# Patient Record
Sex: Female | Born: 1940
Health system: Southern US, Community
[De-identification: ages and names within clinical notes are randomized; demographics above are authoritative.]

## PROBLEM LIST (undated history)

## (undated) DIAGNOSIS — I639 Cerebral infarction, unspecified: Secondary | ICD-10-CM

## (undated) DIAGNOSIS — I10 Essential (primary) hypertension: Secondary | ICD-10-CM

## (undated) DIAGNOSIS — H269 Unspecified cataract: Secondary | ICD-10-CM

## (undated) DIAGNOSIS — I671 Cerebral aneurysm, nonruptured: Secondary | ICD-10-CM

## (undated) HISTORY — DX: Unspecified cataract: H26.9

## (undated) HISTORY — PX: ABDOMINAL HERNIA REPAIR: SHX539

## (undated) HISTORY — PX: CHOLECYSTECTOMY: SHX55

## (undated) HISTORY — PX: TOTAL ABDOMINAL HYSTERECTOMY: SHX209

## (undated) HISTORY — PX: REPLACEMENT TOTAL KNEE: SUR1224

---

## 2011-09-30 DIAGNOSIS — K8020 Calculus of gallbladder without cholecystitis without obstruction: Secondary | ICD-10-CM | POA: Diagnosis not present

## 2011-09-30 DIAGNOSIS — R112 Nausea with vomiting, unspecified: Secondary | ICD-10-CM | POA: Diagnosis not present

## 2011-09-30 DIAGNOSIS — R109 Unspecified abdominal pain: Secondary | ICD-10-CM | POA: Diagnosis not present

## 2011-09-30 DIAGNOSIS — I10 Essential (primary) hypertension: Secondary | ICD-10-CM | POA: Diagnosis not present

## 2011-09-30 DIAGNOSIS — R1011 Right upper quadrant pain: Secondary | ICD-10-CM | POA: Diagnosis not present

## 2011-10-04 DIAGNOSIS — G4700 Insomnia, unspecified: Secondary | ICD-10-CM | POA: Diagnosis not present

## 2011-10-04 DIAGNOSIS — M549 Dorsalgia, unspecified: Secondary | ICD-10-CM | POA: Diagnosis not present

## 2011-10-04 DIAGNOSIS — R109 Unspecified abdominal pain: Secondary | ICD-10-CM | POA: Diagnosis not present

## 2011-10-04 DIAGNOSIS — F329 Major depressive disorder, single episode, unspecified: Secondary | ICD-10-CM | POA: Diagnosis not present

## 2011-10-04 DIAGNOSIS — F3289 Other specified depressive episodes: Secondary | ICD-10-CM | POA: Diagnosis not present

## 2011-10-10 DIAGNOSIS — K811 Chronic cholecystitis: Secondary | ICD-10-CM | POA: Diagnosis not present

## 2011-10-10 DIAGNOSIS — K8020 Calculus of gallbladder without cholecystitis without obstruction: Secondary | ICD-10-CM | POA: Diagnosis not present

## 2011-10-21 DIAGNOSIS — K824 Cholesterolosis of gallbladder: Secondary | ICD-10-CM | POA: Diagnosis not present

## 2011-10-21 DIAGNOSIS — Z7982 Long term (current) use of aspirin: Secondary | ICD-10-CM | POA: Diagnosis not present

## 2011-10-21 DIAGNOSIS — Z90710 Acquired absence of both cervix and uterus: Secondary | ICD-10-CM | POA: Diagnosis not present

## 2011-10-21 DIAGNOSIS — Z96659 Presence of unspecified artificial knee joint: Secondary | ICD-10-CM | POA: Diagnosis not present

## 2011-10-21 DIAGNOSIS — K8000 Calculus of gallbladder with acute cholecystitis without obstruction: Secondary | ICD-10-CM | POA: Diagnosis not present

## 2011-10-21 DIAGNOSIS — K8020 Calculus of gallbladder without cholecystitis without obstruction: Secondary | ICD-10-CM | POA: Diagnosis not present

## 2011-10-21 DIAGNOSIS — K811 Chronic cholecystitis: Secondary | ICD-10-CM | POA: Diagnosis not present

## 2011-10-21 DIAGNOSIS — K8010 Calculus of gallbladder with chronic cholecystitis without obstruction: Secondary | ICD-10-CM | POA: Diagnosis not present

## 2011-10-21 DIAGNOSIS — I10 Essential (primary) hypertension: Secondary | ICD-10-CM | POA: Diagnosis not present

## 2011-12-05 DIAGNOSIS — M129 Arthropathy, unspecified: Secondary | ICD-10-CM | POA: Diagnosis not present

## 2011-12-05 DIAGNOSIS — M2550 Pain in unspecified joint: Secondary | ICD-10-CM | POA: Diagnosis not present

## 2011-12-05 DIAGNOSIS — N390 Urinary tract infection, site not specified: Secondary | ICD-10-CM | POA: Diagnosis not present

## 2011-12-05 DIAGNOSIS — I10 Essential (primary) hypertension: Secondary | ICD-10-CM | POA: Diagnosis not present

## 2011-12-05 DIAGNOSIS — E878 Other disorders of electrolyte and fluid balance, not elsewhere classified: Secondary | ICD-10-CM | POA: Diagnosis not present

## 2011-12-05 DIAGNOSIS — J069 Acute upper respiratory infection, unspecified: Secondary | ICD-10-CM | POA: Diagnosis not present

## 2011-12-05 DIAGNOSIS — E785 Hyperlipidemia, unspecified: Secondary | ICD-10-CM | POA: Diagnosis not present

## 2011-12-05 DIAGNOSIS — R5381 Other malaise: Secondary | ICD-10-CM | POA: Diagnosis not present

## 2011-12-05 DIAGNOSIS — R109 Unspecified abdominal pain: Secondary | ICD-10-CM | POA: Diagnosis not present

## 2011-12-09 DIAGNOSIS — R918 Other nonspecific abnormal finding of lung field: Secondary | ICD-10-CM | POA: Diagnosis not present

## 2011-12-09 DIAGNOSIS — Z09 Encounter for follow-up examination after completed treatment for conditions other than malignant neoplasm: Secondary | ICD-10-CM | POA: Diagnosis not present

## 2011-12-10 DIAGNOSIS — Z780 Asymptomatic menopausal state: Secondary | ICD-10-CM | POA: Diagnosis not present

## 2011-12-10 DIAGNOSIS — Z803 Family history of malignant neoplasm of breast: Secondary | ICD-10-CM | POA: Diagnosis not present

## 2011-12-10 DIAGNOSIS — Z920 Personal history of contraception: Secondary | ICD-10-CM | POA: Diagnosis not present

## 2011-12-10 DIAGNOSIS — Z1231 Encounter for screening mammogram for malignant neoplasm of breast: Secondary | ICD-10-CM | POA: Diagnosis not present

## 2011-12-12 DIAGNOSIS — L738 Other specified follicular disorders: Secondary | ICD-10-CM | POA: Diagnosis not present

## 2011-12-12 DIAGNOSIS — L219 Seborrheic dermatitis, unspecified: Secondary | ICD-10-CM | POA: Diagnosis not present

## 2011-12-26 DIAGNOSIS — L819 Disorder of pigmentation, unspecified: Secondary | ICD-10-CM | POA: Diagnosis not present

## 2011-12-26 DIAGNOSIS — L219 Seborrheic dermatitis, unspecified: Secondary | ICD-10-CM | POA: Diagnosis not present

## 2012-01-22 DIAGNOSIS — Z96659 Presence of unspecified artificial knee joint: Secondary | ICD-10-CM | POA: Diagnosis not present

## 2012-03-26 DIAGNOSIS — M129 Arthropathy, unspecified: Secondary | ICD-10-CM | POA: Diagnosis not present

## 2012-03-26 DIAGNOSIS — M2550 Pain in unspecified joint: Secondary | ICD-10-CM | POA: Diagnosis not present

## 2012-03-26 DIAGNOSIS — R5383 Other fatigue: Secondary | ICD-10-CM | POA: Diagnosis not present

## 2012-03-26 DIAGNOSIS — I10 Essential (primary) hypertension: Secondary | ICD-10-CM | POA: Diagnosis not present

## 2012-03-26 DIAGNOSIS — R5381 Other malaise: Secondary | ICD-10-CM | POA: Diagnosis not present

## 2012-03-26 DIAGNOSIS — R109 Unspecified abdominal pain: Secondary | ICD-10-CM | POA: Diagnosis not present

## 2012-03-26 DIAGNOSIS — E878 Other disorders of electrolyte and fluid balance, not elsewhere classified: Secondary | ICD-10-CM | POA: Diagnosis not present

## 2012-03-26 DIAGNOSIS — E785 Hyperlipidemia, unspecified: Secondary | ICD-10-CM | POA: Diagnosis not present

## 2012-03-26 DIAGNOSIS — N390 Urinary tract infection, site not specified: Secondary | ICD-10-CM | POA: Diagnosis not present

## 2012-03-28 DIAGNOSIS — I609 Nontraumatic subarachnoid hemorrhage, unspecified: Secondary | ICD-10-CM | POA: Diagnosis not present

## 2012-03-28 DIAGNOSIS — Z96659 Presence of unspecified artificial knee joint: Secondary | ICD-10-CM | POA: Diagnosis not present

## 2012-03-28 DIAGNOSIS — Z602 Problems related to living alone: Secondary | ICD-10-CM | POA: Diagnosis not present

## 2012-03-28 DIAGNOSIS — S1093XA Contusion of unspecified part of neck, initial encounter: Secondary | ICD-10-CM | POA: Diagnosis not present

## 2012-03-28 DIAGNOSIS — F05 Delirium due to known physiological condition: Secondary | ICD-10-CM | POA: Diagnosis not present

## 2012-03-28 DIAGNOSIS — Z8249 Family history of ischemic heart disease and other diseases of the circulatory system: Secondary | ICD-10-CM | POA: Diagnosis not present

## 2012-03-28 DIAGNOSIS — Z801 Family history of malignant neoplasm of trachea, bronchus and lung: Secondary | ICD-10-CM | POA: Diagnosis not present

## 2012-03-28 DIAGNOSIS — I679 Cerebrovascular disease, unspecified: Secondary | ICD-10-CM | POA: Diagnosis not present

## 2012-03-28 DIAGNOSIS — I471 Supraventricular tachycardia: Secondary | ICD-10-CM | POA: Diagnosis not present

## 2012-03-28 DIAGNOSIS — Z7982 Long term (current) use of aspirin: Secondary | ICD-10-CM | POA: Diagnosis not present

## 2012-03-28 DIAGNOSIS — S066X9A Traumatic subarachnoid hemorrhage with loss of consciousness of unspecified duration, initial encounter: Secondary | ICD-10-CM | POA: Diagnosis not present

## 2012-03-28 DIAGNOSIS — I6200 Nontraumatic subdural hemorrhage, unspecified: Secondary | ICD-10-CM | POA: Diagnosis not present

## 2012-03-28 DIAGNOSIS — Z806 Family history of leukemia: Secondary | ICD-10-CM | POA: Diagnosis not present

## 2012-03-28 DIAGNOSIS — I10 Essential (primary) hypertension: Secondary | ICD-10-CM | POA: Diagnosis not present

## 2012-03-28 DIAGNOSIS — S069XAA Unspecified intracranial injury with loss of consciousness status unknown, initial encounter: Secondary | ICD-10-CM | POA: Diagnosis not present

## 2012-03-28 DIAGNOSIS — S0003XA Contusion of scalp, initial encounter: Secondary | ICD-10-CM | POA: Diagnosis not present

## 2012-03-28 DIAGNOSIS — R930 Abnormal findings on diagnostic imaging of skull and head, not elsewhere classified: Secondary | ICD-10-CM | POA: Diagnosis not present

## 2012-03-28 DIAGNOSIS — Z8673 Personal history of transient ischemic attack (TIA), and cerebral infarction without residual deficits: Secondary | ICD-10-CM | POA: Diagnosis not present

## 2012-03-28 DIAGNOSIS — R55 Syncope and collapse: Secondary | ICD-10-CM | POA: Diagnosis not present

## 2012-03-28 DIAGNOSIS — I671 Cerebral aneurysm, nonruptured: Secondary | ICD-10-CM | POA: Diagnosis not present

## 2012-03-28 DIAGNOSIS — Z800 Family history of malignant neoplasm of digestive organs: Secondary | ICD-10-CM | POA: Diagnosis not present

## 2012-03-28 DIAGNOSIS — Z803 Family history of malignant neoplasm of breast: Secondary | ICD-10-CM | POA: Diagnosis not present

## 2012-03-28 DIAGNOSIS — E785 Hyperlipidemia, unspecified: Secondary | ICD-10-CM | POA: Diagnosis not present

## 2012-03-28 DIAGNOSIS — R4182 Altered mental status, unspecified: Secondary | ICD-10-CM | POA: Diagnosis not present

## 2012-03-28 DIAGNOSIS — Z79899 Other long term (current) drug therapy: Secondary | ICD-10-CM | POA: Diagnosis not present

## 2012-03-28 DIAGNOSIS — G44309 Post-traumatic headache, unspecified, not intractable: Secondary | ICD-10-CM | POA: Diagnosis not present

## 2012-03-28 DIAGNOSIS — W19XXXA Unspecified fall, initial encounter: Secondary | ICD-10-CM | POA: Diagnosis not present

## 2012-03-28 DIAGNOSIS — I629 Nontraumatic intracranial hemorrhage, unspecified: Secondary | ICD-10-CM | POA: Diagnosis not present

## 2012-03-28 DIAGNOSIS — M1990 Unspecified osteoarthritis, unspecified site: Secondary | ICD-10-CM | POA: Diagnosis not present

## 2012-03-28 DIAGNOSIS — S069X9A Unspecified intracranial injury with loss of consciousness of unspecified duration, initial encounter: Secondary | ICD-10-CM | POA: Diagnosis not present

## 2012-03-28 DIAGNOSIS — J9819 Other pulmonary collapse: Secondary | ICD-10-CM | POA: Diagnosis not present

## 2012-04-09 DIAGNOSIS — I609 Nontraumatic subarachnoid hemorrhage, unspecified: Secondary | ICD-10-CM | POA: Diagnosis not present

## 2012-04-09 DIAGNOSIS — R55 Syncope and collapse: Secondary | ICD-10-CM | POA: Diagnosis not present

## 2012-04-16 DIAGNOSIS — I671 Cerebral aneurysm, nonruptured: Secondary | ICD-10-CM | POA: Diagnosis not present

## 2012-04-16 DIAGNOSIS — Z8679 Personal history of other diseases of the circulatory system: Secondary | ICD-10-CM | POA: Diagnosis not present

## 2012-04-30 DIAGNOSIS — D649 Anemia, unspecified: Secondary | ICD-10-CM | POA: Diagnosis not present

## 2012-04-30 DIAGNOSIS — I10 Essential (primary) hypertension: Secondary | ICD-10-CM | POA: Diagnosis not present

## 2012-04-30 DIAGNOSIS — R109 Unspecified abdominal pain: Secondary | ICD-10-CM | POA: Diagnosis not present

## 2012-04-30 DIAGNOSIS — Z23 Encounter for immunization: Secondary | ICD-10-CM | POA: Diagnosis not present

## 2012-04-30 DIAGNOSIS — E785 Hyperlipidemia, unspecified: Secondary | ICD-10-CM | POA: Diagnosis not present

## 2012-05-05 DIAGNOSIS — Z87898 Personal history of other specified conditions: Secondary | ICD-10-CM | POA: Diagnosis not present

## 2012-05-13 DIAGNOSIS — H43819 Vitreous degeneration, unspecified eye: Secondary | ICD-10-CM | POA: Diagnosis not present

## 2012-05-13 DIAGNOSIS — H2510 Age-related nuclear cataract, unspecified eye: Secondary | ICD-10-CM | POA: Diagnosis not present

## 2012-05-13 DIAGNOSIS — H01029 Squamous blepharitis unspecified eye, unspecified eyelid: Secondary | ICD-10-CM | POA: Diagnosis not present

## 2012-06-05 DIAGNOSIS — Z87898 Personal history of other specified conditions: Secondary | ICD-10-CM | POA: Diagnosis not present

## 2012-07-05 DIAGNOSIS — Z87898 Personal history of other specified conditions: Secondary | ICD-10-CM | POA: Diagnosis not present

## 2012-08-05 DIAGNOSIS — Z87898 Personal history of other specified conditions: Secondary | ICD-10-CM | POA: Diagnosis not present

## 2012-09-05 DIAGNOSIS — Z87898 Personal history of other specified conditions: Secondary | ICD-10-CM | POA: Diagnosis not present

## 2012-10-03 DIAGNOSIS — Z87898 Personal history of other specified conditions: Secondary | ICD-10-CM | POA: Diagnosis not present

## 2012-10-27 DIAGNOSIS — I671 Cerebral aneurysm, nonruptured: Secondary | ICD-10-CM | POA: Diagnosis not present

## 2012-10-27 DIAGNOSIS — N289 Disorder of kidney and ureter, unspecified: Secondary | ICD-10-CM | POA: Diagnosis not present

## 2012-10-27 DIAGNOSIS — I6789 Other cerebrovascular disease: Secondary | ICD-10-CM | POA: Diagnosis not present

## 2012-10-30 DIAGNOSIS — R930 Abnormal findings on diagnostic imaging of skull and head, not elsewhere classified: Secondary | ICD-10-CM | POA: Diagnosis not present

## 2012-10-30 DIAGNOSIS — I671 Cerebral aneurysm, nonruptured: Secondary | ICD-10-CM | POA: Diagnosis not present

## 2012-11-03 DIAGNOSIS — Z87898 Personal history of other specified conditions: Secondary | ICD-10-CM | POA: Diagnosis not present

## 2012-12-03 DIAGNOSIS — Z87898 Personal history of other specified conditions: Secondary | ICD-10-CM | POA: Diagnosis not present

## 2013-01-03 DIAGNOSIS — Z87898 Personal history of other specified conditions: Secondary | ICD-10-CM | POA: Diagnosis not present

## 2013-01-05 DIAGNOSIS — Z780 Asymptomatic menopausal state: Secondary | ICD-10-CM | POA: Diagnosis not present

## 2013-01-05 DIAGNOSIS — Z920 Personal history of contraception: Secondary | ICD-10-CM | POA: Diagnosis not present

## 2013-01-05 DIAGNOSIS — Z803 Family history of malignant neoplasm of breast: Secondary | ICD-10-CM | POA: Diagnosis not present

## 2013-01-05 DIAGNOSIS — Z1231 Encounter for screening mammogram for malignant neoplasm of breast: Secondary | ICD-10-CM | POA: Diagnosis not present

## 2013-01-22 LAB — HM MAMMOGRAPHY: HM MAMMO: NORMAL

## 2013-02-02 DIAGNOSIS — Z87898 Personal history of other specified conditions: Secondary | ICD-10-CM | POA: Diagnosis not present

## 2013-02-24 DIAGNOSIS — L919 Hypertrophic disorder of the skin, unspecified: Secondary | ICD-10-CM | POA: Diagnosis not present

## 2013-02-24 DIAGNOSIS — E785 Hyperlipidemia, unspecified: Secondary | ICD-10-CM | POA: Diagnosis not present

## 2013-02-24 DIAGNOSIS — I10 Essential (primary) hypertension: Secondary | ICD-10-CM | POA: Diagnosis not present

## 2013-02-24 DIAGNOSIS — M2550 Pain in unspecified joint: Secondary | ICD-10-CM | POA: Diagnosis not present

## 2013-02-24 DIAGNOSIS — R109 Unspecified abdominal pain: Secondary | ICD-10-CM | POA: Diagnosis not present

## 2013-02-24 DIAGNOSIS — L909 Atrophic disorder of skin, unspecified: Secondary | ICD-10-CM | POA: Diagnosis not present

## 2013-02-25 DIAGNOSIS — I10 Essential (primary) hypertension: Secondary | ICD-10-CM | POA: Diagnosis not present

## 2013-02-25 DIAGNOSIS — R609 Edema, unspecified: Secondary | ICD-10-CM | POA: Diagnosis not present

## 2013-02-25 DIAGNOSIS — D649 Anemia, unspecified: Secondary | ICD-10-CM | POA: Diagnosis not present

## 2013-02-25 DIAGNOSIS — E785 Hyperlipidemia, unspecified: Secondary | ICD-10-CM | POA: Diagnosis not present

## 2013-02-25 DIAGNOSIS — M79609 Pain in unspecified limb: Secondary | ICD-10-CM | POA: Diagnosis not present

## 2013-02-25 DIAGNOSIS — R109 Unspecified abdominal pain: Secondary | ICD-10-CM | POA: Diagnosis not present

## 2013-02-25 DIAGNOSIS — N390 Urinary tract infection, site not specified: Secondary | ICD-10-CM | POA: Diagnosis not present

## 2013-02-25 DIAGNOSIS — R5381 Other malaise: Secondary | ICD-10-CM | POA: Diagnosis not present

## 2013-03-05 DIAGNOSIS — Z87898 Personal history of other specified conditions: Secondary | ICD-10-CM | POA: Diagnosis not present

## 2013-03-09 DIAGNOSIS — K439 Ventral hernia without obstruction or gangrene: Secondary | ICD-10-CM | POA: Diagnosis not present

## 2013-03-11 DIAGNOSIS — E119 Type 2 diabetes mellitus without complications: Secondary | ICD-10-CM | POA: Diagnosis not present

## 2013-03-17 DIAGNOSIS — Z87820 Personal history of traumatic brain injury: Secondary | ICD-10-CM | POA: Diagnosis not present

## 2013-03-17 DIAGNOSIS — Z9089 Acquired absence of other organs: Secondary | ICD-10-CM | POA: Diagnosis not present

## 2013-03-17 DIAGNOSIS — M159 Polyosteoarthritis, unspecified: Secondary | ICD-10-CM | POA: Diagnosis not present

## 2013-03-17 DIAGNOSIS — Z809 Family history of malignant neoplasm, unspecified: Secondary | ICD-10-CM | POA: Diagnosis not present

## 2013-03-17 DIAGNOSIS — E785 Hyperlipidemia, unspecified: Secondary | ICD-10-CM | POA: Diagnosis not present

## 2013-03-17 DIAGNOSIS — Z806 Family history of leukemia: Secondary | ICD-10-CM | POA: Diagnosis not present

## 2013-03-17 DIAGNOSIS — K432 Incisional hernia without obstruction or gangrene: Secondary | ICD-10-CM | POA: Diagnosis not present

## 2013-03-17 DIAGNOSIS — K439 Ventral hernia without obstruction or gangrene: Secondary | ICD-10-CM | POA: Diagnosis not present

## 2013-03-17 DIAGNOSIS — Z96659 Presence of unspecified artificial knee joint: Secondary | ICD-10-CM | POA: Diagnosis not present

## 2013-03-17 DIAGNOSIS — Z800 Family history of malignant neoplasm of digestive organs: Secondary | ICD-10-CM | POA: Diagnosis not present

## 2013-03-17 DIAGNOSIS — Z79899 Other long term (current) drug therapy: Secondary | ICD-10-CM | POA: Diagnosis not present

## 2013-03-17 DIAGNOSIS — E7800 Pure hypercholesterolemia, unspecified: Secondary | ICD-10-CM | POA: Diagnosis not present

## 2013-03-17 DIAGNOSIS — Z8249 Family history of ischemic heart disease and other diseases of the circulatory system: Secondary | ICD-10-CM | POA: Diagnosis not present

## 2013-03-17 DIAGNOSIS — G8918 Other acute postprocedural pain: Secondary | ICD-10-CM | POA: Diagnosis not present

## 2013-03-17 DIAGNOSIS — M1990 Unspecified osteoarthritis, unspecified site: Secondary | ICD-10-CM | POA: Diagnosis not present

## 2013-03-17 DIAGNOSIS — R109 Unspecified abdominal pain: Secondary | ICD-10-CM | POA: Diagnosis not present

## 2013-03-17 DIAGNOSIS — I10 Essential (primary) hypertension: Secondary | ICD-10-CM | POA: Diagnosis not present

## 2013-03-20 DIAGNOSIS — I10 Essential (primary) hypertension: Secondary | ICD-10-CM | POA: Diagnosis not present

## 2013-03-20 DIAGNOSIS — Z48815 Encounter for surgical aftercare following surgery on the digestive system: Secondary | ICD-10-CM | POA: Diagnosis not present

## 2013-03-22 DIAGNOSIS — Z48815 Encounter for surgical aftercare following surgery on the digestive system: Secondary | ICD-10-CM | POA: Diagnosis not present

## 2013-03-22 DIAGNOSIS — I10 Essential (primary) hypertension: Secondary | ICD-10-CM | POA: Diagnosis not present

## 2013-03-25 DIAGNOSIS — Z48815 Encounter for surgical aftercare following surgery on the digestive system: Secondary | ICD-10-CM | POA: Diagnosis not present

## 2013-03-25 DIAGNOSIS — I10 Essential (primary) hypertension: Secondary | ICD-10-CM | POA: Diagnosis not present

## 2013-03-31 DIAGNOSIS — Z48815 Encounter for surgical aftercare following surgery on the digestive system: Secondary | ICD-10-CM | POA: Diagnosis not present

## 2013-03-31 DIAGNOSIS — I10 Essential (primary) hypertension: Secondary | ICD-10-CM | POA: Diagnosis not present

## 2013-04-05 DIAGNOSIS — Z87898 Personal history of other specified conditions: Secondary | ICD-10-CM | POA: Diagnosis not present

## 2013-04-07 DIAGNOSIS — I10 Essential (primary) hypertension: Secondary | ICD-10-CM | POA: Diagnosis not present

## 2013-04-07 DIAGNOSIS — Z48815 Encounter for surgical aftercare following surgery on the digestive system: Secondary | ICD-10-CM | POA: Diagnosis not present

## 2013-04-15 DIAGNOSIS — I10 Essential (primary) hypertension: Secondary | ICD-10-CM | POA: Diagnosis not present

## 2013-04-15 DIAGNOSIS — Z48815 Encounter for surgical aftercare following surgery on the digestive system: Secondary | ICD-10-CM | POA: Diagnosis not present

## 2013-05-05 DIAGNOSIS — Z87898 Personal history of other specified conditions: Secondary | ICD-10-CM | POA: Diagnosis not present

## 2013-05-17 DIAGNOSIS — M2550 Pain in unspecified joint: Secondary | ICD-10-CM | POA: Diagnosis not present

## 2013-05-17 DIAGNOSIS — I10 Essential (primary) hypertension: Secondary | ICD-10-CM | POA: Diagnosis not present

## 2013-05-17 DIAGNOSIS — R42 Dizziness and giddiness: Secondary | ICD-10-CM | POA: Diagnosis not present

## 2013-05-17 DIAGNOSIS — Z23 Encounter for immunization: Secondary | ICD-10-CM | POA: Diagnosis not present

## 2013-05-17 DIAGNOSIS — R109 Unspecified abdominal pain: Secondary | ICD-10-CM | POA: Diagnosis not present

## 2013-05-19 DIAGNOSIS — Z96659 Presence of unspecified artificial knee joint: Secondary | ICD-10-CM | POA: Diagnosis not present

## 2013-05-19 DIAGNOSIS — Z471 Aftercare following joint replacement surgery: Secondary | ICD-10-CM | POA: Diagnosis not present

## 2013-06-05 DIAGNOSIS — Z87898 Personal history of other specified conditions: Secondary | ICD-10-CM | POA: Diagnosis not present

## 2013-07-05 DIAGNOSIS — Z87898 Personal history of other specified conditions: Secondary | ICD-10-CM | POA: Diagnosis not present

## 2013-08-05 DIAGNOSIS — Z87898 Personal history of other specified conditions: Secondary | ICD-10-CM | POA: Diagnosis not present

## 2013-09-05 DIAGNOSIS — Z87898 Personal history of other specified conditions: Secondary | ICD-10-CM | POA: Diagnosis not present

## 2013-10-03 DIAGNOSIS — Z87898 Personal history of other specified conditions: Secondary | ICD-10-CM | POA: Diagnosis not present

## 2013-11-03 DIAGNOSIS — Z87898 Personal history of other specified conditions: Secondary | ICD-10-CM | POA: Diagnosis not present

## 2013-11-08 DIAGNOSIS — F4322 Adjustment disorder with anxiety: Secondary | ICD-10-CM | POA: Diagnosis not present

## 2013-11-12 DIAGNOSIS — I671 Cerebral aneurysm, nonruptured: Secondary | ICD-10-CM | POA: Diagnosis not present

## 2013-11-12 DIAGNOSIS — I10 Essential (primary) hypertension: Secondary | ICD-10-CM | POA: Diagnosis not present

## 2013-12-03 DIAGNOSIS — Z87898 Personal history of other specified conditions: Secondary | ICD-10-CM | POA: Diagnosis not present

## 2014-01-03 DIAGNOSIS — Z87898 Personal history of other specified conditions: Secondary | ICD-10-CM | POA: Diagnosis not present

## 2014-01-12 DIAGNOSIS — G4700 Insomnia, unspecified: Secondary | ICD-10-CM | POA: Diagnosis not present

## 2014-01-12 DIAGNOSIS — R21 Rash and other nonspecific skin eruption: Secondary | ICD-10-CM | POA: Diagnosis not present

## 2014-01-12 DIAGNOSIS — I10 Essential (primary) hypertension: Secondary | ICD-10-CM | POA: Diagnosis not present

## 2014-03-30 DIAGNOSIS — I10 Essential (primary) hypertension: Secondary | ICD-10-CM | POA: Diagnosis not present

## 2014-03-30 DIAGNOSIS — R21 Rash and other nonspecific skin eruption: Secondary | ICD-10-CM | POA: Diagnosis not present

## 2014-03-30 DIAGNOSIS — H109 Unspecified conjunctivitis: Secondary | ICD-10-CM | POA: Diagnosis not present

## 2014-06-22 DIAGNOSIS — Z23 Encounter for immunization: Secondary | ICD-10-CM | POA: Diagnosis not present

## 2014-12-22 DIAGNOSIS — Z1211 Encounter for screening for malignant neoplasm of colon: Secondary | ICD-10-CM | POA: Diagnosis not present

## 2014-12-22 DIAGNOSIS — Z23 Encounter for immunization: Secondary | ICD-10-CM | POA: Diagnosis not present

## 2014-12-22 DIAGNOSIS — E785 Hyperlipidemia, unspecified: Secondary | ICD-10-CM | POA: Diagnosis not present

## 2014-12-22 DIAGNOSIS — I10 Essential (primary) hypertension: Secondary | ICD-10-CM | POA: Diagnosis not present

## 2014-12-22 DIAGNOSIS — R21 Rash and other nonspecific skin eruption: Secondary | ICD-10-CM | POA: Diagnosis not present

## 2014-12-22 DIAGNOSIS — Z0000 Encounter for general adult medical examination without abnormal findings: Secondary | ICD-10-CM | POA: Diagnosis not present

## 2014-12-22 DIAGNOSIS — F5101 Primary insomnia: Secondary | ICD-10-CM | POA: Diagnosis not present

## 2014-12-22 LAB — LIPID PANEL
Cholesterol: 241 mg/dL — AB (ref 0–200)
HDL: 67 mg/dL (ref 35–70)
LDL CALC: 156 mg/dL
Triglycerides: 88 mg/dL (ref 40–160)

## 2014-12-22 LAB — TSH: TSH: 1.48 u[IU]/mL (ref <=5.90)

## 2014-12-22 LAB — BASIC METABOLIC PANEL
BUN: 15 mg/dL (ref 4–21)
Creatinine: 0.6 mg/dL (ref <=1.1)

## 2015-01-04 ENCOUNTER — Other Ambulatory Visit: Payer: Self-pay | Admitting: Internal Medicine

## 2015-01-04 ENCOUNTER — Telehealth: Payer: Self-pay

## 2015-01-04 DIAGNOSIS — Z1211 Encounter for screening for malignant neoplasm of colon: Secondary | ICD-10-CM | POA: Insufficient documentation

## 2015-01-04 DIAGNOSIS — I671 Cerebral aneurysm, nonruptured: Secondary | ICD-10-CM | POA: Insufficient documentation

## 2015-01-04 DIAGNOSIS — E782 Mixed hyperlipidemia: Secondary | ICD-10-CM | POA: Insufficient documentation

## 2015-01-04 DIAGNOSIS — Z23 Encounter for immunization: Secondary | ICD-10-CM | POA: Insufficient documentation

## 2015-01-04 DIAGNOSIS — I10 Essential (primary) hypertension: Secondary | ICD-10-CM | POA: Insufficient documentation

## 2015-01-04 DIAGNOSIS — E785 Hyperlipidemia, unspecified: Secondary | ICD-10-CM

## 2015-01-04 DIAGNOSIS — Z1231 Encounter for screening mammogram for malignant neoplasm of breast: Secondary | ICD-10-CM

## 2015-01-04 DIAGNOSIS — F5101 Primary insomnia: Secondary | ICD-10-CM | POA: Insufficient documentation

## 2015-01-04 DIAGNOSIS — Z0000 Encounter for general adult medical examination without abnormal findings: Secondary | ICD-10-CM | POA: Insufficient documentation

## 2015-01-04 DIAGNOSIS — R21 Rash and other nonspecific skin eruption: Secondary | ICD-10-CM | POA: Insufficient documentation

## 2015-01-04 MED ORDER — ATORVASTATIN CALCIUM 10 MG PO TABS: 10.0000 mg | ORAL_TABLET | Freq: Every day | ORAL | Status: DC

## 2015-01-04 NOTE — Telephone Encounter (Signed)
Needs you to send Lipitor to pharmacy as you suggested. [facility] [place]

## 2015-01-11 ENCOUNTER — Ambulatory Visit
Admission: RE | Admit: 2015-01-11 | Discharge: 2015-01-11 | Disposition: A | Discharge status: Home / Self Care | Payer: Medicare Other | Source: Ambulatory Visit | Attending: Internal Medicine | Admitting: Internal Medicine

## 2015-01-11 DIAGNOSIS — Z1231 Encounter for screening mammogram for malignant neoplasm of breast: Secondary | ICD-10-CM | POA: Diagnosis not present

## 2015-03-31 DIAGNOSIS — H40003 Preglaucoma, unspecified, bilateral: Secondary | ICD-10-CM | POA: Diagnosis not present

## 2015-04-12 DIAGNOSIS — B880 Other acariasis: Secondary | ICD-10-CM | POA: Diagnosis not present

## 2015-04-12 DIAGNOSIS — L718 Other rosacea: Secondary | ICD-10-CM | POA: Diagnosis not present

## 2015-05-25 ENCOUNTER — Telehealth: Payer: Self-pay

## 2015-05-25 NOTE — Telephone Encounter (Signed)
done

## 2015-05-25 NOTE — Telephone Encounter (Deleted)
done

## 2015-06-06 ENCOUNTER — Encounter: Payer: Self-pay | Admitting: Internal Medicine

## 2015-06-06 ENCOUNTER — Ambulatory Visit (INDEPENDENT_AMBULATORY_CARE_PROVIDER_SITE_OTHER): Payer: Medicare Other | Admitting: Internal Medicine

## 2015-06-06 VITALS — BP 130/60 | HR 76 | Ht 63.0 in | Wt 184.0 lb

## 2015-06-06 DIAGNOSIS — Z23 Encounter for immunization: Secondary | ICD-10-CM | POA: Diagnosis not present

## 2015-06-06 DIAGNOSIS — I10 Essential (primary) hypertension: Secondary | ICD-10-CM

## 2015-06-06 DIAGNOSIS — R350 Frequency of micturition: Secondary | ICD-10-CM | POA: Diagnosis not present

## 2015-06-06 DIAGNOSIS — I671 Cerebral aneurysm, nonruptured: Secondary | ICD-10-CM

## 2015-06-06 NOTE — Progress Notes (Signed)
Date:  06/06/2015   Name:  Bonnie Jackson   DOB:  1941-07-07   MRN:  [id number]   Chief Complaint: No chief complaint on file.  Patient complains of urination at night. She wakes up multiple times and has to urinate. She claims that there is a large volume of urine at each visit to the restroom. During the day her urination is normal. She denies leakage, urge incontinence, dysuria or hematuria. She does not take any diuretics. She reduces her drinking after 6 PM. On further discussion she has poor sleep hygiene. She sleeps with the television on all night. She goes to bed with a book as well as her smart phone. When she wakes up in the middle of the night she goes to the bathroom regardless of the urge to urinate. She will also watch TV or look at her phone as well.   Review of Systems  Constitutional: Negative for fever, chills and fatigue.  Respiratory: Negative for shortness of breath.   Cardiovascular: Negative for chest pain and leg swelling.  Gastrointestinal: Negative for abdominal pain and diarrhea.  Genitourinary: Positive for frequency. Negative for dysuria, urgency, hematuria, flank pain, difficulty urinating and pelvic pain.  Musculoskeletal: Negative for arthralgias.  Neurological: Negative for dizziness, tremors, weakness, numbness and headaches.  Psychiatric/Behavioral: Positive for sleep disturbance. Negative for dysphoric mood.    Patient Active Problem List   Diagnosis Date Noted  . Aneurysm, cerebral, nonruptured 01/04/2015  . Essential (primary) hypertension 01/04/2015  . HLD (hyperlipidemia) 01/04/2015  . Routine general medical examination at a health care facility 01/04/2015  . Need for vaccination 01/04/2015  . Idiopathic insomnia 01/04/2015  . Cutaneous eruption 01/04/2015  . Special screening for malignant neoplasms, colon 01/04/2015    Prior to Admission medications   Medication Sig Start Date End Date Taking? Authorizing Provider  aspirin 81 MG tablet Take 1  tablet by mouth daily.   Yes Historical Provider, MD  lisinopril-hydrochlorothiazide (PRINZIDE,ZESTORETIC) 10-12.5 MG per tablet Take 1 tablet by mouth daily. 12/22/14  Yes Historical Provider, MD  metroNIDAZOLE (METROGEL) 1 % gel APPLY TO ALL AFFECTED AREAS QD 04/14/15  Yes Historical Provider, MD  traZODone (DESYREL) 50 MG tablet  06/04/15  Yes Historical Provider, MD  atorvastatin (LIPITOR) 10 MG tablet Take 1 tablet (10 mg total) by mouth at bedtime. Patient not taking: Reported on 06/06/2015 01/04/15   Glean Hess, MD    No Known Allergies  Past Surgical History  Procedure Laterality Date  . Abdominal hernia repair    . Total abdominal hysterectomy    . Cholecystectomy    . Replacement total knee Bilateral     Social History  Substance Use Topics  . Smoking status: Never Smoker   . Smokeless tobacco: None  . Alcohol Use: 0.0 oz/week    0 Standard drinks or equivalent per week     Comment: occasional     Medication list has been reviewed and updated.   Physical Exam  Constitutional: She is oriented to person, place, and time. She appears well-developed and well-nourished.  Cardiovascular: Normal rate, regular rhythm and normal heart sounds.   Pulmonary/Chest: Effort normal and breath sounds normal.  Abdominal: Soft. Bowel sounds are normal. There is no tenderness.  Musculoskeletal: She exhibits no edema.  Neurological: She is alert and oriented to person, place, and time.  Skin: Skin is warm and dry.  Psychiatric: She has a normal mood and affect. Her behavior is normal.  Nursing note and vitals reviewed.  BP 130/60 mmHg  Pulse 76  Ht 5\' 3"  (1.6 m)  Wt 184 lb (83.462 kg)  BMI 32.60 kg/m2  Assessment and Plan: 1. Urinary frequency Symptoms likely exacerbated by sleep habits Patient educated regarding good sleep hygiene measures  2. Essential (primary) hypertension Controlled on current regimen  3. Aneurysm, cerebral, nonruptured Asymptomatic - did not  recommend further imaging unless symptoms occur due to non-operable state  4. Flu vaccine need - Flu Vaccine QUAD 36+ mos PF IM (Fluarix & Fluzone Quad PF)   Halina Maidens, MD [facility]  06/06/2015

## 2015-11-16 ENCOUNTER — Inpatient Hospital Stay
Admit: 2015-11-16 | Discharge: 2015-11-16 | Disposition: A | Discharge status: Home / Self Care | Payer: Medicare Other | Attending: Internal Medicine | Admitting: Internal Medicine

## 2015-11-16 ENCOUNTER — Encounter: Payer: Self-pay | Admitting: Emergency Medicine

## 2015-11-16 ENCOUNTER — Emergency Department: Payer: Medicare Other

## 2015-11-16 ENCOUNTER — Inpatient Hospital Stay: Payer: Medicare Other

## 2015-11-16 ENCOUNTER — Inpatient Hospital Stay
Admission: EM | Admit: 2015-11-16 | Discharge: 2015-11-17 | DRG: 065 | Disposition: A | Discharge status: Home / Self Care | Payer: Medicare Other | Attending: Internal Medicine | Admitting: Internal Medicine

## 2015-11-16 DIAGNOSIS — E785 Hyperlipidemia, unspecified: Secondary | ICD-10-CM | POA: Diagnosis not present

## 2015-11-16 DIAGNOSIS — I638 Other cerebral infarction: Secondary | ICD-10-CM | POA: Diagnosis present

## 2015-11-16 DIAGNOSIS — I639 Cerebral infarction, unspecified: Secondary | ICD-10-CM | POA: Diagnosis not present

## 2015-11-16 DIAGNOSIS — I10 Essential (primary) hypertension: Secondary | ICD-10-CM | POA: Diagnosis not present

## 2015-11-16 DIAGNOSIS — Z7982 Long term (current) use of aspirin: Secondary | ICD-10-CM

## 2015-11-16 DIAGNOSIS — I671 Cerebral aneurysm, nonruptured: Secondary | ICD-10-CM | POA: Diagnosis present

## 2015-11-16 DIAGNOSIS — Z79899 Other long term (current) drug therapy: Secondary | ICD-10-CM | POA: Diagnosis not present

## 2015-11-16 DIAGNOSIS — G8194 Hemiplegia, unspecified affecting left nondominant side: Secondary | ICD-10-CM | POA: Diagnosis present

## 2015-11-16 DIAGNOSIS — M6281 Muscle weakness (generalized): Secondary | ICD-10-CM | POA: Diagnosis not present

## 2015-11-16 DIAGNOSIS — Z803 Family history of malignant neoplasm of breast: Secondary | ICD-10-CM | POA: Diagnosis not present

## 2015-11-16 DIAGNOSIS — I6381 Other cerebral infarction due to occlusion or stenosis of small artery: Secondary | ICD-10-CM | POA: Diagnosis present

## 2015-11-16 DIAGNOSIS — R29898 Other symptoms and signs involving the musculoskeletal system: Secondary | ICD-10-CM

## 2015-11-16 DIAGNOSIS — I6523 Occlusion and stenosis of bilateral carotid arteries: Secondary | ICD-10-CM | POA: Diagnosis not present

## 2015-11-16 DIAGNOSIS — Z96653 Presence of artificial knee joint, bilateral: Secondary | ICD-10-CM | POA: Diagnosis present

## 2015-11-16 DIAGNOSIS — I63432 Cerebral infarction due to embolism of left posterior cerebral artery: Secondary | ICD-10-CM | POA: Diagnosis not present

## 2015-11-16 DIAGNOSIS — R531 Weakness: Secondary | ICD-10-CM | POA: Diagnosis not present

## 2015-11-16 HISTORY — DX: Cerebral aneurysm, nonruptured: I67.1

## 2015-11-16 HISTORY — DX: Essential (primary) hypertension: I10

## 2015-11-16 LAB — DIFFERENTIAL
BASOS PCT: 1 %
Basophils Absolute: 0.1 10*3/uL (ref 0–0.1)
EOS ABS: 0.1 10*3/uL (ref 0–0.7)
Eosinophils Relative: 2 %
Lymphocytes Relative: 20 %
Lymphs Abs: 1.2 10*3/uL (ref 1.0–3.6)
MONO ABS: 0.7 10*3/uL (ref 0.2–0.9)
Monocytes Relative: 12 %
NEUTROS PCT: 65 %
Neutro Abs: 4.2 10*3/uL (ref 1.4–6.5)

## 2015-11-16 LAB — COMPREHENSIVE METABOLIC PANEL
ALT: 23 U/L (ref 14–54)
AST: 23 U/L (ref 15–41)
Albumin: 4.7 g/dL (ref 3.5–5.0)
Alkaline Phosphatase: 53 U/L (ref 38–126)
Anion gap: 7 (ref 5–15)
BUN: 19 mg/dL (ref 6–20)
CHLORIDE: 103 mmol/L (ref 101–111)
CO2: 24 mmol/L (ref 22–32)
CREATININE: 0.68 mg/dL (ref 0.44–1.00)
Calcium: 10.2 mg/dL (ref 8.9–10.3)
GFR calc non Af Amer: >60 mL/min (ref >60)
Glucose, Bld: 126 mg/dL — ABNORMAL HIGH (ref 65–99)
POTASSIUM: 3.7 mmol/L (ref 3.5–5.1)
Sodium: 134 mmol/L — ABNORMAL LOW (ref 135–145)
Total Bilirubin: 0.4 mg/dL (ref 0.3–1.2)
Total Protein: 7.6 g/dL (ref 6.5–8.1)

## 2015-11-16 LAB — CBC
HEMATOCRIT: 43.5 % (ref 35.0–47.0)
HEMOGLOBIN: 14.9 g/dL (ref 12.0–16.0)
MCH: 32.1 pg (ref 26.0–34.0)
MCHC: 34.4 g/dL (ref 32.0–36.0)
MCV: 93.3 fL (ref 80.0–100.0)
Platelets: 258 10*3/uL (ref 150–440)
RBC: 4.66 MIL/uL (ref 3.80–5.20)
RDW: 13.7 % (ref 11.5–14.5)
WBC: 6.3 10*3/uL (ref 3.6–11.0)

## 2015-11-16 LAB — ECHOCARDIOGRAM COMPLETE
Height: 63 in
WEIGHTICAEL: 2934.4 [oz_av]

## 2015-11-16 LAB — APTT: aPTT: 28 seconds (ref 24–36)

## 2015-11-16 LAB — PROTIME-INR
INR: 0.95
PROTHROMBIN TIME: 12.9 s (ref 11.4–15.0)

## 2015-11-16 LAB — GLUCOSE, CAPILLARY: Glucose-Capillary: 123 mg/dL — ABNORMAL HIGH (ref 65–99)

## 2015-11-16 MED ORDER — ENOXAPARIN SODIUM 40 MG/0.4ML ~~LOC~~ SOLN
40.0000 mg | SUBCUTANEOUS | Status: DC
  Administered 2015-11-16 – 2015-11-17 (×2): 40 mg via SUBCUTANEOUS
  Filled 2015-11-16 (×2): qty 0.4

## 2015-11-16 MED ORDER — ASPIRIN 300 MG RE SUPP: 300.0000 mg | Freq: Every day | RECTAL | Status: DC

## 2015-11-16 MED ORDER — ASPIRIN 81 MG PO CHEW: 324.0000 mg | CHEWABLE_TABLET | Freq: Once | ORAL | Status: DC

## 2015-11-16 MED ORDER — TRAZODONE HCL 50 MG PO TABS
50.0000 mg | ORAL_TABLET | Freq: Every day | ORAL | Status: DC
  Administered 2015-11-16: 50 mg via ORAL
  Filled 2015-11-16: qty 1

## 2015-11-16 MED ORDER — ASPIRIN 325 MG PO TABS
325.0000 mg | ORAL_TABLET | Freq: Every day | ORAL | Status: DC
Start: 2015-11-16 — End: 2015-11-17
  Administered 2015-11-17: 11:00:00 325 mg via ORAL
  Filled 2015-11-16 (×2): qty 1

## 2015-11-16 MED ORDER — ATORVASTATIN CALCIUM 20 MG PO TABS
40.0000 mg | ORAL_TABLET | Freq: Every day | ORAL | Status: DC
  Administered 2015-11-16 – 2015-11-17 (×2): 40 mg via ORAL
  Filled 2015-11-16 (×2): qty 2

## 2015-11-16 MED ORDER — LOSARTAN POTASSIUM 50 MG PO TABS: 50.0000 mg | ORAL_TABLET | Freq: Once | ORAL | Status: DC

## 2015-11-16 MED ORDER — STROKE: EARLY STAGES OF RECOVERY BOOK
Freq: Once | Status: AC
  Administered 2015-11-16: 12:00:00

## 2015-11-16 MED ORDER — LISINOPRIL 10 MG PO TABS
10.0000 mg | ORAL_TABLET | Freq: Every day | ORAL | Status: DC
  Administered 2015-11-16 – 2015-11-17 (×2): 10 mg via ORAL
  Filled 2015-11-16 (×2): qty 1

## 2015-11-16 NOTE — ED Provider Notes (Signed)
[facility] Emergency Department Provider Note  ____________________________________________  Time seen: Approximately 9:07 AM  I have reviewed the triage vital signs and the nursing notes.   HISTORY  Chief Complaint Code Stroke    HPI Bonnie Jackson is a 75 y.o. female presents for evaluation of suddenly noticing her left leg felt weak. She reports she went to bed last night and felt fine. She got up and use the bathroom at 4 AM, and the last time she knows that she felt normal was at 4 AM. This was clarified, and confirmed.  The patient then woke up at about 6:30 and since then has been having trouble unable to use the left leg well, having difficulty walking. Denies any trouble with her hands face or speech, however reports feeling numbness in the left leg and that the left leg feels heavy" doesn't want to work". She called EMS after a couple hours because her symptoms were not improving.  She denies a personal history of stroke.  She denies taking any blood thinners, aside from daily aspirin.  No fevers chills or headache. No chest pain or trouble breathing.  Past Medical History  Diagnosis Date  . Hypertension   . Brain aneurysm     Patient Active Problem List   Diagnosis Date Noted  . Aneurysm, cerebral, nonruptured 01/04/2015  . Essential (primary) hypertension 01/04/2015  . HLD (hyperlipidemia) 01/04/2015  . Routine general medical examination at a health care facility 01/04/2015  . Need for vaccination 01/04/2015  . Idiopathic insomnia 01/04/2015  . Cutaneous eruption 01/04/2015  . Special screening for malignant neoplasms, colon 01/04/2015    Past Surgical History  Procedure Laterality Date  . Abdominal hernia repair    . Total abdominal hysterectomy    . Cholecystectomy    . Replacement total knee Bilateral     Current Outpatient Rx  Name  Route  Sig  Dispense  Refill  . aspirin 81 MG tablet   Oral   Take 1 tablet by mouth daily.         Marland Kitchen atorvastatin (LIPITOR) 40 MG tablet   Oral   Take 40 mg by mouth at bedtime. For heart and cholesterol.         . gabapentin (NEURONTIN) 300 MG capsule   Oral   Take 300 mg by mouth 2 (two) times daily.         . metoprolol (LOPRESSOR) 50 MG tablet   Oral   Take 50 mg by mouth 2 (two) times daily.         Marland Kitchen omeprazole (PRILOSEC) 20 MG capsule   Oral   Take 20 mg by mouth daily.         Marland Kitchen lisinopril-hydrochlorothiazide (PRINZIDE,ZESTORETIC) 10-12.5 MG per tablet   Oral   Take 1 tablet by mouth daily.         . metroNIDAZOLE (METROGEL) 1 % gel      APPLY TO ALL AFFECTED AREAS QD      3   . traZODone (DESYREL) 50 MG tablet                 Allergies Review of patient's allergies indicates no known allergies.  Family History  Problem Relation Age of Onset  . Cancer Daughter   . Breast cancer Daughter 15  . Breast cancer Mother 5    Social History Social History  Substance Use Topics  . Smoking status: Never Smoker   . Smokeless tobacco: None  . Alcohol  Use: 0.0 oz/week    0 Standard drinks or equivalent per week     Comment: occasional    Review of Systems Constitutional: No fever/chills Eyes: No visual changes. ENT: No sore throat. Cardiovascular: Denies chest pain. Respiratory: Denies shortness of breath. Gastrointestinal: No abdominal pain.  No nausea, no vomiting.  No diarrhea.  No constipation. Genitourinary: Negative for dysuria. Musculoskeletal: Negative for back pain. Skin: Negative for rash. Neurological: Negative for headaches or Neck pain.  10-point ROS otherwise negative.  ____________________________________________   PHYSICAL EXAM:  VITAL SIGNS: ED Triage Vitals  Enc Vitals Group     BP 11/16/15 0857 167/126 mmHg     Pulse Rate 11/16/15 0857 72     Resp 11/16/15 0857 18     Temp 11/16/15 0857 98.4 F (36.9 C)     Temp Source 11/16/15 0857 Oral     SpO2 11/16/15 0857 97 %     Weight 11/16/15 0857 183 lb  6.4 oz (83.19 kg)     Height 11/16/15 0857 5\' 3"  (1.6 m)     Head Cir --      Peak Flow --      Pain Score 11/16/15 0858 0     Pain Loc --      Pain Edu? --      Excl. in [place]? --    Constitutional: Alert and oriented. Well appearing and in no acute distress. Eyes: Conjunctivae are normal. PERRL. EOMI. Head: Atraumatic. Nose: No congestion/rhinnorhea. Mouth/Throat: Mucous membranes are moist.  Oropharynx non-erythematous. Neck: No stridor.   Cardiovascular: Normal rate, regular rhythm. Grossly normal heart sounds.  Good peripheral circulation. Respiratory: Normal respiratory effort.  No retractions. Lungs CTAB. Gastrointestinal: Soft and nontender. No distention.  Musculoskeletal: No lower extremity tenderness nor edema.  No joint effusions. Normal dorsalis pedis posterior tibial pulses bilateral. Neurologic:  Normal speech and language.   NIH score equals 2, performed by me at bedside. The patient has no pronator drift. The patient has normal cranial nerve exam. Extraocular movements are normal. Visual fields are normal. Patient has 5 out of 5 strength in all extremities except approximately 4 out of 5 in the left lower leg. There is no numbness or gross, acute sensory abnormality in the extremities bilaterally except for moderate loss of sensation across the left lower leg. No speech disturbance. No dysarthria. No aphasia. No ataxia. Normal finger nose finger bilat. Patient speaking in full and clear sentences.   Skin:  Skin is warm, dry and intact. No rash noted. Psychiatric: Mood and affect are normal. Speech and behavior are normal.  ____________________________________________   LABS (all labs ordered are listed, but only abnormal results are displayed)  Labs Reviewed  GLUCOSE, CAPILLARY - Abnormal; Notable for the following:    Glucose-Capillary 123 (*)    All other components within normal limits  CBC  DIFFERENTIAL  PROTIME-INR  APTT  COMPREHENSIVE METABOLIC  PANEL  CBG MONITORING, ED   ____________________________________________  EKG  Reviewed and interpreted by me at 9 AM Normal sinus rhythm Heart rate 75 QTC 430 PR 170 , No evidence of acute ischemic abnormality Reviewed and interpreted as normal sinus rhythm, no ischemic change ____________________________________________  RADIOLOGY    CT Head Wo Contrast (Final result) Result time: 11/16/15 09:17:37   Final result by Rad Results In Interface (11/16/15 09:17:37)   Narrative:   CLINICAL DATA: Left-sided weakness.  EXAM: CT HEAD WITHOUT CONTRAST  TECHNIQUE: Contiguous axial images were obtained from the base of the skull through  the vertex without intravenous contrast.  COMPARISON: None.  FINDINGS: Bony calvarium appears intact. Minimal diffuse cortical atrophy is noted. Mild chronic ischemic white matter disease is noted. No mass effect or midline shift is noted. Ventricular size is within normal limits. There is no evidence of mass lesion, hemorrhage or acute infarction.  IMPRESSION: Minimal diffuse cortical atrophy. Mild chronic ischemic white matter disease. No acute intracranial abnormality seen. These results were called by telephone at the time of interpretation on 11/16/2015 at 9:16 am to Dr. Delman Kitten , who verbally acknowledged these results.   Electronically Signed By: Marijo Conception, M.D. On: 11/16/2015 09:17    ____________________________________________   PROCEDURES  Procedure(s) performed: None  Critical Care performed: Yes, see critical care note(s)  CRITICAL CARE Performed by: Delman Kitten   Total critical care time: 40 minutes  Critical care time was exclusive of separately billable procedures and treating other patients.  Critical care was necessary to treat or prevent imminent or life-threatening deterioration.  Critical care was time spent personally by me on the following activities: development of treatment plan  with patient and/or surrogate as well as nursing, discussions with consultants, evaluation of patient's response to treatment, examination of patient, obtaining history from patient or surrogate, ordering and performing treatments and interventions, ordering and review of laboratory studies, ordering and review of radiographic studies, pulse oximetry and re-evaluation of patient's condition.  Patient presents with acute neurologic deficit involving the left lower extremity. Time of onset is not entirely clear but last seen normal is clear at 4 AM. She presents today greater than 4-1/2 hours after symptom onset, and in discussion with Dr. Irish Elders of neurology he advises that the patient's minor symptoms, history of previous cerebral aneurysm, and presentation today she is not a TPA candidate. I also placed neurology specialist on-call consult for acute stroke symptoms started within 8 hours. ____________________________________________   INITIAL IMPRESSION / ASSESSMENT AND PLAN / ED COURSE  Pertinent labs & imaging results that were available during my care of the patient were reviewed by me and considered in my medical decision making (see chart for details).  Breasts with focal deficit in the left lower extremity. She does have a history of cerebral aneurysm as well, however CT does not reveal any bleed, and she is not having a headache serially making ruptured aneurysm extremely unlikely. She was seen by neurology is outside the TPA window at presentation, the recommended admission for further workup and stroke evaluation. No evidence of acute cardiopulmonary abnormality likely on history or exam.  ----------------------------------------- 9:46 AM on 11/16/2015 -----------------------------------------  Patient's left leg weakness seems to be slowly improving, now exhibiting some better movement. Discussed with the patient and will admit her for further evaluation. Admitted to hospitalist  service, discussed with Dr. Posey Pronto.  Continuing to monitor patient's blood pressure which is hypertensive, bordering around [id number] systolic. ____________________________________________   FINAL CLINICAL IMPRESSION(S) / ED DIAGNOSES  Final diagnoses:  Left leg weakness  Acute ischemic stroke (HCC)      Delman Kitten, MD 11/16/15 [phone]

## 2015-11-16 NOTE — ED Notes (Signed)
Pt to ed with c/o left sided weakness, last known well at 4 am.  Pt alert and oriented on arrival.  Pt with continued weakness in left lower leg.  Pt right arm with equal grips to right.  Pt brought to er by ems from home.  md at bedside.

## 2015-11-16 NOTE — ED Notes (Signed)
Soc being done at this time.

## 2015-11-16 NOTE — Plan of Care (Addendum)
Problem: Physical Regulation: Goal: Ability to maintain clinical measurements within normal limits will improve Outcome: Progressing Pt admitted today from the ED. NIH 1. Weakness in LLE. No neuro changes since admission. Denies pain.

## 2015-11-16 NOTE — Progress Notes (Signed)
*  PRELIMINARY RESULTS* Echocardiogram 2D Echocardiogram has been performed.  Bonnie Jackson 11/16/2015, 2:38 PM

## 2015-11-16 NOTE — ED Notes (Signed)
Pt assisted to bedside commode. Pt tolerated well.

## 2015-11-16 NOTE — H&P (Signed)
[facility] [place] NAME: Bonnie Jackson    MR#:  [id number]  DATE OF BIRTH:  08-02-1941  DATE OF ADMISSION:  11/16/2015  PRIMARY CARE PHYSICIAN: Halina Maidens, MD   REQUESTING/REFERRING PHYSICIAN:   CHIEF COMPLAINT:   Chief Complaint  Patient presents with  . Code Stroke    HISTORY OF PRESENT ILLNESS: Bonnie Jackson  is a 75 y.o. female with a known history of hypertension, brain aneurysm who got up to use the bathroom this morning at 4 AM and was fine. When she woke up at 6:30 she was having weakness and difficulty walking on the left leg. She also had difficulty with holding things in both of her hands. The patient continues to have weakness in the left leg. The ER physician did contact telemetry and neurology. Regarding TPA. She was not felt to be a TPA candidate based on the last known time of being well which was 4 AM. She denies any swallowing difficulty.  PAST MEDICAL HISTORY:   Past Medical History  Diagnosis Date  . Hypertension   . Brain aneurysm     PAST SURGICAL HISTORY: Past Surgical History  Procedure Laterality Date  . Abdominal hernia repair    . Total abdominal hysterectomy    . Cholecystectomy    . Replacement total knee Bilateral     SOCIAL HISTORY:  Social History  Substance Use Topics  . Smoking status: Never Smoker   . Smokeless tobacco: Not on file  . Alcohol Use: No     Comment: occasional    FAMILY HISTORY:  Family History  Problem Relation Age of Onset  . Cancer Daughter   . Breast cancer Daughter 2  . Breast cancer Mother 62    DRUG ALLERGIES: No Known Allergies  REVIEW OF SYSTEMS:   CONSTITUTIONAL: No fever, fatigue or weakness.  EYES: No blurred or double vision.  EARS, NOSE, AND THROAT: No tinnitus or ear pain.  RESPIRATORY: No cough, shortness of breath, wheezing or hemoptysis.  CARDIOVASCULAR: No chest pain, orthopnea, edema.  GASTROINTESTINAL: No nausea, vomiting, diarrhea or  abdominal pain.  GENITOURINARY: No dysuria, hematuria.  ENDOCRINE: No polyuria, nocturia,  HEMATOLOGY: No anemia, easy bruising or bleeding SKIN: No rash or lesion. MUSCULOSKELETAL: No joint pain or arthritis.   NEUROLOGIC: Left leg weakness  PSYCHIATRY: No anxiety or depression.   MEDICATIONS AT HOME:  Prior to Admission medications   Medication Sig Start Date End Date Taking? Authorizing Provider  aspirin 81 MG tablet Take 1 tablet by mouth daily.   Yes Historical Provider, MD  atorvastatin (LIPITOR) 40 MG tablet Take 40 mg by mouth at bedtime. For heart and cholesterol.   Yes Historical Provider, MD  gabapentin (NEURONTIN) 300 MG capsule Take 300 mg by mouth 2 (two) times daily.   Yes Historical Provider, MD  metoprolol (LOPRESSOR) 50 MG tablet Take 50 mg by mouth 2 (two) times daily.   Yes Historical Provider, MD  omeprazole (PRILOSEC) 20 MG capsule Take 20 mg by mouth daily.   Yes Historical Provider, MD  lisinopril-hydrochlorothiazide (PRINZIDE,ZESTORETIC) 10-12.5 MG per tablet Take 1 tablet by mouth daily. 12/22/14   Historical Provider, MD  metroNIDAZOLE (METROGEL) 1 % gel APPLY TO ALL AFFECTED AREAS QD 04/14/15   Historical Provider, MD  traZODone (DESYREL) 50 MG tablet  06/04/15   Historical Provider, MD      PHYSICAL EXAMINATION:   VITAL SIGNS: Blood pressure 189/82, pulse 77, temperature 97.5 F (36.4 C), temperature  source Oral, resp. rate 26, height 5\' 3"  (1.6 m), weight 83.19 kg (183 lb 6.4 oz), SpO2 96 %.  GENERAL:  75 y.o.-year-old patient lying in the bed with no acute distress.  EYES: Pupils equal, round, reactive to light and accommodation. No scleral icterus. Extraocular muscles intact.  HEENT: Head atraumatic, normocephalic. Oropharynx and nasopharynx clear.  NECK:  Supple, no jugular venous distention. No thyroid enlargement, no tenderness.  LUNGS: Normal breath sounds bilaterally, no wheezing, rales,rhonchi or crepitation. No use of accessory muscles of  respiration.  CARDIOVASCULAR: S1, S2 normal. No murmurs, rubs, or gallops.  ABDOMEN: Soft, nontender, nondistended. Bowel sounds present. No organomegaly or mass.  EXTREMITIES: No pedal edema, cyanosis, or clubbing.  NEUROLOGIC: Cranial nerves II through XII are intact.  Left leg weak 4 out of 5 strength, reflexes 2+ Babinski's downgoing  PSYCHIATRIC: The patient is alert and oriented x 3.  SKIN: No obvious rash, lesion, or ulcer.   LABORATORY PANEL:   CBC  Recent Labs Lab 11/16/15 0908  WBC 6.3  HGB 14.9  HCT 43.5  PLT 258  MCV 93.3  MCH 32.1  MCHC 34.4  RDW 13.7  LYMPHSABS 1.2  MONOABS 0.7  EOSABS 0.1  BASOSABS 0.1   ------------------------------------------------------------------------------------------------------------------  Chemistries   Recent Labs Lab 11/16/15 0908  NA 134*  K 3.7  CL 103  CO2 24  GLUCOSE 126*  BUN 19  CREATININE 0.68  CALCIUM 10.2  AST 23  ALT 23  ALKPHOS 53  BILITOT 0.4   ------------------------------------------------------------------------------------------------------------------ estimated creatinine clearance is 63 mL/min (by C-G formula based on Cr of 0.68). ------------------------------------------------------------------------------------------------------------------ No results for input(s): TSH, T4TOTAL, T3FREE, THYROIDAB in the last 72 hours.  Invalid input(s): FREET3   Coagulation profile  Recent Labs Lab 11/16/15 0908  INR 0.95   ------------------------------------------------------------------------------------------------------------------- No results for input(s): DDIMER in the last 72 hours. -------------------------------------------------------------------------------------------------------------------  Cardiac Enzymes No results for input(s): CKMB, TROPONINI, MYOGLOBIN in the last 168 hours.  Invalid input(s):  CK ------------------------------------------------------------------------------------------------------------------ Invalid input(s): POCBNP  ---------------------------------------------------------------------------------------------------------------  Urinalysis No results found for: COLORURINE, APPEARANCEUR, LABSPEC, PHURINE, GLUCOSEU, HGBUR, BILIRUBINUR, KETONESUR, PROTEINUR, UROBILINOGEN, NITRITE, LEUKOCYTESUR   RADIOLOGY: Ct Head Wo Contrast  11/16/2015  CLINICAL DATA:  Left-sided weakness. EXAM: CT HEAD WITHOUT CONTRAST TECHNIQUE: Contiguous axial images were obtained from the base of the skull through the vertex without intravenous contrast. COMPARISON:  None. FINDINGS: Bony calvarium appears intact. Minimal diffuse cortical atrophy is noted. Mild chronic ischemic white matter disease is noted. No mass effect or midline shift is noted. Ventricular size is within normal limits. There is no evidence of mass lesion, hemorrhage or acute infarction. IMPRESSION: Minimal diffuse cortical atrophy. Mild chronic ischemic white matter disease. No acute intracranial abnormality seen. These results were called by telephone at the time of interpretation on 11/16/2015 at 9:16 am to Dr. Delman Kitten , who verbally acknowledged these results. Electronically Signed   By: Marijo Conception, M.D.   On: 11/16/2015 09:17    EKG: Orders placed or performed during the hospital encounter of 11/16/15  . EKG 12-Lead  . EKG 12-Lead  . ED EKG  . ED EKG    IMPRESSION AND PLAN: Patient is a 75 year old white female presents with left-sided weakness  1. Acute CVA: I will continue full dose aspirin for time being. Will obtain MRI of the brain MRA of the brain. We'll obtain a carotid Doppler as well as echocardiogram of the heart. Her swallowing screening was done in the emergency room and she has passed  that we'll advance her diet I will ask neurology to evaluate the patient I will start her on some  cholesterol-lowering medication check a fasting lipid panel M Obtain physical therapy evaluation  2. Hypertension allow permissive hypertension I will only continue her lisinopril  3. Miscellaneous we'll do Lovenox for DVT prophylaxis     All the records are reviewed and case discussed with ED provider. Management plans discussed with the patient, family and they are in agreement.  CODE STATUS:    Code Status Orders        Start     Ordered   11/16/15 1001  Full code   Continuous     11/16/15 1003    Code Status History    Date Active Date Inactive Code Status Order ID Comments User Context   This patient has a current code status but no historical code status.       TOTAL TIME TAKING CARE OF THIS PATIENT:55 minutes.    Dustin Flock M.D on 11/16/2015 at 10:12 AM  Between 7am to 6pm - Pager - [phone]  After 6pm go to [email or web address] - password [email or web address] [facility]  Office  [phone]  CC: Primary care physician; Halina Maidens, MD

## 2015-11-16 NOTE — Progress Notes (Signed)
Speech Therapy Note: received order, reviewed chart notes. Met w/ pt after consulting NSG. Pt was verbally conversive w/ no apparent language deficits noted; speech intelligible. Pt was able to use her cell phone and talk w/ Dtr appropriately. Pt also ate a salad while SLP was in the room w/ no overt swallowing deficits noted. Pt stated she felt she was at her baseline w/ regard to speech and swallowing. ST will sign off at this time; NSG to reconsult if any change in status while admitted; pt agreed.

## 2015-11-16 NOTE — ED Notes (Signed)
Dr. patel at bedside

## 2015-11-17 DIAGNOSIS — I63432 Cerebral infarction due to embolism of left posterior cerebral artery: Secondary | ICD-10-CM

## 2015-11-17 LAB — LIPID PANEL
CHOLESTEROL: 190 mg/dL (ref 0–200)
HDL: 53 mg/dL (ref >40)
LDL Cholesterol: 120 mg/dL — ABNORMAL HIGH (ref 0–99)
Total CHOL/HDL Ratio: 3.6 RATIO
Triglycerides: 84 mg/dL (ref <150)
VLDL: 17 mg/dL (ref 0–40)

## 2015-11-17 LAB — HEMOGLOBIN A1C: HEMOGLOBIN A1C: 6.1 % — AB (ref 4.0–6.0)

## 2015-11-17 MED ORDER — ATORVASTATIN CALCIUM 40 MG PO TABS: 20.0000 mg | ORAL_TABLET | Freq: Every day | ORAL | Status: DC

## 2015-11-17 MED ORDER — ASPIRIN 325 MG PO TABS: 325.0000 mg | ORAL_TABLET | Freq: Every day | ORAL | Status: DC

## 2015-11-17 NOTE — Progress Notes (Signed)
Pt being discharged today, discharge instructions given to pt, she verified understanding. Pt belongings returned, prescriptions given to pt. IV X2 removed. She will be rolled out in wheelchair by staff.

## 2015-11-17 NOTE — Evaluation (Signed)
Physical Therapy Evaluation Patient Details Name: Bonnie Jackson MRN: [id number] DOB: 03/17/1941 Today's Date: 11/17/2015   History of Present Illness  Bonnie Jackson is a 75yo white female who awoke to find LLE deficits and difficulty walking. She came to [facility] and imaging revealed a R thalamic infarct. Pt admitted for CVA. PMH: HTN, bilat TKA (~5ya).   Clinical Impression  Pt performing all functional mobility and gait at baseline level of function, reporting that she is using more caution than typical, but only in anticipation of acute deficits: acute deficits are limited to paresthesia of the L foot which has improved since yesterday. Pt reports majority of gait deficits are chronic and related to B TKA, as well as some chronic insidious gait instability. Recommended the patient proceed with SPC on uneven surfaces (grass, gravel, sand) as needed, but otherwise, safe to return to home once medically stable. No PT services recommended at this time. No DME needs.   Follow Up Recommendations No PT follow up    Equipment Recommendations  None recommended by PT    Recommendations for Other Services       Precautions / Restrictions Precautions Precautions: Fall      Mobility  Bed Mobility Overal bed mobility: Independent                Transfers Overall transfer level: Independent                  Ambulation/Gait Ambulation/Gait assistance: Modified independent (Device/Increase time) Ambulation Distance (Feet): 400 Feet Assistive device: None       General Gait Details: abducted LLE, decreased knee flexion during swing phase. No signfiicant gait deficits that are noted to be acute.   Stairs            Wheelchair Mobility    Modified Rankin (Stroke Patients Only)       Balance                                 Standardized Balance Assessment Standardized Balance Assessment : Berg Balance Test           Pertinent Vitals/Pain Pain Assessment:  No/denies pain    Home Living Family/patient expects to be discharged to:: Private residence Living Arrangements: Alone Available Help at Discharge: Family;Friend(s);Available PRN/intermittently Type of Home: House Home Access: Level entry       Home Equipment: None      Prior Function Level of Independence: Independent               Hand Dominance        Extremity/Trunk Assessment   Upper Extremity Assessment: Overall WFL for tasks assessed           Lower Extremity Assessment: Overall WFL for tasks assessed         Communication   Communication: No difficulties  Cognition                            General Comments      Exercises        Assessment/Plan    PT Assessment Patent does not need any further PT services  PT Diagnosis Abnormality of gait   PT Problem List    PT Treatment Interventions     PT Goals (Current goals can be found in the Care Plan section) Acute Rehab PT Goals PT Goal Formulation: All assessment and education  complete, DC therapy    Frequency     Barriers to discharge        Co-evaluation               End of Session Equipment Utilized During Treatment: Gait belt Activity Tolerance: Patient tolerated treatment well;No increased pain Patient left: in bed;Other (comment);with family/visitor present (c physician in room. ) Nurse Communication: Mobility status;Other (comment)         [id number] PT Time Calculation (min) (ACUTE ONLY): 30 min   Charges:   PT Evaluation $PT Eval Low Complexity: 1 Procedure PT Treatments $Therapeutic Activity: 8-22 mins   PT G Codes:        11:01 AM, 12/11/15 Etta Grandchild, PT, DPT PRN Physical Therapist - [place] License # [id number] [phone] (mobile)

## 2015-11-17 NOTE — Evaluation (Signed)
Occupational Therapy Evaluation Patient Details Name: Chanea Vanderzanden MRN: [id number] DOB: [birth year] Today's Date: 11/17/2015    History of Present Illness Grasiela Moceri is a 75yo white female who awoke to find LLE deficits and difficulty walking. She came to [facility] and imaging revealed a R thalamic infarct. Pt admitted for CVA. PMH: HTN, bilat TKA (~5ya).    Clinical Impression   Pt. Is a 75 y.o. Female who was admitted with a right thalamic CVA. Pt. Is back to her baseline with left UE functioning for use during ADL and self-care tasks. No further OT skilled sevices are indicated at this time at this level of care.     Follow Up Recommendations  No OT follow up    Equipment Recommendations       Recommendations for Other Services       Precautions / Restrictions Precautions Precautions: Fall      Mobility Bed Mobility Overal bed mobility: Independent                Transfers Overall transfer level: Modified independent Equipment used: Rolling walker (2 wheeled)                  Balance Overall balance assessment: Modified Independent   Sitting balance-Leahy Scale: Good       Standing balance-Leahy Scale: Good                   Standardized Balance Assessment Standardized Balance Assessment : Berg Balance Test          ADL Overall ADL's : Needs assistance/impaired Eating/Feeding: Independent   Grooming: Independent               Lower Body Dressing: Independent                 General ADL Comments: Pt. reports being back to baseline with basic self-care tasks.     Vision     Perception     Praxis      Pertinent Vitals/Pain Pain Assessment: No/denies pain     Hand Dominance Right   Extremity/Trunk Assessment Upper Extremity Assessment Upper Extremity Assessment: Overall WFL for tasks assessed (Intact light touch sensation, proprioception, coordination,  and hand function skills.)   Lower Extremity Assessment Lower  Extremity Assessment: Overall WFL for tasks assessed       Communication Communication Communication: No difficulties   Cognition Arousal/Alertness: Awake/alert   Overall Cognitive Status: Within Functional Limits for tasks assessed                     General Comments       Exercises       Shoulder Instructions      Home Living Family/patient expects to be discharged to:: Private residence Living Arrangements: Alone Available Help at Discharge: Family;Friend(s) Type of Home: House Home Access: Level entry           Bathroom Shower/Tub: Tub/shower unit;Curtain   Bathroom Toilet: Standard Bathroom Accessibility: Yes   Home Equipment: None   Additional Comments: Independent with IADLs, driving, and walking the dogs.      Prior Functioning/Environment Level of Independence: Independent             OT Diagnosis:     OT Problem List:     OT Treatment/Interventions:      OT Goals(Current goals can be found in the care plan section) Acute Rehab OT Goals Patient Stated Goal: To return home OT Goal Formulation: With patient  OT Frequency:     Barriers to D/C:            Co-evaluation              End of Session Equipment Utilized During Treatment: Gait belt;Rolling walker  Activity Tolerance: Patient tolerated treatment well Patient left: in bed;with call bell/phone within reach;with bed alarm set   Time: 1130-1155 OT Time Calculation (min): 25 min Charges:  OT General Charges $OT Visit: 1 Procedure OT Evaluation $OT Eval Moderate Complexity: 1 Procedure G-Codes:    Harrel Carina, MS, OTR/L Harrel Carina 11/17/2015, 12:25 PM

## 2015-11-17 NOTE — Consult Note (Signed)
CC: L sided weakness   HPI: Bonnie Jackson is an 75 y.o. female  with a known history of hypertension, brain aneurysm who got up to use the bathroom this morning at 4 AM and was fine. When she woke up at 6:30 she was having weakness and difficulty walking on the left leg. Pt was not taking her ASA. Found to have R thalamic stroke. Strength significantly improved overnight.    Past Medical History  Diagnosis Date  . Hypertension   . Brain aneurysm     Past Surgical History  Procedure Laterality Date  . Abdominal hernia repair    . Total abdominal hysterectomy    . Cholecystectomy    . Replacement total knee Bilateral     Family History  Problem Relation Age of Onset  . Cancer Daughter   . Breast cancer Daughter 29  . Breast cancer Mother 32    Social History:  reports that she has never smoked. She does not have any smokeless tobacco history on file. She reports that she does not drink alcohol or use illicit drugs.  No Known Allergies  Medications: I have reviewed the patient's current medications.  ROS: History obtained from the patient  General ROS: negative for - chills, fatigue, fever, night sweats, weight gain or weight loss Psychological ROS: negative for - behavioral disorder, hallucinations, memory difficulties, mood swings or suicidal ideation Ophthalmic ROS: negative for - blurry vision, double vision, eye pain or loss of vision ENT ROS: negative for - epistaxis, nasal discharge, oral lesions, sore throat, tinnitus or vertigo Allergy and Immunology ROS: negative for - hives or itchy/watery eyes Hematological and Lymphatic ROS: negative for - bleeding problems, bruising or swollen lymph nodes Endocrine ROS: negative for - galactorrhea, hair pattern changes, polydipsia/polyuria or temperature intolerance Respiratory ROS: negative for - cough, hemoptysis, shortness of breath or wheezing Cardiovascular ROS: negative for - chest pain, dyspnea on exertion, edema or  irregular heartbeat Gastrointestinal ROS: negative for - abdominal pain, diarrhea, hematemesis, nausea/vomiting or stool incontinence Genito-Urinary ROS: negative for - dysuria, hematuria, incontinence or urinary frequency/urgency Musculoskeletal ROS: negative for - joint swelling or muscular weakness Neurological ROS: as noted in HPI Dermatological ROS: negative for rash and skin lesion changes  Physical Examination: Blood pressure 120/66, pulse 70, temperature 98.3 F (36.8 C), temperature source Oral, resp. rate 16, height 5\' 3"  (1.6 m), weight 183 lb 6.4 oz (83.19 kg), SpO2 95 %.   Neurological Examination Mental Status: Alert, oriented, thought content appropriate.  Speech fluent without evidence of aphasia.  Able to follow 3 step commands without difficulty. Cranial Nerves: II: Discs flat bilaterally; Visual fields grossly normal, pupils equal, round, reactive to light and accommodation III,IV, VI: ptosis not present, extra-ocular motions intact bilaterally V,VII: smile symmetric, facial light touch sensation normal bilaterally VIII: hearing normal bilaterally IX,X: gag reflex present XI: bilateral shoulder shrug XII: midline tongue extension Motor: Right : Upper extremity   5/5    Left:     Upper extremity   5/5  Lower extremity   5/5     Lower extremity   4/5 Tone and bulk:normal tone throughout; no atrophy noted Sensory: Pinprick and light touch intact throughout, bilaterally Deep Tendon Reflexes: 2+ and symmetric throughout Plantars: Right: downgoing   Left: downgoing Cerebellar:no ataxia/dysmetria  Gait: not tested.       Laboratory Studies:   Basic Metabolic Panel:  Recent Labs Lab 11/16/15 0908  NA 134*  K 3.7  CL 103  CO2 24  GLUCOSE 126*  BUN 19  CREATININE 0.68  CALCIUM 10.2    Liver Function Tests:  Recent Labs Lab 11/16/15 0908  AST 23  ALT 23  ALKPHOS 53  BILITOT 0.4  PROT 7.6  ALBUMIN 4.7   No results for input(s): LIPASE, AMYLASE  in the last 168 hours. No results for input(s): AMMONIA in the last 168 hours.  CBC:  Recent Labs Lab 11/16/15 0908  WBC 6.3  NEUTROABS 4.2  HGB 14.9  HCT 43.5  MCV 93.3  PLT 258    Cardiac Enzymes: No results for input(s): CKTOTAL, CKMB, CKMBINDEX, TROPONINI in the last 168 hours.  BNP: Invalid input(s): POCBNP  CBG:  Recent Labs Lab 11/16/15 0925  GLUCAP 123*    Microbiology: No results found for this or any previous visit.  Coagulation Studies:  Recent Labs  11/16/15 0908  LABPROT 12.9  INR 0.95    Urinalysis: No results for input(s): COLORURINE, LABSPEC, PHURINE, GLUCOSEU, HGBUR, BILIRUBINUR, KETONESUR, PROTEINUR, UROBILINOGEN, NITRITE, LEUKOCYTESUR in the last 168 hours.  Invalid input(s): APPERANCEUR  Lipid Panel:     Component Value Date/Time   CHOL 190 11/17/2015 0409   TRIG 84 11/17/2015 0409   HDL 53 11/17/2015 0409   CHOLHDL 3.6 11/17/2015 0409   VLDL 17 11/17/2015 0409   LDLCALC 120* 11/17/2015 0409    HgbA1C: No results found for: HGBA1C  Urine Drug Screen:  No results found for: LABOPIA, COCAINSCRNUR, LABBENZ, AMPHETMU, THCU, LABBARB  Alcohol Level: No results for input(s): ETH in the last 168 hours.  Other results: EKG: normal EKG, normal sinus rhythm, unchanged from previous tracings.  Imaging: Ct Head Wo Contrast  11/16/2015  CLINICAL DATA:  Left-sided weakness. EXAM: CT HEAD WITHOUT CONTRAST TECHNIQUE: Contiguous axial images were obtained from the base of the skull through the vertex without intravenous contrast. COMPARISON:  None. FINDINGS: Bony calvarium appears intact. Minimal diffuse cortical atrophy is noted. Mild chronic ischemic white matter disease is noted. No mass effect or midline shift is noted. Ventricular size is within normal limits. There is no evidence of mass lesion, hemorrhage or acute infarction. IMPRESSION: Minimal diffuse cortical atrophy. Mild chronic ischemic white matter disease. No acute intracranial  abnormality seen. These results were called by telephone at the time of interpretation on 11/16/2015 at 9:16 am to Dr. Delman Kitten , who verbally acknowledged these results. Electronically Signed   By: Marijo Conception, M.D.   On: 11/16/2015 09:17   Mr Brain Wo Contrast  11/16/2015  CLINICAL DATA:  75 year old female who awoke at 0630 hours with left lower extremity weakness and bilateral upper extremity weakness. Hypertension. Initial encounter. EXAM: MRI HEAD WITHOUT CONTRAST MRA HEAD WITHOUT CONTRAST TECHNIQUE: Multiplanar, multiecho pulse sequences of the brain and surrounding structures were obtained without intravenous contrast. Angiographic images of the head were obtained using MRA technique without contrast. COMPARISON:  Head CT without contrast 0907 hours today. FINDINGS: MRI HEAD FINDINGS Major intracranial vascular flow voids are preserved. 18 mm oval focus of restricted diffusion along the superior aspect of the right thalamus. Minimal T2 and FLAIR hyperintensity. No associated hemorrhage or mass effect. No contralateral or posterior fossa restricted diffusion. Patchy and confluent bilateral cerebral white matter T2 and FLAIR hyperintensity, moderate for age. No chronic cerebral blood products or cortical encephalomalacia identified. Negative deep gray matter nuclei aside from the acute findings. Brainstem and cerebellum also appear within normal limits for age. No midline shift, mass effect, evidence of mass lesion, ventriculomegaly, extra-axial collection or acute intracranial hemorrhage.  Cervicomedullary junction and pituitary are within normal limits. Visible internal auditory structures appear normal. Negative for age cervical spine. Mastoids are clear. Mild paranasal sinus mucosal thickening. Negative orbit and scalp soft tissues. MRA HEAD FINDINGS Antegrade flow in the posterior circulation with codominant distal vertebral arteries. Normal left PICA origin. Both AICA origins are patent, the right  appears dominant. No basilar stenosis. Normal left SCA and PCA origins. Fetal type right PCA origin. 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 Diminutive or absent left posterior communicating artery. The right PCA P1 and P2 segments appear normal. Bilateral PCA branches are within normal limits. Antegrade flow in both ICA siphons. No siphon stenosis. Normal ophthalmic and right posterior communicating artery origins. Patent carotid termini. Mildly dominant left A1 segment. Anterior communicating artery within normal limits. Small median artery of the corpus callosum suspected. Motion artifact degrades detail of the bilateral ACA branches otherwise. MCA M1 segments and bifurcations are within normal limits. Motion artifact degrades bilateral MCA branch detail otherwise. There is a small infundibulum of the left MCA M1 segment at the anterior temporal artery origin incidentally noted (series 18, image 11). IMPRESSION: 1. Acute lacunar infarct in the right thalamus with no associated hemorrhage or mass effect. 2. No emergent large vessel occlusion or definite associated MRA finding (See # 3). 3. 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 4. Moderate nonspecific cerebral white matter T2 and FLAIR hyperintensity, favor chronic small vessel disease in this setting. 5. Anterior circulation ACA and MCA branch detail on the MRA is degraded by motion. Electronically Signed   By: Genevie Ann M.D.   On: 11/16/2015 14:34   US Carotid Bilateral  11/16/2015  CLINICAL DATA:  CVA. EXAM: BILATERAL CAROTID DUPLEX ULTRASOUND TECHNIQUE: Pearline Cables scale imaging, color Doppler and duplex ultrasound were performed of bilateral carotid and vertebral arteries in the neck. COMPARISON:  None. FINDINGS: Criteria: Quantification of carotid stenosis is based on velocity parameters that  correlate the residual internal carotid diameter with NASCET-based stenosis levels, using the diameter of the distal internal carotid lumen as the denominator for stenosis measurement. Exam limited by motion. The following velocity measurements were obtained: RIGHT ICA:  136/39 cm/sec CCA:  99991111 cm/sec SYSTOLIC ICA/CCA RATIO:  0.5 DIASTOLIC ICA/CCA RATIO:  1.4 ECA:  122 cm/sec LEFT ICA:  97/22 cm/sec CCA:  0000000 cm/sec SYSTOLIC ICA/CCA RATIO:  1.3 DIASTOLIC ICA/CCA RATIO:  1.8 ECA:  77 cm/sec RIGHT CAROTID ARTERY: Moderate calcified plaque right carotid bifurcation and proximal ICA. No flow limiting stenosis. RIGHT VERTEBRAL ARTERY:  Patent antegrade flow. LEFT CAROTID ARTERY: Moderate calcified plaque left carotid bifurcation and proximal ICA. No flow limiting stenosis. LEFT VERTEBRAL ARTERY:  Patent with antegrade flow. IMPRESSION: 1. Moderate calcified plaque both carotid bifurcations and proximal ICAs. No flow limiting stenosis. Degree of stenosis less than 50% bilaterally. 2. Vertebral arteries are patent antegrade flow. Electronically Signed   By: Marcello Moores  Register   On: 11/16/2015 15:35   Mr Jodene Nam Head/brain Wo Cm  11/16/2015  CLINICAL DATA:  75 year old female who awoke at 0630 hours with left lower extremity weakness and bilateral upper extremity weakness. Hypertension. Initial encounter. EXAM: MRI HEAD WITHOUT CONTRAST MRA HEAD WITHOUT CONTRAST TECHNIQUE: Multiplanar, multiecho pulse sequences of the brain and surrounding structures were obtained without intravenous contrast. Angiographic images of the head were obtained using MRA technique  without contrast. COMPARISON:  Head CT without contrast 0907 hours today. FINDINGS: MRI HEAD FINDINGS Major intracranial vascular flow voids are preserved. 18 mm oval focus of restricted diffusion along the superior aspect of the right thalamus. Minimal T2 and FLAIR hyperintensity. No associated hemorrhage or mass effect. No contralateral or posterior fossa restricted  diffusion. Patchy and confluent bilateral cerebral white matter T2 and FLAIR hyperintensity, moderate for age. No chronic cerebral blood products or cortical encephalomalacia identified. Negative deep gray matter nuclei aside from the acute findings. Brainstem and cerebellum also appear within normal limits for age. No midline shift, mass effect, evidence of mass lesion, ventriculomegaly, extra-axial collection or acute intracranial hemorrhage. Cervicomedullary junction and pituitary are within normal limits. Visible internal auditory structures appear normal. Negative for age cervical spine. Mastoids are clear. Mild paranasal sinus mucosal thickening. Negative orbit and scalp soft tissues. MRA HEAD FINDINGS Antegrade flow in the posterior circulation with codominant distal vertebral arteries. Normal left PICA origin. Both AICA origins are patent, the right appears dominant. No basilar stenosis. Normal left SCA and PCA origins. Fetal type right PCA origin. 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 Diminutive or absent left posterior communicating artery. The right PCA P1 and P2 segments appear normal. Bilateral PCA branches are within normal limits. Antegrade flow in both ICA siphons. No siphon stenosis. Normal ophthalmic and right posterior communicating artery origins. Patent carotid termini. Mildly dominant left A1 segment. Anterior communicating artery within normal limits. Small median artery of the corpus callosum suspected. Motion artifact degrades detail of the bilateral ACA branches otherwise. MCA M1 segments and bifurcations are within normal limits. Motion artifact degrades bilateral MCA branch detail otherwise. There is a small infundibulum of the left MCA M1 segment at the anterior temporal artery origin incidentally noted (series 18, image 11). IMPRESSION: 1. Acute lacunar infarct in the right thalamus with no  associated hemorrhage or mass effect. 2. No emergent large vessel occlusion or definite associated MRA finding (See # 3). 3. 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 4. Moderate nonspecific cerebral white matter T2 and FLAIR hyperintensity, favor chronic small vessel disease in this setting. 5. Anterior circulation ACA and MCA branch detail on the MRA is degraded by motion. Electronically Signed   By: Genevie Ann M.D.   On: 11/16/2015 14:34     Assessment/Plan: 75 y.o. female  with a known history of hypertension, brain aneurysm who got up to use the bathroom this morning at 4 AM and was fine. When she woke up at 6:30 she was having weakness and difficulty walking on the left leg. Pt was not taking her ASA. Found to have R thalamic stroke. Strength significantly improved overnight.    LLE is 4/5 today.   Pt/ot Was not taking her ASA at home.  ASA and statin daily Pending echo. Small basilar tip aneurism.  Followed up with imaging such as CTA/MRA in 6 months to 1 yr and follow up with interventional neurology/radiology likely at [facility].  Leotis Pain  11/17/2015, 9:44 AM

## 2015-11-17 NOTE — Progress Notes (Signed)
Pt was able to independently get out of bed and make a  few steps to the bedside cammode twice during shift. Pt walked with a walker to the bathroom with minimal assistance once during shift. Pt still report numbness in left leg. Will continue to monitor.

## 2015-11-17 NOTE — Discharge Instructions (Signed)

## 2015-11-17 NOTE — Discharge Summary (Signed)
Bonnie Jackson, 75 y.o., DOB 1940-11-26, MRN [id number]. Admission date: 11/16/2015 Discharge Date 11/17/2015 Primary MD Halina Maidens, MD Admitting Physician Dustin Flock, MD  Admission Diagnosis  CVA (cerebral infarction) [I63.9] Left leg weakness [R29.898] Acute ischemic stroke [facility]) [I63.9]  Discharge Diagnosis   Active Problems:   CVA (cerebral infarction)  hyperlipidemia Hypertension 4 mm basilar tip aneurysm       Hospital Course Bonnie Jackson is a 75 y.o. female with a known history of hypertension, brain aneurysm who got up to use the bathroom this morning at 4 AM and was fine. When she woke up at 6:30 she was having weakness and difficulty walking on the left leg. She was seen in the ED and due to symptoms improving in the time frame out of TPA she was not given TPA. She was admitted to the hospital underwent MRI of the brain which showed acute lacunar infarct in the right thalamus. 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 She was seen by neurology who recommended aspirin therapy and antiplatelet therapy and blood pressure control. Patient was seen by physical therapy and she had no further symptoms so no physical therapy as needed. At this time she is doing well and is stable for discharge. In terms of her aneurysm neurology recommends follow-up with the MRA of the brain this needs to be arranged for her primary care provider [place] to be referred to neurosurgeon to follow this. Continues to be followed every 6 months to yearly.            Consults  neurology  Significant Tests:  See full reports for all details    Ct Head Wo Contrast  11/16/2015  CLINICAL DATA:  Left-sided weakness. EXAM: CT HEAD WITHOUT CONTRAST TECHNIQUE: Contiguous axial images were obtained from the base of the skull through the vertex without intravenous contrast. COMPARISON:  None. FINDINGS: Bony calvarium appears intact. Minimal diffuse cortical atrophy is  noted. Mild chronic ischemic white matter disease is noted. No mass effect or midline shift is noted. Ventricular size is within normal limits. There is no evidence of mass lesion, hemorrhage or acute infarction. IMPRESSION: Minimal diffuse cortical atrophy. Mild chronic ischemic white matter disease. No acute intracranial abnormality seen. These results were called by telephone at the time of interpretation on 11/16/2015 at 9:16 am to Dr. Delman Kitten , who verbally acknowledged these results. Electronically Signed   By: Marijo Conception, M.D.   On: 11/16/2015 09:17   Mr Brain Wo Contrast  11/16/2015  CLINICAL DATA:  75 year old female who awoke at 0630 hours with left lower extremity weakness and bilateral upper extremity weakness. Hypertension. Initial encounter. EXAM: MRI HEAD WITHOUT CONTRAST MRA HEAD WITHOUT CONTRAST TECHNIQUE: Multiplanar, multiecho pulse sequences of the brain and surrounding structures were obtained without intravenous contrast. Angiographic images of the head were obtained using MRA technique without contrast. COMPARISON:  Head CT without contrast 0907 hours today. FINDINGS: MRI HEAD FINDINGS Major intracranial vascular flow voids are preserved. 18 mm oval focus of restricted diffusion along the superior aspect of the right thalamus. Minimal T2 and FLAIR hyperintensity. No associated hemorrhage or mass effect. No contralateral or posterior fossa restricted diffusion. Patchy and confluent bilateral cerebral white matter T2 and FLAIR hyperintensity, moderate for age. No chronic cerebral blood products or cortical encephalomalacia identified. Negative deep gray matter nuclei aside from the acute findings. Brainstem and cerebellum also appear within normal limits for age. No midline shift, mass  effect, evidence of mass lesion, ventriculomegaly, extra-axial collection or acute intracranial hemorrhage. Cervicomedullary junction and pituitary are within normal limits. Visible internal auditory  structures appear normal. Negative for age cervical spine. Mastoids are clear. Mild paranasal sinus mucosal thickening. Negative orbit and scalp soft tissues. MRA HEAD FINDINGS Antegrade flow in the posterior circulation with codominant distal vertebral arteries. Normal left PICA origin. Both AICA origins are patent, the right appears dominant. No basilar stenosis. Normal left SCA and PCA origins. Fetal type right PCA origin. 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 Diminutive or absent left posterior communicating artery. The right PCA P1 and P2 segments appear normal. Bilateral PCA branches are within normal limits. Antegrade flow in both ICA siphons. No siphon stenosis. Normal ophthalmic and right posterior communicating artery origins. Patent carotid termini. Mildly dominant left A1 segment. Anterior communicating artery within normal limits. Small median artery of the corpus callosum suspected. Motion artifact degrades detail of the bilateral ACA branches otherwise. MCA M1 segments and bifurcations are within normal limits. Motion artifact degrades bilateral MCA branch detail otherwise. There is a small infundibulum of the left MCA M1 segment at the anterior temporal artery origin incidentally noted (series 18, image 11). IMPRESSION: 1. Acute lacunar infarct in the right thalamus with no associated hemorrhage or mass effect. 2. No emergent large vessel occlusion or definite associated MRA finding (See # 3). 3. 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 4. Moderate nonspecific cerebral white matter T2 and FLAIR hyperintensity, favor chronic small vessel disease in this setting. 5. Anterior circulation ACA and MCA branch detail on the MRA is degraded by motion. Electronically Signed   By: Genevie Ann M.D.   On: 11/16/2015 14:34   US Carotid  Bilateral  11/16/2015  CLINICAL DATA:  CVA. EXAM: BILATERAL CAROTID DUPLEX ULTRASOUND TECHNIQUE: Pearline Cables scale imaging, color Doppler and duplex ultrasound were performed of bilateral carotid and vertebral arteries in the neck. COMPARISON:  None. FINDINGS: Criteria: Quantification of carotid stenosis is based on velocity parameters that correlate the residual internal carotid diameter with NASCET-based stenosis levels, using the diameter of the distal internal carotid lumen as the denominator for stenosis measurement. Exam limited by motion. The following velocity measurements were obtained: RIGHT ICA:  136/39 cm/sec CCA:  99991111 cm/sec SYSTOLIC ICA/CCA RATIO:  0.5 DIASTOLIC ICA/CCA RATIO:  1.4 ECA:  122 cm/sec LEFT ICA:  97/22 cm/sec CCA:  0000000 cm/sec SYSTOLIC ICA/CCA RATIO:  1.3 DIASTOLIC ICA/CCA RATIO:  1.8 ECA:  77 cm/sec RIGHT CAROTID ARTERY: Moderate calcified plaque right carotid bifurcation and proximal ICA. No flow limiting stenosis. RIGHT VERTEBRAL ARTERY:  Patent antegrade flow. LEFT CAROTID ARTERY: Moderate calcified plaque left carotid bifurcation and proximal ICA. No flow limiting stenosis. LEFT VERTEBRAL ARTERY:  Patent with antegrade flow. IMPRESSION: 1. Moderate calcified plaque both carotid bifurcations and proximal ICAs. No flow limiting stenosis. Degree of stenosis less than 50% bilaterally. 2. Vertebral arteries are patent antegrade flow. Electronically Signed   By: Marcello Moores  Register   On: 11/16/2015 15:35   Mr Jodene Nam Head/brain Wo Cm  11/16/2015  CLINICAL DATA:  75 year old female who awoke at 0630 hours with left lower extremity weakness and bilateral upper extremity weakness. Hypertension. Initial encounter. EXAM: MRI HEAD WITHOUT CONTRAST MRA HEAD WITHOUT CONTRAST TECHNIQUE: Multiplanar, multiecho pulse sequences of the brain and surrounding structures were obtained without  intravenous contrast. Angiographic images of the head were obtained using MRA technique without contrast. COMPARISON:   Head CT without contrast 0907 hours today. FINDINGS: MRI HEAD FINDINGS Major intracranial vascular flow voids are preserved. 18 mm oval focus of restricted diffusion along the superior aspect of the right thalamus. Minimal T2 and FLAIR hyperintensity. No associated hemorrhage or mass effect. No contralateral or posterior fossa restricted diffusion. Patchy and confluent bilateral cerebral white matter T2 and FLAIR hyperintensity, moderate for age. No chronic cerebral blood products or cortical encephalomalacia identified. Negative deep gray matter nuclei aside from the acute findings. Brainstem and cerebellum also appear within normal limits for age. No midline shift, mass effect, evidence of mass lesion, ventriculomegaly, extra-axial collection or acute intracranial hemorrhage. Cervicomedullary junction and pituitary are within normal limits. Visible internal auditory structures appear normal. Negative for age cervical spine. Mastoids are clear. Mild paranasal sinus mucosal thickening. Negative orbit and scalp soft tissues. MRA HEAD FINDINGS Antegrade flow in the posterior circulation with codominant distal vertebral arteries. Normal left PICA origin. Both AICA origins are patent, the right appears dominant. No basilar stenosis. Normal left SCA and PCA origins. Fetal type right PCA origin. 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 Diminutive or absent left posterior communicating artery. The right PCA P1 and P2 segments appear normal. Bilateral PCA branches are within normal limits. Antegrade flow in both ICA siphons. No siphon stenosis. Normal ophthalmic and right posterior communicating artery origins. Patent carotid termini. Mildly dominant left A1 segment. Anterior communicating artery within normal limits. Small median artery of the corpus callosum suspected. Motion artifact degrades detail of the bilateral ACA branches  otherwise. MCA M1 segments and bifurcations are within normal limits. Motion artifact degrades bilateral MCA branch detail otherwise. There is a small infundibulum of the left MCA M1 segment at the anterior temporal artery origin incidentally noted (series 18, image 11). IMPRESSION: 1. Acute lacunar infarct in the right thalamus with no associated hemorrhage or mass effect. 2. No emergent large vessel occlusion or definite associated MRA finding (See # 3). 3. 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 4. Moderate nonspecific cerebral white matter T2 and FLAIR hyperintensity, favor chronic small vessel disease in this setting. 5. Anterior circulation ACA and MCA branch detail on the MRA is degraded by motion. Electronically Signed   By: Genevie Ann M.D.   On: 11/16/2015 14:34       Today   Subjective:   Bonnie Jackson patient doing well no complatins  Objective:   Blood pressure 160/74, pulse 100, temperature 98.3 F (36.8 C), temperature source Oral, resp. rate 16, height 5\' 3"  (1.6 m), weight 83.19 kg (183 lb 6.4 oz), SpO2 95 %.  .  Intake/Output Summary (Last 24 hours) at 11/17/15 1210 Last data filed at 11/17/15 1128  Gross per 24 hour  Intake    720 ml  Output    550 ml  Net    170 ml    Exam VITAL SIGNS: Blood pressure 160/74, pulse 100, temperature 98.3 F (36.8 C), temperature source Oral, resp. rate 16, height 5\' 3"  (1.6 m), weight 83.19 kg (183 lb 6.4 oz), SpO2 95 %.  GENERAL:  75 y.o.-year-old patient lying in the bed with no acute distress.  EYES: Pupils equal, round, reactive to light and accommodation. No scleral icterus. Extraocular muscles intact.  HEENT: Head atraumatic, normocephalic. Oropharynx and nasopharynx clear.  NECK:  Supple, no jugular venous distention. No thyroid enlargement, no tenderness.  LUNGS: Normal breath sounds bilaterally, no wheezing, rales,rhonchi or crepitation. No use of accessory  muscles of respiration.  CARDIOVASCULAR: S1, S2 normal. No murmurs, rubs, or gallops.  ABDOMEN: Soft, nontender, nondistended. Bowel sounds present. No organomegaly or mass.  EXTREMITIES: No pedal edema, cyanosis, or clubbing.  NEUROLOGIC: Cranial nerves II through XII are intact. Muscle strength 5/5 in all extremities. Sensation intact. Gait not checked.  PSYCHIATRIC: The patient is alert and oriented x 3.  SKIN: No obvious rash, lesion, or ulcer.   Data Review     CBC w Diff: Lab Results  Component Value Date   WBC 6.3 11/16/2015   HGB 14.9 11/16/2015   HCT 43.5 11/16/2015   PLT 258 11/16/2015   LYMPHOPCT 20 11/16/2015   MONOPCT 12 11/16/2015   EOSPCT 2 11/16/2015   BASOPCT 1 11/16/2015   CMP: Lab Results  Component Value Date   NA 134* 11/16/2015   K 3.7 11/16/2015   CL 103 11/16/2015   CO2 24 11/16/2015   BUN 19 11/16/2015   BUN 15 12/22/2014   CREATININE 0.68 11/16/2015   CREATININE 0.6 12/22/2014   PROT 7.6 11/16/2015   ALBUMIN 4.7 11/16/2015   BILITOT 0.4 11/16/2015   ALKPHOS 53 11/16/2015   AST 23 11/16/2015   ALT 23 11/16/2015  .  Micro Results No results found for this or any previous visit (from the past 240 hour(s)).      Code Status Orders        Start     Ordered   11/16/15 1001  Full code   Continuous     11/16/15 1003    Code Status History    Date Active Date Inactive Code Status Order ID Comments User Context   This patient has a current code status but no historical code status.          Follow-up Information    Follow up with Halina Maidens, MD In 7 days.   Specialty:  Family Medicine   Contact information:   [address] [place] [address] [phone]       Discharge Medications     Medication List    TAKE these medications        aspirin 325 MG tablet  Take 1 tablet (325 mg total) by mouth daily.     atorvastatin 40 MG tablet  Commonly known as:  LIPITOR  Take 0.5 tablets (20 mg total) by  mouth daily at 6 PM.     lisinopril-hydrochlorothiazide 10-12.5 MG tablet  Commonly known as:  PRINZIDE,ZESTORETIC  Take 1 tablet by mouth daily.     traZODone 50 MG tablet  Commonly known as:  DESYREL  at bedtime.           Total Time in preparing paper work, data evaluation and todays exam - 35 minutes  Dustin Flock M.D on 11/17/2015 at 12:10 PM  [facility]   Office  [phone]

## 2015-11-24 ENCOUNTER — Encounter: Payer: Self-pay | Admitting: Internal Medicine

## 2015-11-24 ENCOUNTER — Ambulatory Visit (INDEPENDENT_AMBULATORY_CARE_PROVIDER_SITE_OTHER): Payer: Medicare Other | Admitting: Internal Medicine

## 2015-11-24 VITALS — BP 162/78 | HR 73 | Ht 63.0 in | Wt 179.4 lb

## 2015-11-24 DIAGNOSIS — I63432 Cerebral infarction due to embolism of left posterior cerebral artery: Secondary | ICD-10-CM | POA: Diagnosis not present

## 2015-11-24 DIAGNOSIS — I671 Cerebral aneurysm, nonruptured: Secondary | ICD-10-CM | POA: Diagnosis not present

## 2015-11-24 DIAGNOSIS — I10 Essential (primary) hypertension: Secondary | ICD-10-CM

## 2015-11-24 MED ORDER — LISINOPRIL-HYDROCHLOROTHIAZIDE 20-25 MG PO TABS: 1.0000 | ORAL_TABLET | Freq: Every day | ORAL | Status: DC

## 2015-11-24 NOTE — Patient Instructions (Signed)
DASH Eating Plan  DASH stands for "Dietary Approaches to Stop Hypertension." The DASH eating plan is a healthy eating plan that has been shown to reduce high blood pressure (hypertension). Additional health benefits may include reducing the risk of type 2 diabetes mellitus, heart disease, and stroke. The DASH eating plan may also help with weight loss.  WHAT DO I NEED TO KNOW ABOUT THE DASH EATING PLAN?  For the DASH eating plan, you will follow these general guidelines:  · Choose foods with a percent daily value for sodium of less than 5% (as listed on the food label).  · Use salt-free seasonings or herbs instead of table salt or sea salt.  · Check with your health care provider or pharmacist before using salt substitutes.  · Eat lower-sodium products, often labeled as "lower sodium" or "no salt added."  · Eat fresh foods.  · Eat more vegetables, fruits, and low-fat dairy products.  · Choose whole grains. Look for the word "whole" as the first word in the ingredient list.  · Choose fish and skinless chicken or turkey more often than red meat. Limit fish, poultry, and meat to 6 oz (170 g) each day.  · Limit sweets, desserts, sugars, and sugary drinks.  · Choose heart-healthy fats.  · Limit cheese to 1 oz (28 g) per day.  · Eat more home-cooked food and less restaurant, buffet, and fast food.  · Limit fried foods.  · Cook foods using methods other than frying.  · Limit canned vegetables. If you do use them, rinse them well to decrease the sodium.  · When eating at a restaurant, ask that your food be prepared with less salt, or no salt if possible.  WHAT FOODS CAN I EAT?  Seek help from a dietitian for individual calorie needs.  Grains  Whole grain or whole wheat bread. Brown rice. Whole grain or whole wheat pasta. Quinoa, bulgur, and whole grain cereals. Low-sodium cereals. Corn or whole wheat flour tortillas. Whole grain cornbread. Whole grain crackers. Low-sodium crackers.  Vegetables  Fresh or frozen vegetables  (raw, steamed, roasted, or grilled). Low-sodium or reduced-sodium tomato and vegetable juices. Low-sodium or reduced-sodium tomato sauce and paste. Low-sodium or reduced-sodium canned vegetables.   Fruits  All fresh, canned (in natural juice), or frozen fruits.  Meat and Other Protein Products  Ground beef (85% or leaner), grass-fed beef, or beef trimmed of fat. Skinless chicken or turkey. Ground chicken or turkey. Pork trimmed of fat. All fish and seafood. Eggs. Dried beans, peas, or lentils. Unsalted nuts and seeds. Unsalted canned beans.  Dairy  Low-fat dairy products, such as skim or 1% milk, 2% or reduced-fat cheeses, low-fat ricotta or cottage cheese, or plain low-fat yogurt. Low-sodium or reduced-sodium cheeses.  Fats and Oils  Tub margarines without trans fats. Light or reduced-fat mayonnaise and salad dressings (reduced sodium). Avocado. Safflower, olive, or canola oils. Natural peanut or almond butter.  Other  Unsalted popcorn and pretzels.  The items listed above may not be a complete list of recommended foods or beverages. Contact your dietitian for more options.  WHAT FOODS ARE NOT RECOMMENDED?  Grains  White bread. White pasta. White rice. Refined cornbread. Bagels and croissants. Crackers that contain trans fat.  Vegetables  Creamed or fried vegetables. Vegetables in a cheese sauce. Regular canned vegetables. Regular canned tomato sauce and paste. Regular tomato and vegetable juices.  Fruits  Dried fruits. Canned fruit in light or heavy syrup. Fruit juice.  Meat and Other Protein   Products  Fatty cuts of meat. Ribs, chicken wings, bacon, sausage, bologna, salami, chitterlings, fatback, hot dogs, bratwurst, and packaged luncheon meats. Salted nuts and seeds. Canned beans with salt.  Dairy  Whole or 2% milk, cream, half-and-half, and cream cheese. Whole-fat or sweetened yogurt. Full-fat cheeses or blue cheese. Nondairy creamers and whipped toppings. Processed cheese, cheese spreads, or cheese  curds.  Condiments  Onion and garlic salt, seasoned salt, table salt, and sea salt. Canned and packaged gravies. Worcestershire sauce. Tartar sauce. Barbecue sauce. Teriyaki sauce. Soy sauce, including reduced sodium. Steak sauce. Fish sauce. Oyster sauce. Cocktail sauce. Horseradish. Ketchup and mustard. Meat flavorings and tenderizers. Bouillon cubes. Hot sauce. Tabasco sauce. Marinades. Taco seasonings. Relishes.  Fats and Oils  Butter, stick margarine, lard, shortening, ghee, and bacon fat. Coconut, palm kernel, or palm oils. Regular salad dressings.  Other  Pickles and olives. Salted popcorn and pretzels.  The items listed above may not be a complete list of foods and beverages to avoid. Contact your dietitian for more information.  WHERE CAN I FIND MORE INFORMATION?  National Heart, [employer], [employer] Institute: www.nhlbi.nih.gov/health/health-topics/topics/dash/     This information is not intended to replace advice given to you by your health care provider. Make sure you discuss any questions you have with your health care provider.     Document Released: 07/11/2011 Document Revised: 08/12/2014 Document Reviewed: 05/26/2013  [employer]vier Interactive Patient Education ©2016 [employer]

## 2015-11-24 NOTE — Progress Notes (Signed)
Date:  11/24/2015   Name:  Bonnie Jackson   DOB:  [birth year]   MRN:  [id number]   Chief Complaint: Hospitalization Follow-up and CVA Patient was admitted to [facility] on 11/16/2015 with an acute CVA. She exhibited left arm and leg weakness which improved and she was able to be discharged on 11/17/2015 without PTx . MRI of the brain showed a right thalamic infarct and a 4 mm basilar tip aneurysm. Carotid ultrasound showed moderate plaque in both carotids with less than 50% stenosis. The aneurysm was known from a study in 2013 when it was 3.3 mm size.  At discharge, it was recommended that she have a neurosurgical consultation.  In the past she was told that there was surgery or treatment available to her. She was discharged on aspirin 325 mg, atorvastatin 40 mg and lisinopril HCT.  Her BP at home has remained 0000000 systolic. Overall she feels tired but is able to do all her usual activities.  She is starting to walk daily with her friend. She still feels minor left leg weakness and is a bit unsure of herself.  She inquires about a cane and a temporary handicapped parking permit.  Review of Systems  Constitutional: Positive for fatigue. Negative for fever, appetite change and unexpected weight change.  HENT: Negative for tinnitus and trouble swallowing.   Eyes: Negative for visual disturbance.  Respiratory: Negative for cough, chest tightness and shortness of breath.   Cardiovascular: Negative for chest pain, palpitations and leg swelling.  Gastrointestinal: Negative for abdominal pain.  Endocrine: Negative for polydipsia and polyuria.  Genitourinary: Negative for dysuria and hematuria.  Musculoskeletal: Negative for arthralgias.  Neurological: Positive for weakness (mild left leg still). Negative for tremors, numbness and headaches.  Psychiatric/Behavioral: Negative for dysphoric mood.    Patient Active Problem List   Diagnosis Date Noted  . CVA (cerebral infarction) 11/16/2015  . Aneurysm,  cerebral, nonruptured 01/04/2015  . Essential (primary) hypertension 01/04/2015  . HLD (hyperlipidemia) 01/04/2015  . Routine general medical examination at a health care facility 01/04/2015  . Need for vaccination 01/04/2015  . Idiopathic insomnia 01/04/2015  . Cutaneous eruption 01/04/2015  . Special screening for malignant neoplasms, colon 01/04/2015    Prior to Admission medications   Medication Sig Start Date End Date Taking? Authorizing Provider  aspirin 325 MG tablet Take 1 tablet (325 mg total) by mouth daily. 11/17/15  Yes Dustin Flock, MD  atorvastatin (LIPITOR) 40 MG tablet Take 0.5 tablets (20 mg total) by mouth daily at 6 PM. 11/17/15  Yes Dustin Flock, MD  lisinopril-hydrochlorothiazide (PRINZIDE,ZESTORETIC) 10-12.5 MG per tablet Take 1 tablet by mouth daily. 12/22/14  Yes Historical Provider, MD  traZODone (DESYREL) 50 MG tablet at bedtime.  06/04/15  Yes Historical Provider, MD    No Known Allergies  Past Surgical History  Procedure Laterality Date  . Abdominal hernia repair    . Total abdominal hysterectomy    . Cholecystectomy    . Replacement total knee Bilateral     Social History  Substance Use Topics  . Smoking status: Never Smoker   . Smokeless tobacco: None  . Alcohol Use: No     Comment: occasional     Medication list has been reviewed and updated.   Physical Exam  Constitutional: She is oriented to person, place, and time. She appears well-developed. No distress.  HENT:  Head: Normocephalic and atraumatic.  Neck: Normal range of motion. Neck supple.  Cardiovascular: Normal rate, regular rhythm and normal heart  sounds.   Pulmonary/Chest: Effort normal and breath sounds normal. No respiratory distress. She has no wheezes.  Musculoskeletal: Normal range of motion. She exhibits no edema or tenderness.  Lymphadenopathy:    She has no cervical adenopathy.  Neurological: She is alert and oriented to person, place, and time. She has normal  strength. Gait (decreased stride length on left) abnormal.  Reflex Scores:      Bicep reflexes are 1+ on the right side and 1+ on the left side.      Patellar reflexes are 1+ on the right side and 1+ on the left side. Skin: Skin is warm and dry. No rash noted.  Psychiatric: She has a normal mood and affect. Her behavior is normal. Thought content normal.  Nursing note and vitals reviewed.   BP 162/78 mmHg  Pulse 73  Ht 5\' 3"  (1.6 m)  Wt 179 lb 6.4 oz (81.375 kg)  BMI 31.79 kg/m2  SpO2 98%  Assessment and Plan: 1. Cerebral infarction due to embolism of left posterior cerebral artery (HCC) Continue full dose aspirin, lipitor Advance activities as tolerated; may use a cane if needed Temp parking placard application provided - Ambulatory referral to Neurosurgery  2. Aneurysm, cerebral, nonruptured - Ambulatory referral to Neurosurgery  3. Essential (primary) hypertension Not controlled - increase dose and follow up in 6 weeks - lisinopril-hydrochlorothiazide (PRINZIDE,ZESTORETIC) 20-25 MG tablet; Take 1 tablet by mouth daily.  Dispense: 30 tablet; Refill: [place], MD [facility]  11/24/2015

## 2015-12-04 DIAGNOSIS — I671 Cerebral aneurysm, nonruptured: Secondary | ICD-10-CM | POA: Diagnosis not present

## 2015-12-04 DIAGNOSIS — Z6831 Body mass index (BMI) 31.0-31.9, adult: Secondary | ICD-10-CM | POA: Diagnosis not present

## 2015-12-11 ENCOUNTER — Telehealth (HOSPITAL_COMMUNITY): Payer: Self-pay | Admitting: Neurosurgery

## 2015-12-11 ENCOUNTER — Other Ambulatory Visit (HOSPITAL_COMMUNITY): Payer: Self-pay | Admitting: Neurosurgery

## 2015-12-11 DIAGNOSIS — I671 Cerebral aneurysm, nonruptured: Secondary | ICD-10-CM

## 2015-12-11 NOTE — Telephone Encounter (Signed)
Called pt to schedule her angiogram with Nundkumar. Pt states that she has an appointment with Dr. Leonie Man on 12/12/15 and that she would like to see him before making a decision about having this angiogram or not. She said she would call me back after that visit. JM

## 2015-12-12 ENCOUNTER — Ambulatory Visit: Payer: Medicare Other | Admitting: Neurology

## 2015-12-12 ENCOUNTER — Ambulatory Visit (INDEPENDENT_AMBULATORY_CARE_PROVIDER_SITE_OTHER): Payer: Medicare Other | Admitting: Neurology

## 2015-12-12 ENCOUNTER — Encounter: Payer: Self-pay | Admitting: Neurology

## 2015-12-12 ENCOUNTER — Telehealth: Payer: Self-pay | Admitting: Neurology

## 2015-12-12 VITALS — BP 148/74 | HR 72 | Ht 63.5 in | Wt 178.0 lb

## 2015-12-12 DIAGNOSIS — I639 Cerebral infarction, unspecified: Secondary | ICD-10-CM | POA: Diagnosis not present

## 2015-12-12 DIAGNOSIS — I63432 Cerebral infarction due to embolism of left posterior cerebral artery: Secondary | ICD-10-CM | POA: Diagnosis not present

## 2015-12-12 DIAGNOSIS — I671 Cerebral aneurysm, nonruptured: Secondary | ICD-10-CM

## 2015-12-12 DIAGNOSIS — R269 Unspecified abnormalities of gait and mobility: Secondary | ICD-10-CM

## 2015-12-12 NOTE — Telephone Encounter (Signed)
Returned pt's call. Let her know that order did specify [place] as location preference for PT. She can expect a call from PT office in the near future to schedule initial visit. Verbalized understanding and appreciation for call. Will call back if she is not contacted in a week or so and if any additional questions/concerns arise.

## 2015-12-12 NOTE — Patient Instructions (Signed)
Stroke Prevention Some medical conditions and behaviors are associated with an increased chance of having a stroke. You may prevent a stroke by making healthy choices and managing medical conditions. HOW CAN I REDUCE MY RISK OF HAVING A STROKE?   Stay physically active. Get at least 30 minutes of activity on most or all days.  Do not smoke. It may also be helpful to avoid exposure to secondhand smoke.  Limit alcohol use. Moderate alcohol use is considered to be:  No more than 2 drinks per day for men.  No more than 1 drink per day for nonpregnant women.  Eat healthy foods. This involves:  Eating 5 or more servings of fruits and vegetables a day.  Making dietary changes that address high blood pressure (hypertension), high cholesterol, diabetes, or obesity.  Manage your cholesterol levels.  Making food choices that are high in fiber and low in saturated fat, trans fat, and cholesterol may control cholesterol levels.  Take any prescribed medicines to control cholesterol as directed by your health care provider.  Manage your diabetes.  Controlling your carbohydrate and sugar intake is recommended to manage diabetes.  Take any prescribed medicines to control diabetes as directed by your health care provider.  Control your hypertension.  Making food choices that are low in salt (sodium), saturated fat, trans fat, and cholesterol is recommended to manage hypertension.  Ask your health care provider if you need treatment to lower your blood pressure. Take any prescribed medicines to control hypertension as directed by your health care provider.  If you are 18-39 years of age, have your blood pressure checked every 3-5 years. If you are 40 years of age or older, have your blood pressure checked every year.  Maintain a healthy weight.  Reducing calorie intake and making food choices that are low in sodium, saturated fat, trans fat, and cholesterol are recommended to manage  weight.  Stop drug abuse.  Avoid taking birth control pills.  Talk to your health care provider about the risks of taking birth control pills if you are over 35 years old, smoke, get migraines, or have ever had a blood clot.  Get evaluated for sleep disorders (sleep apnea).  Talk to your health care provider about getting a sleep evaluation if you snore a lot or have excessive sleepiness.  Take medicines only as directed by your health care provider.  For some people, aspirin or blood thinners (anticoagulants) are helpful in reducing the risk of forming abnormal blood clots that can lead to stroke. If you have the irregular heart rhythm of atrial fibrillation, you should be on a blood thinner unless there is a good reason you cannot take them.  Understand all your medicine instructions.  Make sure that other conditions (such as anemia or atherosclerosis) are addressed. SEEK IMMEDIATE MEDICAL CARE IF:   You have sudden weakness or numbness of the face, arm, or leg, especially on one side of the body.  Your face or eyelid droops to one side.  You have sudden confusion.  You have trouble speaking (aphasia) or understanding.  You have sudden trouble seeing in one or both eyes.  You have sudden trouble walking.  You have dizziness.  You have a loss of balance or coordination.  You have a sudden, severe headache with no known cause.  You have new chest pain or an irregular heartbeat. Any of these symptoms may represent a serious problem that is an emergency. Do not wait to see if the symptoms will   go away. Get medical help at once. Call your local emergency services (911 in [place] Do not drive yourself to the hospital.   This information is not intended to replace advice given to you by your health care provider. Make sure you discuss any questions you have with your health care provider.   Document Released: 08/29/2004 Document Revised: 08/12/2014 Document Reviewed:  01/22/2013 Elsevier Interactive Patient Education 2016 Elsevier Inc.  

## 2015-12-12 NOTE — Telephone Encounter (Signed)
Patient requests that PT referral be made in [place]. Her best call back number is [phone]

## 2015-12-12 NOTE — Progress Notes (Signed)
Reason for visit: Stroke  Referring physician: [name] is a 75 y.o. female  History of present illness:  Bonnie Jackson is a 75 year old right-handed white female with a history of hypertension. The patient has a known basilar tip aneurysm following a syncopal episode that occurred 5 years ago. The patient has not had intervention on the aneurysm previously. The patient had gone off of her aspirin therapy prior to her recent admission to [facility] on 11/16/2015. The patient awakened with some weakness involving the left hand, the patient went back to bed, and woke up 2 hours later, noted that she could not walk. The patient went to the hospital for that reason. She underwent MRI brain evaluation that showed evidence of a right thalamus/centrum semiovale stroke event. The patient had MRA of the head that confirmed a 4 mm basilar tip aneurysm without stenosis of the intracranial vessels. Carotid Doppler studies were done showing less than 50% stenosis of the internal carotid arteries on both sides. A 2-D echocardiogram showed no cardiogenic source of embolus, ejection fraction was 65%. The patient was placed back on aspirin. She was noted initially to have an elevated blood pressure. The patient generally is well controlled with her systolic blood pressures at home ranging from around 110-120. The patient did not have numbness on the left side of the body, she has had good return of strength of the left side. She denies any visual complaints, headache, speech or swallowing problems. She does report some mild memory problems, but this did not occur with a stroke. MRI of the brain did show some pre-existing small vessel disease. She has been seen by Dr. Kathyrn Sheriff regarding her cerebral aneurysm, a cerebral angiogram has been set up for her.  Past Medical History  Diagnosis Date  . Hypertension   . Brain aneurysm     Past Surgical History  Procedure Laterality Date  . Abdominal  hernia repair    . Total abdominal hysterectomy    . Cholecystectomy    . Replacement total knee Bilateral     Family History  Problem Relation Age of Onset  . Cancer Daughter   . Breast cancer Daughter 1  . Breast cancer Mother 39  . Heart attack Father   . Dementia Sister   . Cancer Brother     lung  . Cancer Sister     pancreatic  . Cancer Brother     lung    Social history:  reports that she has never smoked. She does not have any smokeless tobacco history on file. She reports that she does not drink alcohol or use illicit drugs.  Medications:  Prior to Admission medications   Medication Sig Start Date End Date Taking? Authorizing Provider  aspirin 325 MG tablet Take 1 tablet (325 mg total) by mouth daily. 11/17/15   Dustin Flock, MD  atorvastatin (LIPITOR) 40 MG tablet Take 0.5 tablets (20 mg total) by mouth daily at 6 PM. 11/17/15   Dustin Flock, MD  lisinopril-hydrochlorothiazide (PRINZIDE,ZESTORETIC) 20-25 MG tablet Take 1 tablet by mouth daily. 11/24/15   Glean Hess, MD  traZODone (DESYREL) 50 MG tablet at bedtime.  06/04/15   Historical Provider, MD     No Known Allergies  ROS:  Out of a complete 14 system review of symptoms, the patient complains only of the following symptoms, and all other reviewed systems are negative.  Fatigue Memory loss, weakness, balance issues  Blood pressure 148/74, pulse 72, height 5'  3.5" (1.613 m), weight 178 lb (80.74 kg).  Physical Exam  General: The patient is alert and cooperative at the time of the examination.  Eyes: Pupils are equal, round, and reactive to light. Discs are flat bilaterally.  Neck: The neck is supple, no carotid bruits are noted.  Respiratory: The respiratory examination is clear.  Cardiovascular: The cardiovascular examination reveals a regular rate and rhythm, no obvious murmurs or rubs are noted.  Skin: Extremities are without significant edema.  Neurologic Exam  Mental status: The  patient is alert and oriented x 3 at the time of the examination. The patient has apparent normal recent and remote memory, with an apparently normal attention span and concentration ability.  Cranial nerves: Facial symmetry is present. There is good sensation of the face to pinprick and soft touch bilaterally. The strength of the facial muscles and the muscles to head turning and shoulder shrug are normal bilaterally. Speech is well enunciated, no aphasia or dysarthria is noted. Extraocular movements are full. Visual fields are full. The tongue is midline, and the patient has symmetric elevation of the soft palate. No obvious hearing deficits are noted.  Motor: The motor testing reveals 5 over 5 strength of all 4 extremities. Good symmetric motor tone is noted throughout.  Sensory: Sensory testing is intact to pinprick, soft touch, vibration sensation, and position sense on all 4 extremities. No evidence of extinction is noted.  Coordination: Cerebellar testing reveals good finger-nose-finger and heel-to-shin bilaterally.  Gait and station: Gait is normal. Tandem gait is unsteady. Romberg is negative. No drift is seen.  Reflexes: Deep tendon reflexes are symmetric and normal bilaterally. Toes are downgoing bilaterally.   MRI and MRA head 11/16/15:  IMPRESSION: 1. Acute lacunar infarct in the right thalamus with no associated hemorrhage or mass effect. 2. No emergent large vessel occlusion or definite associated MRA finding (See # 3). 3. 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 4. Moderate nonspecific cerebral white matter T2 and FLAIR hyperintensity, favor chronic small vessel disease in this setting. 5. Anterior circulation ACA and MCA branch detail on the MRA is degraded by motion.  * MRI scan images were reviewed online. I agree with the written report.   2D echo 11/16/15:  Study Conclusions  - Left  ventricle: The cavity size was normal. Systolic function was  normal. The estimated ejection fraction was 65%. Wall motion was  normal; there were no regional wall motion abnormalities. Doppler  parameters are consistent with abnormal left ventricular  relaxation (grade 1 diastolic dysfunction). - Aortic valve: Valve area (Vmax): 2.02 cm^2. - Atrial septum: No defect or patent foramen ovale was identified.   Carotid doppler 11/16/15:  IMPRESSION: 1. Moderate calcified plaque both carotid bifurcations and proximal ICAs. No flow limiting stenosis. Degree of stenosis less than 50% bilaterally. 2. Vertebral arteries are patent antegrade flow.  Impressions:  - No cardiac source of emboli was indentified.    Assessment/Plan:  1. Recent stroke event, right thalamic  2. Basilar tip aneurysm  3. Hypertension  The patient likely had a small vessel stroke involving the right thalamus. The patient has had a good recovery from this with minimal left-sided weakness. The patient does have some mild gait instability, she will be set up for some physical therapy. The patient will remain on her aspirin, follow-up with her primary care physician regarding her blood pressures and cholesterol. She will follow-up through this office on  an as-needed basis. The patient will be getting a cerebral angiogram to evaluate her for the basilar tip aneurysm. She is followed through neurosurgery for this.  Bonnie Alexanders MD 12/12/2015 1:48 PM  [facility] [address] [place], [place] [address]  Phone [phone] Fax [phone]

## 2015-12-19 NOTE — Telephone Encounter (Signed)
Patient is calling back and states she has not heard anything from a PT office.  Please call.

## 2015-12-19 NOTE — Telephone Encounter (Signed)
[facility] PT in [place]. PT order was placed in [facility] but [place] is w/ [facility] rather than [facility]. Faxed referral to [phone] as requested. Spoke to pt and she will again call back if PT is not set up for her in the next few days.

## 2015-12-19 NOTE — Telephone Encounter (Signed)
Patient is calling back and would like to go to [facility] in [place] for PT.

## 2015-12-20 ENCOUNTER — Other Ambulatory Visit: Payer: Self-pay | Admitting: Neurology

## 2015-12-20 DIAGNOSIS — I6322 Cerebral infarction due to unspecified occlusion or stenosis of basilar arteries: Principal | ICD-10-CM

## 2015-12-20 DIAGNOSIS — I6381 Other cerebral infarction due to occlusion or stenosis of small artery: Secondary | ICD-10-CM

## 2015-12-20 DIAGNOSIS — I63211 Cerebral infarction due to unspecified occlusion or stenosis of right vertebral arteries: Secondary | ICD-10-CM

## 2015-12-20 DIAGNOSIS — R269 Unspecified abnormalities of gait and mobility: Secondary | ICD-10-CM

## 2015-12-20 NOTE — Telephone Encounter (Signed)
Pt called inquiring if PT referral was sent to [facility]. Operator advised it was faxed yesterday. Pt is going to call today to schedule appt.

## 2015-12-21 ENCOUNTER — Ambulatory Visit: Payer: Medicare Other | Attending: Neurology | Admitting: Physical Therapy

## 2015-12-21 ENCOUNTER — Encounter: Payer: Self-pay | Admitting: Physical Therapy

## 2015-12-21 DIAGNOSIS — M6281 Muscle weakness (generalized): Secondary | ICD-10-CM

## 2015-12-21 DIAGNOSIS — R262 Difficulty in walking, not elsewhere classified: Secondary | ICD-10-CM | POA: Insufficient documentation

## 2015-12-21 DIAGNOSIS — Z9181 History of falling: Secondary | ICD-10-CM | POA: Diagnosis not present

## 2015-12-22 NOTE — Therapy (Deleted)
[facility] [address] [place], [place], [address] Phone: [phone]   Fax:  [phone]  Physical Therapy Evaluation  Patient Details  Name: Bonnie Jackson MRN: [id number] Date of Birth: [birth year] Referring Provider: Dr. Margette Fast  Encounter Date: 12/21/2015    Past Medical History  Diagnosis Date  . Hypertension   . Brain aneurysm     Past Surgical History  Procedure Laterality Date  . Abdominal hernia repair    . Total abdominal hysterectomy    . Cholecystectomy    . Replacement total knee Bilateral     There were no vitals filed for this visit.              Patient will benefit from skilled therapeutic intervention in order to improve the following deficits and impairments:  Abnormal gait, Decreased endurance, Decreased activity tolerance, Decreased strength, Difficulty walking, Decreased balance, Decreased mobility, Decreased coordination, Postural dysfunction, Improper body mechanics, Decreased range of motion  Visit Diagnosis: Difficulty in walking, not elsewhere classified  Muscle weakness (generalized)  Risk for falls     Problem List Patient Active Problem List   Diagnosis Date Noted  . CVA (cerebral infarction) 11/16/2015  . Aneurysm, cerebral, nonruptured 01/04/2015  . Essential (primary) hypertension 01/04/2015  . HLD (hyperlipidemia) 01/04/2015  . Routine general medical examination at a health care facility 01/04/2015  . Need for vaccination 01/04/2015  . Idiopathic insomnia 01/04/2015  . Cutaneous eruption 01/04/2015  . Special screening for malignant neoplasms, colon 01/04/2015   Pura Spice, PT, DPT # [phone]   12/25/2015, 7:46 PM   [facility] [address] [place], [place], [address] Phone: [phone]   Fax:  [phone]  Name: Bonnie Jackson MRN: [id number] Date of Birth: 10/22/1940

## 2015-12-25 ENCOUNTER — Ambulatory Visit: Payer: Medicare Other | Admitting: Physical Therapy

## 2015-12-25 DIAGNOSIS — Z9181 History of falling: Secondary | ICD-10-CM | POA: Diagnosis not present

## 2015-12-25 DIAGNOSIS — M6281 Muscle weakness (generalized): Secondary | ICD-10-CM | POA: Diagnosis not present

## 2015-12-25 DIAGNOSIS — R262 Difficulty in walking, not elsewhere classified: Secondary | ICD-10-CM

## 2015-12-25 NOTE — Therapy (Addendum)
[facility] [address] [place], [place], [address] Phone: [phone]   Fax:  [phone]  Physical Therapy Evaluation  Patient Details  Name: Bonnie Jackson MRN: [id number] Date of Birth: 06/10/1941 Referring Provider: Dr. Margette Fast  Encounter Date: 12/21/2015    Past Medical History  Diagnosis Date  . Hypertension   . Brain aneurysm     Past Surgical History  Procedure Laterality Date  . Abdominal hernia repair    . Total abdominal hysterectomy    . Cholecystectomy    . Replacement total knee Bilateral     There were no vitals filed for this visit.     Pt. reports difficutly with walking/ balance.  Pt. states she feels strong.    There.ex: see handouts.     Pt. is a 75 y/o female s/p CVA on 11/16/15 with h/o brain aneurysm.  Pt. has h/o B TKA and recent fall as a result of limited knee flexion.  Pt. presents with no LE swelling but has L LE numbness.  Decrease B knee AROM: L (0 to 94 deg.), R (0 to 100 deg.).  B LE muscle weakness: B hip flexion 4/5, B hip IR/ER 4/5, B quads, hamstring, DF 4+/5 MMT.  Pt. ambulates with slight L antalgic gait pattern with limited B hip flexion/ heel strike with no assistive device.  Berg balance test: 48/56.  LEFS: 30 out of 80.  Pt. will benefit from skilled PT services to increase B LE muscle strength to improve balance/ decrease fall risk.           PT Long Term Goals - 12/25/15 1942    PT LONG TERM GOAL #1   Title Pt. I with HEP to increase B LE muscle strength 1/2 muscle grade to improve standing/ walking.     Baseline  B LE muscle weakness: B hip flexion 4/5, B hip IR/ER 4/5, B quads, hamstring, DF 4+/5 MMT.    Time 4   Period Weeks   Status New   PT LONG TERM GOAL #2   Title Pt. will increase LEFS to >50 out of 80 to improve overall functional mobility.    Baseline LEFS: 30 out of 80 on 5/18   Time 4   Period Weeks   Status New   PT LONG TERM GOAL #3   Title Pt. will  increase Berg balance test to >52/56 to decrease fall risk/ improve walking.    Baseline Berg: 48/56 on 5/18   Time 4   Period Weeks   Status New   PT LONG TERM GOAL #4   Title Pt. able to ambulate with least assistive device and normalized gait pattern to improve walking/ decrease fall risk.    Baseline L antalgic gait.  Benefits from [facility]   Time 4   Period Weeks   Status New             Patient will benefit from skilled therapeutic intervention in order to improve the following deficits and impairments:  Abnormal gait, Decreased endurance, Decreased activity tolerance, Decreased strength, Difficulty walking, Decreased balance, Decreased mobility, Decreased coordination, Postural dysfunction, Improper body mechanics, Decreased range of motion  Visit Diagnosis: Difficulty in walking, not elsewhere classified  Muscle weakness (generalized)  Risk for falls     Problem List Patient Active Problem List   Diagnosis Date Noted  . CVA (cerebral infarction) 11/16/2015  . Aneurysm, cerebral, nonruptured 01/04/2015  . Essential (primary) hypertension 01/04/2015  . HLD (hyperlipidemia)  01/04/2015  . Routine general medical examination at a health care facility 01/04/2015  . Need for vaccination 01/04/2015  . Idiopathic insomnia 01/04/2015  . Cutaneous eruption 01/04/2015  . Special screening for malignant neoplasms, colon 01/04/2015   Pura Spice, PT, DPT # [phone]   12/25/2015, 7:47 PM  [facility] [address] [place], [place], [address] Phone: [phone]   Fax:  [phone]  Name: Bonnie Jackson MRN: [id number] Date of Birth: 03-Apr-1941

## 2015-12-25 NOTE — Therapy (Signed)
[place] [facility] [address]. [place], [place], [address] Phone: [phone]   Fax:  [phone]  Physical Therapy Treatment  Patient Details  Name: Bonnie Jackson MRN: [id number] Date of Birth: 1941/01/08 Referring Provider: Dr. Margette Fast  Encounter Date: 12/25/2015      PT End of Session - 12/25/15 2001    Visit Number 2   Number of Visits 8   Date for PT Re-Evaluation 01/18/16   Authorization - Visit Number 2   Authorization - Number of Visits 10   PT Start Time [phone]   PT Stop Time 0944   PT Time Calculation (min) 52 min   Equipment Utilized During Treatment Gait belt   Activity Tolerance Patient tolerated treatment well   Behavior During Therapy [facility] for tasks assessed/performed      Past Medical History  Diagnosis Date  . Hypertension   . Brain aneurysm     Past Surgical History  Procedure Laterality Date  . Abdominal hernia repair    . Total abdominal hysterectomy    . Cholecystectomy    . Replacement total knee Bilateral     There were no vitals filed for this visit.      Subjective Assessment - 12/25/15 1957    Subjective No new complaints.  Pt. states she is grumpy this morning and feels tired all over.  Pt. frustrated with her lack of energy.  No falls.    Limitations Walking;House hold activities;Standing   Patient Stated Goals Improve walking/ balance   Currently in Pain? No/denies       OBJECTIVE: There.ex.: Nustep L6 B LE only (warm-up/nocharge).  Reviewed HEP.  Neuro:  Tandem/ SLS (difficulty without UE assist). Airex step ups/ marching/ heel raises 20x.  6" step ups without UE assist (added Airex).  Cone taps L/R (cuing for posture/ mirror feedback/ UE assist).  Amb. In clinic with 2-point gait pattern and use of SPC.  Pt. Benefits from [facility] but is not sold on home use due to feeling old with use.     Pt response for medical necessity: Pt. Benefits from skilled PT services to increase B LE muscle  strength to improve balance/ gait independence.  Moderate B hip flexion muscle weakness and difficulty with cone taps/ wt. Shifting without UE assist.        PT Long Term Goals - 12/25/15 1942    PT LONG TERM GOAL #1   Title Pt. I with HEP to increase B LE muscle strength 1/2 muscle grade to improve standing/ walking.     Baseline  B LE muscle weakness: B hip flexion 4/5, B hip IR/ER 4/5, B quads, hamstring, DF 4+/5 MMT.    Time 4   Period Weeks   Status New   PT LONG TERM GOAL #2   Title Pt. will increase LEFS to >50 out of 80 to improve overall functional mobility.    Baseline LEFS: 30 out of 80 on 5/18   Time 4   Period Weeks   Status New   PT LONG TERM GOAL #3   Title Pt. will increase Berg balance test to >52/56 to decrease fall risk/ improve walking.    Baseline Berg: 48/56 on 5/18   Time 4   Period Weeks   Status New   PT LONG TERM GOAL #4   Title Pt. able to ambulate with least assistive device and normalized gait pattern to improve walking/ decrease fall risk.    Baseline L antalgic gait.  Benefits from [facility]   Time 4   Period Weeks   Status New               Plan - 12/25/15 2002    Clinical Impression Statement Pts. gait pattern improves significantly with use of SPC and 2-point gait pattern.   Pt. states she does better with [facility] but is not wanting to use [facility] because it makes her feel/ look old.  Difficulty with cone taps without UE assist.  Moderate LE muscle fatigue with Airex ex./ standing ther.ex.    Rehab Potential Good   PT Frequency 2x / week   PT Duration 4 weeks   PT Treatment/Interventions ADLs/Self Care Home Management;Aquatic Therapy;Functional mobility training;Stair training;Gait training;Therapeutic activities;Therapeutic exercise;Balance training;Patient/family education;Neuromuscular re-education   PT Next Visit Plan Progress LE muscle strength (focus on L LE).  Balance issues.    PT Home Exercise Plan See handouts.    Consulted and Agree with  Plan of Care Patient      Patient will benefit from skilled therapeutic intervention in order to improve the following deficits and impairments:  Abnormal gait, Decreased endurance, Decreased activity tolerance, Decreased strength, Difficulty walking, Decreased balance, Decreased mobility, Decreased coordination, Postural dysfunction, Improper body mechanics, Decreased range of motion  Visit Diagnosis: Difficulty in walking, not elsewhere classified  Muscle weakness (generalized)  Risk for falls     Problem List Patient Active Problem List   Diagnosis Date Noted  . CVA (cerebral infarction) 11/16/2015  . Aneurysm, cerebral, nonruptured 01/04/2015  . Essential (primary) hypertension 01/04/2015  . HLD (hyperlipidemia) 01/04/2015  . Routine general medical examination at a health care facility 01/04/2015  . Need for vaccination 01/04/2015  . Idiopathic insomnia 01/04/2015  . Cutaneous eruption 01/04/2015  . Special screening for malignant neoplasms, colon 01/04/2015   Pura Spice, PT, DPT # [phone]   12/25/2015, 8:07 PM  [place] [facility] [address] [place], [place], [address] Phone: [phone]   Fax:  [phone]  Name: Bonnie Jackson MRN: [id number] Date of Birth: 03/13/1941

## 2015-12-25 NOTE — Addendum Note (Signed)
Addended by: Pura Spice on: 12/25/2015 07:52 PM   Modules accepted: Orders

## 2015-12-27 ENCOUNTER — Ambulatory Visit: Payer: Medicare Other | Admitting: Physical Therapy

## 2015-12-27 DIAGNOSIS — R262 Difficulty in walking, not elsewhere classified: Secondary | ICD-10-CM

## 2015-12-27 DIAGNOSIS — Z9181 History of falling: Secondary | ICD-10-CM

## 2015-12-27 DIAGNOSIS — M6281 Muscle weakness (generalized): Secondary | ICD-10-CM | POA: Diagnosis not present

## 2015-12-27 NOTE — Therapy (Signed)
[facility] [address]. [place], [place], [address] Phone: [phone]   Fax:  [phone]  Physical Therapy Treatment  Patient Details  Name: Bonnie Jackson MRN: [id number] Date of Birth: Jul 18, 1941 Referring Provider: Dr. Margette Fast  Encounter Date: 12/27/2015      PT End of Session - 12/27/15 0815    Visit Number 3   Number of Visits 8   Date for PT Re-Evaluation 01/18/16   Authorization - Visit Number 3   Authorization - Number of Visits 10   PT Start Time 0813   PT Stop Time 0905   PT Time Calculation (min) 52 min   Equipment Utilized During Treatment Gait belt   Activity Tolerance Patient tolerated treatment well   Behavior During Therapy [facility] for tasks assessed/performed      Past Medical History  Diagnosis Date  . Hypertension   . Brain aneurysm     Past Surgical History  Procedure Laterality Date  . Abdominal hernia repair    . Total abdominal hysterectomy    . Cholecystectomy    . Replacement total knee Bilateral     There were no vitals filed for this visit.      Subjective Assessment - 12/27/15 0814    Subjective Pt. states she is doing alright today.  Pt. states she is not grumpy today.  No stumbles/ trips/ falls.     Limitations Walking;House hold activities;Standing   Patient Stated Goals Improve walking/ balance   Currently in Pain? No/denies      OBJECTIVE: There.ex.: Nustep L6 B LE only 10 min. (warm-up/nocharge).  Sit to stands from blue mat table 15x.  Standing partial lunges L/R 10x with no UE assist.  TG knee flexion 30x/ heel raises 20x.  BOSU step ups/downs in //-bars (attempted standing with no UE assist, able to stand for 10 sec.). Neuro: Tandem/ SLS (difficulty without UE assist).  Functional reaching: cone reaching L/R.  Gait:  Amb. In clinic with 2-point gait pattern and use of SPC. Pt. Benefits from [facility] with all aspects of walking, esp. Outside.  Discussed pts. QC that they brought  and recommended use of SPC (lightweight).      Pt response for medical necessity: Pt. Benefits from skilled PT services to increase B LE muscle strength to improve balance/ gait independence. Pt. Continues to benefit from [facility] for safety with gait (all surfaces).  No LOB but CGA/min. A with BOSU step ups progressing to SBA/CGA after several reps.         PT Long Term Goals - 12/25/15 1942    PT LONG TERM GOAL #1   Title Pt. I with HEP to increase B LE muscle strength 1/2 muscle grade to improve standing/ walking.     Baseline  B LE muscle weakness: B hip flexion 4/5, B hip IR/ER 4/5, B quads, hamstring, DF 4+/5 MMT.    Time 4   Period Weeks   Status New   PT LONG TERM GOAL #2   Title Pt. will increase LEFS to >50 out of 80 to improve overall functional mobility.    Baseline LEFS: 30 out of 80 on 5/18   Time 4   Period Weeks   Status New   PT LONG TERM GOAL #3   Title Pt. will increase Berg balance test to >52/56 to decrease fall risk/ improve walking.    Baseline Berg: 48/56 on 5/18   Time 4   Period Weeks   Status New  PT LONG TERM GOAL #4   Title Pt. able to ambulate with least assistive device and normalized gait pattern to improve walking/ decrease fall risk.    Baseline L antalgic gait.  Benefits from [facility]   Time 4   Period Weeks   Status New             Plan - 12/27/15 1246    Clinical Impression Statement Pt. ambulates with consistent step pattern/ cadence/ upright posture with use of SPC inside and outside surfaces (parking lot/ ramp/ curbs).    PT Frequency 2x / week   PT Duration 4 weeks   PT Treatment/Interventions ADLs/Self Care Home Management;Aquatic Therapy;Functional mobility training;Stair training;Gait training;Therapeutic activities;Therapeutic exercise;Balance training;Patient/family education;Neuromuscular re-education   PT Next Visit Plan Progress LE muscle strength/ balance tasks.   Discuss how walking over weekend.    PT Home Exercise  Plan See handouts.    Consulted and Agree with Plan of Care Patient      Patient will benefit from skilled therapeutic intervention in order to improve the following deficits and impairments:  Abnormal gait, Decreased endurance, Decreased activity tolerance, Decreased strength, Difficulty walking, Decreased balance, Decreased mobility, Decreased coordination, Postural dysfunction, Improper body mechanics, Decreased range of motion  Visit Diagnosis: Difficulty in walking, not elsewhere classified  Muscle weakness (generalized)  Risk for falls     Problem List Patient Active Problem List   Diagnosis Date Noted  . CVA (cerebral infarction) 11/16/2015  . Aneurysm, cerebral, nonruptured 01/04/2015  . Essential (primary) hypertension 01/04/2015  . HLD (hyperlipidemia) 01/04/2015  . Routine general medical examination at a health care facility 01/04/2015  . Need for vaccination 01/04/2015  . Idiopathic insomnia 01/04/2015  . Cutaneous eruption 01/04/2015  . Special screening for malignant neoplasms, colon 01/04/2015   Pura Spice, PT, DPT # [id number]   12/27/2015, 12:50 PM  [facility] [address] [place], [place], [address] Phone: [phone]   Fax:  [phone]  Name: Bonnie Jackson MRN: [id number] Date of Birth: [birth year]

## 2016-01-02 ENCOUNTER — Ambulatory Visit (HOSPITAL_COMMUNITY): Payer: Medicare Other

## 2016-01-03 ENCOUNTER — Encounter: Payer: Self-pay | Admitting: Physical Therapy

## 2016-01-03 ENCOUNTER — Ambulatory Visit: Payer: Medicare Other | Admitting: Physical Therapy

## 2016-01-03 DIAGNOSIS — R262 Difficulty in walking, not elsewhere classified: Secondary | ICD-10-CM | POA: Diagnosis not present

## 2016-01-03 DIAGNOSIS — M6281 Muscle weakness (generalized): Secondary | ICD-10-CM

## 2016-01-03 DIAGNOSIS — Z9181 History of falling: Secondary | ICD-10-CM

## 2016-01-03 NOTE — Therapy (Signed)
[place] [facility] [address] [place], [place], [address] Phone: [phone]   Fax:  [phone]  Physical Therapy Treatment  Patient Details  Name: Bonnie Jackson MRN: [id number] Date of Birth: 06-08-1941 Referring Provider: Dr. Margette Fast  Encounter Date: 01/03/2016      PT End of Session - 01/03/16 1946    Visit Number 4   Number of Visits 8   Date for PT Re-Evaluation 01/18/16   Authorization - Visit Number 4   Authorization - Number of Visits 10   PT Start Time 1008   PT Stop Time 1101   PT Time Calculation (min) 53 min   Equipment Utilized During Treatment Gait belt   Activity Tolerance Patient tolerated treatment well   Behavior During Therapy [facility] for tasks assessed/performed      Past Medical History  Diagnosis Date  . Hypertension   . Brain aneurysm     Past Surgical History  Procedure Laterality Date  . Abdominal hernia repair    . Total abdominal hysterectomy    . Cholecystectomy    . Replacement total knee Bilateral     There were no vitals filed for this visit.      Subjective Assessment - 01/03/16 1944    Subjective Pt. went to [place] this past weekend and reports difficulty walking (no falls).  Pt. pushed pet stroller with dog in it.  Pt. frustrated with not being able to do what she could before stroke.  Pt. reports being easily fatigued.     Limitations Walking;House hold activities;Standing   Patient Stated Goals Improve walking/ balance   Currently in Pain? No/denies      OBJECTIVE: There.ex.: Scifit L6 B LE only 10 min. (warm-up/nocharge)- initial difficulty.  Standing hip ex./ reviewed HEP.  Neuro:tandem (forward/ backwards)/ SLS (difficulty without UE assist)- pt. Frustrated.  Side stepping (mirror feedback).  Resisted gait 1BTB 5x all 4-planes. Hallway activities: alt. UE/LE, braiding (in front) L and R.  Gait: Amb. In clinic with 2-point gait pattern and use of SPC.Ambulate outside on grassy  terrain/ sidewalk/ uneven terrain with and without SPC.    Pt response for medical necessity: Pt. Benefits from skilled PT services to increase B LE muscle strength to improve balance/ gait independence. Pt. Continues to benefit from [facility] for safety with gait (all surfaces).      PT Long Term Goals - 12/25/15 1942    PT LONG TERM GOAL #1   Title Pt. I with HEP to increase B LE muscle strength 1/2 muscle grade to improve standing/ walking.     Baseline  B LE muscle weakness: B hip flexion 4/5, B hip IR/ER 4/5, B quads, hamstring, DF 4+/5 MMT.    Time 4   Period Weeks   Status New   PT LONG TERM GOAL #2   Title Pt. will increase LEFS to >50 out of 80 to improve overall functional mobility.    Baseline LEFS: 30 out of 80 on 5/18   Time 4   Period Weeks   Status New   PT LONG TERM GOAL #3   Title Pt. will increase Berg balance test to >52/56 to decrease fall risk/ improve walking.    Baseline Berg: 48/56 on 5/18   Time 4   Period Weeks   Status New   PT LONG TERM GOAL #4   Title Pt. able to ambulate with least assistive device and normalized gait pattern to improve walking/ decrease fall risk.  Baseline L antalgic gait.  Benefits from [facility]   Time 4   Period Weeks   Status New               Plan - 01/03/16 1947    Clinical Impression Statement Pt. ambulates in grassy/ uneven terrain with SBA/CGA for safety without use of SPC.  Pt. instructed in [facility] use again and PT really recommended for safety with walking and to decrease fall risk.  Pt. pessimistic t/o tx. session and PT really tried to reinforce pt. and importance of working hard/ not gving up.     Rehab Potential Good   PT Frequency 2x / week   PT Duration 4 weeks   PT Treatment/Interventions ADLs/Self Care Home Management;Aquatic Therapy;Functional mobility training;Stair training;Gait training;Therapeutic activities;Therapeutic exercise;Balance training;Patient/family education;Neuromuscular re-education    PT Next Visit Plan Progress LE muscle strength/ balance tasks.  Check SPC use/ proper sizing.    PT Home Exercise Plan See handouts.    Consulted and Agree with Plan of Care Patient      Patient will benefit from skilled therapeutic intervention in order to improve the following deficits and impairments:  Abnormal gait, Decreased endurance, Decreased activity tolerance, Decreased strength, Difficulty walking, Decreased balance, Decreased mobility, Decreased coordination, Postural dysfunction, Improper body mechanics, Decreased range of motion  Visit Diagnosis: Difficulty in walking, not elsewhere classified  Muscle weakness (generalized)  Risk for falls     Problem List Patient Active Problem List   Diagnosis Date Noted  . CVA (cerebral infarction) 11/16/2015  . Aneurysm, cerebral, nonruptured 01/04/2015  . Essential (primary) hypertension 01/04/2015  . HLD (hyperlipidemia) 01/04/2015  . Routine general medical examination at a health care facility 01/04/2015  . Need for vaccination 01/04/2015  . Idiopathic insomnia 01/04/2015  . Cutaneous eruption 01/04/2015  . Special screening for malignant neoplasms, colon 01/04/2015   Pura Spice, PT, DPT # [id number]   01/03/2016, 7:52 PM  [place] [facility] [address] [place], [place], [address] Phone: [phone]   Fax:  [phone]  Name: Verdene Emanuelson MRN: [id number] Date of Birth: 1941-07-15

## 2016-01-05 ENCOUNTER — Ambulatory Visit: Payer: Medicare Other | Attending: Neurology | Admitting: Physical Therapy

## 2016-01-05 DIAGNOSIS — H40003 Preglaucoma, unspecified, bilateral: Secondary | ICD-10-CM | POA: Diagnosis not present

## 2016-01-05 DIAGNOSIS — M6281 Muscle weakness (generalized): Secondary | ICD-10-CM | POA: Diagnosis not present

## 2016-01-05 DIAGNOSIS — Z9181 History of falling: Secondary | ICD-10-CM | POA: Insufficient documentation

## 2016-01-05 DIAGNOSIS — R262 Difficulty in walking, not elsewhere classified: Secondary | ICD-10-CM | POA: Diagnosis not present

## 2016-01-05 NOTE — Therapy (Signed)
[facility] [address] [place], [place], [address] Phone: [phone]   Fax:  [phone]  Physical Therapy Treatment  Patient Details  Name: Bonnie Jackson MRN: [id number] Date of Birth: September 02, 1940 Referring Provider: Dr. Margette Fast  Encounter Date: 01/05/2016      PT End of Session - 01/05/16 1216    Visit Number 5   Number of Visits 8   Date for PT Re-Evaluation 01/18/16   Authorization - Visit Number 5   Authorization - Number of Visits 10   PT Start Time [id number]   PT Stop Time 1156   PT Time Calculation (min) 54 min   Activity Tolerance Patient tolerated treatment well   Behavior During Therapy [facility] for tasks assessed/performed      Past Medical History  Diagnosis Date  . Hypertension   . Brain aneurysm     Past Surgical History  Procedure Laterality Date  . Abdominal hernia repair    . Total abdominal hysterectomy    . Cholecystectomy    . Replacement total knee Bilateral     There were no vitals filed for this visit.      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  Pt. brought SPC to PT clinic today.  No new complaints.     Limitations Walking;House hold activities;Standing   Patient Stated Goals Improve walking/ balance   Currently in Pain? No/denies      OBJECTIVE: There.ex.: Scifit L7 B LE only 10 min. (warm-up/nocharge)- improved knee flexion with initial use.  Step ups/ downs on 6" step with no UE assist 15x (cuing for posture).  Hip abd. In hallway.  B carrying with 20# box 45 feet x2.  Neuro:tandem/ SLS (difficulty without UE assist)- modified tandem. Walking backwards/ side stepping in hallway with no UE assist 45 feet x 4. Hallway activities: alt. UE/LE (hip flexion/ knee ext.). Amb. outside on grassy terrain/ sidewalk/ uneven terrain with and without SPC.    Pt response for medical necessity:  Pt. Benefits from skilled PT services to increase B LE muscle strength to improve balance/ gait independence. Pt. Continues to benefit from [facility] for safety with gait (all surfaces).      PT Long Term Goals - 12/25/15 1942    PT LONG TERM GOAL #1   Title Pt. I with HEP to increase B LE muscle strength 1/2 muscle grade to improve standing/ walking.     Baseline  B LE muscle weakness: B hip flexion 4/5, B hip IR/ER 4/5, B quads, hamstring, DF 4+/5 MMT.    Time 4   Period Weeks   Status New   PT LONG TERM GOAL #2   Title Pt. will increase LEFS to >50 out of 80 to improve overall functional mobility.    Baseline LEFS: 30 out of 80 on 5/18   Time 4   Period Weeks   Status New   PT LONG TERM GOAL #3   Title Pt. will increase Berg balance test to >52/56 to decrease fall risk/ improve walking.    Baseline Berg: 48/56 on 5/18   Time 4   Period Weeks   Status New   PT LONG TERM GOAL #4   Title Pt. able to ambulate with least assistive device and normalized gait pattern to improve walking/ decrease fall risk.    Baseline L antalgic gait.  Benefits from [facility]   Time  4   Period Weeks   Status New             Plan - 01/05/16 1216    Clinical Impression Statement Pt. able to stand of BOSU for 10 sec. with no UE assist after several attempts.  Pt. ambulates with mod. I and use of SPC or SBA/CGA with no assistive device safely.  No LOB during tx. session but occasional use of UE assist with higher level tasks.  2 short seated rest breaks during ther.ex.    Rehab Potential Good   PT Frequency 2x / week   PT Duration 4 weeks   PT Treatment/Interventions ADLs/Self Care Home Management;Aquatic Therapy;Functional mobility training;Stair training;Gait training;Therapeutic activities;Therapeutic exercise;Balance training;Patient/family education;Neuromuscular re-education   PT Next Visit Plan Progress LE muscle strength/ balance tasks.     PT Home Exercise Plan See handouts.    Consulted and Agree  with Plan of Care Patient      Patient will benefit from skilled therapeutic intervention in order to improve the following deficits and impairments:  Abnormal gait, Decreased endurance, Decreased activity tolerance, Decreased strength, Difficulty walking, Decreased balance, Decreased mobility, Decreased coordination, Postural dysfunction, Improper body mechanics, Decreased range of motion  Visit Diagnosis: Difficulty in walking, not elsewhere classified  Muscle weakness (generalized)  Risk for falls     Problem List Patient Active Problem List   Diagnosis Date Noted  . CVA (cerebral infarction) 11/16/2015  . Aneurysm, cerebral, nonruptured 01/04/2015  . Essential (primary) hypertension 01/04/2015  . HLD (hyperlipidemia) 01/04/2015  . Routine general medical examination at a health care facility 01/04/2015  . Need for vaccination 01/04/2015  . Idiopathic insomnia 01/04/2015  . Cutaneous eruption 01/04/2015  . Special screening for malignant neoplasms, colon 01/04/2015   Pura Spice, PT, DPT # [id number]   01/05/2016, 12:18 PM  [facility] [address] [place], [place], [address] Phone: [phone]   Fax:  [phone]  Name: Bonnie Jackson MRN: [id number] Date of Birth: 22-May-1941

## 2016-01-08 ENCOUNTER — Ambulatory Visit: Payer: Medicare Other | Admitting: Physical Therapy

## 2016-01-08 ENCOUNTER — Encounter: Payer: Self-pay | Admitting: Physical Therapy

## 2016-01-08 DIAGNOSIS — R262 Difficulty in walking, not elsewhere classified: Secondary | ICD-10-CM

## 2016-01-08 DIAGNOSIS — M6281 Muscle weakness (generalized): Secondary | ICD-10-CM | POA: Diagnosis not present

## 2016-01-08 DIAGNOSIS — Z9181 History of falling: Secondary | ICD-10-CM

## 2016-01-08 NOTE — Therapy (Signed)
[facility] [address] [place], [place], [address] Phone: [phone]   Fax:  [phone]  Physical Therapy Treatment  Patient Details  Name: Bonnie Jackson MRN: [id number] Date of Birth: 16-Jul-1941 Referring Provider: Dr. Margette Fast  Encounter Date: 01/08/2016      PT End of Session - 01/08/16 0928    Visit Number 6   Number of Visits 8   Date for PT Re-Evaluation 01/18/16   Authorization - Visit Number 6   Authorization - Number of Visits 10   PT Start Time [id number]   PT Stop Time 0951   PT Time Calculation (min) 54 min   Equipment Utilized During Treatment Gait belt   Activity Tolerance Patient tolerated treatment well   Behavior During Therapy [facility] for tasks assessed/performed      Past Medical History  Diagnosis Date  . Hypertension   . Brain aneurysm     Past Surgical History  Procedure Laterality Date  . Abdominal hernia repair    . Total abdominal hysterectomy    . Cholecystectomy    . Replacement total knee Bilateral     There were no vitals filed for this visit.      Subjective Assessment - 01/08/16 1709    Subjective Pt. states she had a near fall/ LOB while attempting balance tasks in hallway due to bedroom door being open.  Pt. still benefits from use of SPC with walking on outdoor surfaces.    Limitations Walking;House hold activities;Standing   Patient Stated Goals Improve walking/ balance   Currently in Pain? No/denies      OBJECTIVE: There.ex.: Scifit L7 B LE only 10 min. (warm-up/nocharge). Step ups/ downs with no UE assist on R/L 10x2 each (in //-bars with mirror feedback but difficulty maintaining upright posture/ head position).   Neuro: Side stepping in hallway with no UE assist 45 feet x 4. Hallway activities: alt. UE/LE (hip flexion/ knee ext.)/ EC walking/ Functional reaching for cones/ Cone taps with lateral walking/ picking up cones. Amb. outside on grassy terrain/ sidewalk/ uneven terrain  with and without SPC.    Pt response for medical necessity: Pt. Benefits from skilled PT services to increase B LE muscle strength to improve balance/ gait independence. Pt. Continues to benefit from [facility] for safety with gait (all surfaces).       PT Long Term Goals - 12/25/15 1942    PT LONG TERM GOAL #1   Title Pt. I with HEP to increase B LE muscle strength 1/2 muscle grade to improve standing/ walking.     Baseline  B LE muscle weakness: B hip flexion 4/5, B hip IR/ER 4/5, B quads, hamstring, DF 4+/5 MMT.    Time 4   Period Weeks   Status New   PT LONG TERM GOAL #2   Title Pt. will increase LEFS to >50 out of 80 to improve overall functional mobility.    Baseline LEFS: 30 out of 80 on 5/18   Time 4   Period Weeks   Status New   PT LONG TERM GOAL #3   Title Pt. will increase Berg balance test to >52/56 to decrease fall risk/ improve walking.    Baseline Berg: 48/56 on 5/18   Time 4   Period Weeks   Status New   PT LONG TERM GOAL #4   Title Pt. able to ambulate with least assistive device and normalized gait pattern to improve walking/ decrease fall risk.    Baseline  L antalgic gait.  Benefits from [facility]   Time 4   Period Weeks   Status New            Plan - 01/08/16 1712    Clinical Impression Statement Difficulty with higher level balance tasks with lean/walking preference to R side.  No LOB during tx. but occasional use of UE to check balance with wt. shifting/ cone taps/ picking up objects.  Pt. c/o less overall muscle fatigue/ deconditioning during tx. sessions.    Rehab Potential Good   PT Frequency 2x / week   PT Duration 4 weeks   PT Next Visit Plan Progress LE muscle strength/ balance tasks.     PT Home Exercise Plan See handouts.    Recommended Other Services Silver Sneakers   Consulted and Agree with Plan of Care Patient      Patient will benefit from skilled therapeutic intervention in order to improve the following deficits and  impairments:  Abnormal gait, Decreased endurance, Decreased activity tolerance, Decreased strength, Difficulty walking, Decreased balance, Decreased mobility, Decreased coordination, Postural dysfunction, Improper body mechanics, Decreased range of motion  Visit Diagnosis: Difficulty in walking, not elsewhere classified  Muscle weakness (generalized)  Risk for falls     Problem List Patient Active Problem List   Diagnosis Date Noted  . CVA (cerebral infarction) 11/16/2015  . Aneurysm, cerebral, nonruptured 01/04/2015  . Essential (primary) hypertension 01/04/2015  . HLD (hyperlipidemia) 01/04/2015  . Routine general medical examination at a health care facility 01/04/2015  . Need for vaccination 01/04/2015  . Idiopathic insomnia 01/04/2015  . Cutaneous eruption 01/04/2015  . Special screening for malignant neoplasms, colon 01/04/2015   Pura Spice, PT, DPT # [phone]   01/08/2016, 5:20 PM  [facility] [address] [place], [place], [address] Phone: [phone]   Fax:  [phone]  Name: Deedee Covin MRN: [id number] Date of Birth: [birth year]

## 2016-01-10 ENCOUNTER — Ambulatory Visit: Payer: Medicare Other | Admitting: Physical Therapy

## 2016-01-10 ENCOUNTER — Encounter: Payer: Self-pay | Admitting: Physical Therapy

## 2016-01-10 DIAGNOSIS — M6281 Muscle weakness (generalized): Secondary | ICD-10-CM | POA: Diagnosis not present

## 2016-01-10 DIAGNOSIS — R262 Difficulty in walking, not elsewhere classified: Secondary | ICD-10-CM | POA: Diagnosis not present

## 2016-01-10 DIAGNOSIS — Z9181 History of falling: Secondary | ICD-10-CM

## 2016-01-10 NOTE — Therapy (Signed)
[facility] [address] [place], [place], [address] Phone: [phone]   Fax:  [phone]  Physical Therapy Treatment  Patient Details  Name: Bonnie Jackson MRN: [id number] Date of Birth: [birth year] Referring Provider: Dr. Margette Fast  Encounter Date: 01/10/2016      PT End of Session - 01/10/16 0941    Visit Number 7   Number of Visits 8   Date for PT Re-Evaluation 01/18/16   Authorization - Visit Number 7   Authorization - Number of Visits 10   PT Start Time 0853   PT Stop Time 0943   PT Time Calculation (min) 50 min   Equipment Utilized During Treatment Gait belt   Activity Tolerance Patient tolerated treatment well   Behavior During Therapy [facility] for tasks assessed/performed      Past Medical History  Diagnosis Date  . Hypertension   . Brain aneurysm     Past Surgical History  Procedure Laterality Date  . Abdominal hernia repair    . Total abdominal hysterectomy    . Cholecystectomy    . Replacement total knee Bilateral     There were no vitals filed for this visit.      Subjective Assessment - 01/10/16 0939    Subjective Pt. reports she is doing well and today is her 75th birthday.  Pt. reports no falls and carrying cane while walking into PT session.     Limitations Walking;House hold activities;Standing   Patient Stated Goals Improve walking/ balance   Currently in Pain? No/denies      OBJECTIVE: There.ex.: Scifit L7 B LE only 10 min. (warm-up/nocharge). Step ups/ downs with no UE assist on R/L 10x2 each (improved R quad control).  Reviewed standing HEP/ walking. Neuro:  Obstacle course in clinic (step ups/ Airex/ maneuvering cones/ cone taps).  BOSU step ups/ static holds for >10 sec. With min. to no UE assist.  Amb. outside on grassy terrain/ sidewalk/ uneven terrain without SPC.     Pt response for medical necessity: Pt. Benefits from skilled PT services to increase B LE muscle strength  to improve balance/ gait independence. Pt. Continues to benefit from [facility] for safety with gait (all surfaces).        PT Long Term Goals - 12/25/15 1942    PT LONG TERM GOAL #1   Title Pt. I with HEP to increase B LE muscle strength 1/2 muscle grade to improve standing/ walking.     Baseline  B LE muscle weakness: B hip flexion 4/5, B hip IR/ER 4/5, B quads, hamstring, DF 4+/5 MMT.    Time 4   Period Weeks   Status New   PT LONG TERM GOAL #2   Title Pt. will increase LEFS to >50 out of 80 to improve overall functional mobility.    Baseline LEFS: 30 out of 80 on 5/18   Time 4   Period Weeks   Status New   PT LONG TERM GOAL #3   Title Pt. will increase Berg balance test to >52/56 to decrease fall risk/ improve walking.    Baseline Berg: 48/56 on 5/18   Time 4   Period Weeks   Status New   PT LONG TERM GOAL #4   Title Pt. able to ambulate with least assistive device and normalized gait pattern to improve walking/ decrease fall risk.    Baseline L antalgic gait.  Benefits from [facility]   Time 4   Period Weeks   Status  New            Plan - 01/10/16 0942    Clinical Impression Statement Pt states she is feeling more energized over the last week. She demonstrates increased endurance during therapy session but still requires UE support for moderate difficulty balance tasks including BOSU balance. Pt able to walk on uneven surfaces with supervision experiencing no LOB but still recommend cane for independent ambulation at this time due to remaining balance deficits. PT will reassess Berg balance test next tx. session.     Rehab Potential Good   PT Frequency 2x / week   PT Duration 4 weeks   PT Treatment/Interventions ADLs/Self Care Home Management;Aquatic Therapy;Functional mobility training;Stair training;Gait training;Therapeutic activities;Therapeutic exercise;Balance training;Patient/family education;Neuromuscular re-education   PT Next Visit Plan Reassess goals/ Berg balance test    PT Home Exercise Plan See handouts.       Patient will benefit from skilled therapeutic intervention in order to improve the following deficits and impairments:  Abnormal gait, Decreased endurance, Decreased activity tolerance, Decreased strength, Difficulty walking, Decreased balance, Decreased mobility, Decreased coordination, Postural dysfunction, Improper body mechanics, Decreased range of motion  Visit Diagnosis: Difficulty in walking, not elsewhere classified  Muscle weakness (generalized)  Risk for falls     Problem List Patient Active Problem List   Diagnosis Date Noted  . CVA (cerebral infarction) 11/16/2015  . Aneurysm, cerebral, nonruptured 01/04/2015  . Essential (primary) hypertension 01/04/2015  . HLD (hyperlipidemia) 01/04/2015  . Routine general medical examination at a health care facility 01/04/2015  . Need for vaccination 01/04/2015  . Idiopathic insomnia 01/04/2015  . Cutaneous eruption 01/04/2015  . Special screening for malignant neoplasms, colon 01/04/2015   Pura Spice, PT, DPT # [phone]   01/10/2016, 1:21 PM  [facility] [address] [place], [place], [address] Phone: [phone]   Fax:  [phone]  Name: Rickiyah Greenwald MRN: [id number] Date of Birth: February 24, 1941

## 2016-01-11 ENCOUNTER — Ambulatory Visit (HOSPITAL_COMMUNITY): Admission: RE | Admit: 2016-01-11 | Payer: Medicare Other | Source: Ambulatory Visit

## 2016-01-11 ENCOUNTER — Ambulatory Visit: Payer: Medicare Other | Admitting: Internal Medicine

## 2016-01-12 ENCOUNTER — Ambulatory Visit (HOSPITAL_COMMUNITY)
Admission: RE | Admit: 2016-01-12 | Discharge: 2016-01-12 | Disposition: A | Discharge status: Home / Self Care | Payer: Medicare Other | Source: Ambulatory Visit | Attending: Neurosurgery | Admitting: Neurosurgery

## 2016-01-12 ENCOUNTER — Other Ambulatory Visit (HOSPITAL_COMMUNITY): Payer: Self-pay | Admitting: Neurosurgery

## 2016-01-12 ENCOUNTER — Ambulatory Visit: Payer: Medicare Other | Admitting: Internal Medicine

## 2016-01-12 DIAGNOSIS — I6523 Occlusion and stenosis of bilateral carotid arteries: Secondary | ICD-10-CM | POA: Diagnosis not present

## 2016-01-12 DIAGNOSIS — I671 Cerebral aneurysm, nonruptured: Secondary | ICD-10-CM

## 2016-01-12 DIAGNOSIS — Z7982 Long term (current) use of aspirin: Secondary | ICD-10-CM | POA: Insufficient documentation

## 2016-01-12 DIAGNOSIS — I10 Essential (primary) hypertension: Secondary | ICD-10-CM | POA: Diagnosis not present

## 2016-01-12 DIAGNOSIS — Z96653 Presence of artificial knee joint, bilateral: Secondary | ICD-10-CM | POA: Diagnosis not present

## 2016-01-12 DIAGNOSIS — Z8673 Personal history of transient ischemic attack (TIA), and cerebral infarction without residual deficits: Secondary | ICD-10-CM | POA: Diagnosis not present

## 2016-01-12 HISTORY — PX: IR GENERIC HISTORICAL: IMG1180011

## 2016-01-12 LAB — APTT: APTT: 28 s (ref 24–37)

## 2016-01-12 LAB — PROTIME-INR
INR: 0.99 (ref 0.00–1.49)
Prothrombin Time: 13.3 seconds (ref 11.6–15.2)

## 2016-01-12 LAB — BASIC METABOLIC PANEL
ANION GAP: 8 (ref 5–15)
BUN: 13 mg/dL (ref 6–20)
CALCIUM: 10.4 mg/dL — AB (ref 8.9–10.3)
CHLORIDE: 106 mmol/L (ref 101–111)
CO2: 26 mmol/L (ref 22–32)
Creatinine, Ser: 0.71 mg/dL (ref 0.44–1.00)
GFR calc non Af Amer: >60 mL/min (ref >60)
GLUCOSE: 125 mg/dL — AB (ref 65–99)
POTASSIUM: 3.8 mmol/L (ref 3.5–5.1)
Sodium: 140 mmol/L (ref 135–145)

## 2016-01-12 LAB — CBC
HEMATOCRIT: 41.7 % (ref 36.0–46.0)
HEMOGLOBIN: 13.6 g/dL (ref 12.0–15.0)
MCH: 30.6 pg (ref 26.0–34.0)
MCHC: 32.6 g/dL (ref 30.0–36.0)
MCV: 93.9 fL (ref 78.0–100.0)
Platelets: 255 10*3/uL (ref 150–400)
RBC: 4.44 MIL/uL (ref 3.87–5.11)
RDW: 13.5 % (ref 11.5–15.5)
WBC: 6.2 10*3/uL (ref 4.0–10.5)

## 2016-01-12 MED ORDER — MIDAZOLAM HCL 2 MG/2ML IJ SOLN
INTRAMUSCULAR | Status: AC
  Filled 2016-01-12: qty 2

## 2016-01-12 MED ORDER — FENTANYL CITRATE (PF) 100 MCG/2ML IJ SOLN
INTRAMUSCULAR | Status: AC | PRN
  Administered 2016-01-12 (×2): 25 ug via INTRAVENOUS

## 2016-01-12 MED ORDER — IOPAMIDOL (ISOVUE-300) INJECTION 61%
INTRAVENOUS | Status: AC
  Filled 2016-01-12: qty 100

## 2016-01-12 MED ORDER — IOPAMIDOL (ISOVUE-300) INJECTION 61%
INTRAVENOUS | Status: AC
  Administered 2016-01-12: 75 mL
  Filled 2016-01-12: qty 150

## 2016-01-12 MED ORDER — LIDOCAINE HCL 1 % IJ SOLN
INTRAMUSCULAR | Status: AC
  Administered 2016-01-12: 10 mL
  Filled 2016-01-12: qty 20

## 2016-01-12 MED ORDER — HEPARIN SODIUM (PORCINE) 1000 UNIT/ML IJ SOLN
INTRAMUSCULAR | Status: AC | PRN
  Administered 2016-01-12: 2000 [IU] via INTRAVENOUS

## 2016-01-12 MED ORDER — HEPARIN SODIUM (PORCINE) 1000 UNIT/ML IJ SOLN
INTRAMUSCULAR | Status: AC
  Filled 2016-01-12: qty 2

## 2016-01-12 MED ORDER — MIDAZOLAM HCL 2 MG/2ML IJ SOLN
INTRAMUSCULAR | Status: AC | PRN
  Administered 2016-01-12: 1 mg via INTRAVENOUS

## 2016-01-12 MED ORDER — FENTANYL CITRATE (PF) 100 MCG/2ML IJ SOLN
INTRAMUSCULAR | Status: AC
  Filled 2016-01-12: qty 2

## 2016-01-12 MED ORDER — SODIUM CHLORIDE 0.9 % IV SOLN: INTRAVENOUS | Status: DC

## 2016-01-12 MED ORDER — HYDROCODONE-ACETAMINOPHEN 5-325 MG PO TABS: 1.0000 | ORAL_TABLET | ORAL | Status: DC | PRN

## 2016-01-12 NOTE — Sedation Documentation (Signed)
Patient is resting comfortably. 

## 2016-01-12 NOTE — Sedation Documentation (Signed)
Patient denies pain and is resting comfortably.  

## 2016-01-12 NOTE — H&P (Signed)
CC:  Aneurysm  HPI: Bonnie Jackson is a 75 y.o. female with a previous right thalamic stroke with good recovery. 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 Workup during evaluation for the recent stroke again demonstrated the aneurysm, measuring about 62mm. She therefore presents for further w/u with diagnostic cerebral angiogram.  PMH: Past Medical History  Diagnosis Date  . Hypertension   . Brain aneurysm     PSH: Past Surgical History  Procedure Laterality Date  . Abdominal hernia repair    . Total abdominal hysterectomy    . Cholecystectomy    . Replacement total knee Bilateral     SH: Social History  Substance Use Topics  . Smoking status: Never Smoker   . Smokeless tobacco: Not on file  . Alcohol Use: No     Comment: occasional    MEDS: Prior to Admission medications   Medication Sig Start Date End Date Taking? Authorizing Provider  aspirin 325 MG tablet Take 1 tablet (325 mg total) by mouth daily. 11/17/15  Yes Dustin Flock, MD  atorvastatin (LIPITOR) 40 MG tablet Take 0.5 tablets (20 mg total) by mouth daily at 6 PM. 11/17/15  Yes Dustin Flock, MD  lisinopril-hydrochlorothiazide (PRINZIDE,ZESTORETIC) 10-12.5 MG tablet Take 1 tablet by mouth daily. 12/18/15  Yes Historical Provider, MD  traZODone (DESYREL) 50 MG tablet Take 50 mg by mouth at bedtime.  06/04/15  Yes Historical Provider, MD    ALLERGY: No Known Allergies  ROS: ROS  NEUROLOGIC EXAM: Awake, alert, oriented Memory and concentration grossly intact Speech fluent, appropriate CN grossly intact Motor exam: Upper Extremities Deltoid Bicep Tricep Grip  Right 5/5 5/5 5/5 5/5  Left 5/5 5/5 5/5 5/5   Lower Extremity IP Quad PF DF EHL  Right 5/5 5/5 5/5 5/5 5/5  Left 5/5 5/5 5/5 5/5 5/5   Sensation grossly intact to LT  IMGAING: ~41mm basilar apex aneurysm seen on MRA  IMPRESSION: - 75 y.o. 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 Hx of small thalamic stroke with good recovery  PLAN: - Proceed with diagnostic cerebral angiogram - Likely home post procedure  I have reviewed the risks, benefits and alternatives to diagnostic angiogram with the patient and her neighbor (friend). All their questions were answered.

## 2016-01-12 NOTE — Discharge Instructions (Signed)
Angiogram, Care After °Refer to this sheet in the next few weeks. These instructions provide you with information about caring for yourself after your procedure. Your health care provider may also give you more specific instructions. Your treatment has been planned according to current medical practices, but problems sometimes occur. Call your health care provider if you have any problems or questions after your procedure. °WHAT TO EXPECT AFTER THE PROCEDURE °After your procedure, it is typical to have the following: °· Bruising at the catheter insertion site that usually fades within 1-2 weeks. °· Blood collecting in the tissue (hematoma) that may be painful to the touch. It should usually decrease in size and tenderness within 1-2 weeks. °HOME CARE INSTRUCTIONS °· Take medicines only as directed by your health care provider. °· You may shower 24-48 hours after the procedure or as directed by your health care provider. Remove the bandage (dressing) and gently wash the site with plain soap and water. Pat the area dry with a clean towel. Do not rub the site, because this may cause bleeding. °· Do not take baths, swim, or use a hot tub until your health care provider approves. °· Check your insertion site every day for redness, swelling, or drainage. °· Do not apply powder or lotion to the site. °· Do not lift over 10 lb (4.5 kg) for 5 days after your procedure or as directed by your health care provider. °· Ask your health care provider when it is okay to: °¨ Return to work or school. °¨ Resume usual physical activities or sports. °¨ Resume sexual activity. °· Do not drive home if you are discharged the same day as the procedure. Have someone else drive you. °· You may drive 24 hours after the procedure unless otherwise instructed by your health care provider. °· Do not operate machinery or power tools for 24 hours after the procedure or as directed by your health care provider. °· If your procedure was done as an  outpatient procedure, which means that you went home the same day as your procedure, a responsible adult should be with you for the first 24 hours after you arrive home. °· Keep all follow-up visits as directed by your health care provider. This is important. °SEEK MEDICAL CARE IF: °· You have a fever. °· You have chills. °· You have increased bleeding from the catheter insertion site. Hold pressure on the site. °SEEK IMMEDIATE MEDICAL CARE IF: °· You have unusual pain at the catheter insertion site. °· You have redness, warmth, or swelling at the catheter insertion site. °· You have drainage (other than a small amount of blood on the dressing) from the catheter insertion site. °· The catheter insertion site is bleeding, and the bleeding does not stop after 30 minutes of holding steady pressure on the site. °· The area near or just beyond the catheter insertion site becomes pale, cool, tingly, or numb. °  °This information is not intended to replace advice given to you by your health care provider. Make sure you discuss any questions you have with your health care provider. °  °Document Released: 02/07/2005 Document Revised: 08/12/2014 Document Reviewed: 12/23/2012 °Elsevier Interactive Patient Education ©2016 [employer] ° °

## 2016-01-15 ENCOUNTER — Ambulatory Visit: Payer: Medicare Other | Admitting: Physical Therapy

## 2016-01-15 ENCOUNTER — Other Ambulatory Visit: Payer: Self-pay | Admitting: Internal Medicine

## 2016-01-16 ENCOUNTER — Other Ambulatory Visit: Payer: Self-pay | Admitting: Internal Medicine

## 2016-01-17 ENCOUNTER — Ambulatory Visit: Payer: Medicare Other | Admitting: Physical Therapy

## 2016-01-17 DIAGNOSIS — R262 Difficulty in walking, not elsewhere classified: Secondary | ICD-10-CM | POA: Diagnosis not present

## 2016-01-17 DIAGNOSIS — Z9181 History of falling: Secondary | ICD-10-CM

## 2016-01-17 DIAGNOSIS — M6281 Muscle weakness (generalized): Secondary | ICD-10-CM

## 2016-01-17 NOTE — Therapy (Signed)
[facility] [address] [place], [place], [address] Phone: [phone]   Fax:  [phone]  Physical Therapy Treatment  Patient Details  Name: Bonnie Jackson MRN: [id number] Date of Birth: [birth year] Referring Provider: Dr. Margette Fast  Encounter Date: 01/17/2016      PT End of Session - 01/17/16 0935    Visit Number 8   Number of Visits 8   Date for PT Re-Evaluation 01/18/16   Authorization - Visit Number 8   Authorization - Number of Visits 10   PT Start Time 3832   PT Stop Time 0951   PT Time Calculation (min) 59 min   Activity Tolerance Patient tolerated treatment well   Behavior During Therapy [facility] for tasks assessed/performed      Past Medical History  Diagnosis Date  . Hypertension   . Brain aneurysm     Past Surgical History  Procedure Laterality Date  . Abdominal hernia repair    . Total abdominal hysterectomy    . Cholecystectomy    . Replacement total knee Bilateral     There were no vitals filed for this visit.      Subjective Assessment - 01/17/16 0929    Subjective Pt. states her Angiogram went well and no issues noted.  Pt. states she is doing well with no LOB or falls.     Limitations Walking;House hold activities;Standing   Patient Stated Goals Improve walking/ balance   Currently in Pain? No/denies      OBJECTIVE: There.ex.: Scifit L7.5 B LE only 10 min. (warm-up/nocharge).  Walking around PT clinic with reassessment of step pattern/arm swing/ posture. Step ups/ downs with no UE assist on R/L 10x2 each (R quad/hip control).  Eccentric step downs on 3" with assist for heel raises. Discussed Silver Sneakers ex. program. Neuro: Airex (NBOS/ tandem stance). Berg balance reassessment 51/56 (marked improvement).      Pt response for medical necessity: Pt. Benefits from skilled PT services to increase B LE muscle strength to improve balance/ gait independence. Pt. Continues to  benefit from [facility] for safety with gait (outside surfaces).        PT Education - 01/17/16 0934    Education provided Yes   Education Details Reviewed HEP in depth/ progression to Silver Sneakers and daily walking program.     Person(s) Educated Patient   Methods Explanation;Demonstration   Comprehension Verbalized understanding;Returned demonstration            PT Long Term Goals - 01/17/16 0920    PT LONG TERM GOAL #1   Title Pt. I with HEP to increase B LE muscle strength 1/2 muscle grade to improve standing/ walking.     Baseline  B LE muscle weakness: B hip flexion 4+/5, B hip IR/ER 4+/5, R quad. 5/5, L quad 4+/5, R hamstring 5/5, L hamstring 4/5, DF 5/5 MMT.    Time 4   Period Weeks   Status Achieved   PT LONG TERM GOAL #2   Title Pt. will increase LEFS to >50 out of 80 to improve overall functional mobility.    Baseline 44 out of 80   Time 4   Period Weeks   Status Partially Met   PT LONG TERM GOAL #3   Title Pt. will increase Berg balance test to >52/56 to decrease fall risk/ improve walking.    Baseline 51/56   Time 4   Period Weeks   Status Partially Met   PT LONG TERM GOAL #  4   Title Pt. able to ambulate with least assistive device and normalized gait pattern to improve walking/ decrease fall risk.    Time 4   Period Weeks   Status Achieved            Plan - 07-Feb-2016 1027    Clinical Impression Statement Pt. has progressed well with all aspects of skilled PT services and will benefit from discharge from PT at this time with focus on gym based (Silver Sneakers)/ HEP.  Pt. will continue to progress off of SPC with daily outside walking and use SPC if she feels tired/ weak.  Marked improvement in overall energy levels and LE muscle strength.  Discharge from PT at this time.  Pt. instructed to contact PT if any issue.     Rehab Potential Good   PT Frequency 2x / week   PT Duration 4 weeks   PT Treatment/Interventions ADLs/Self Care Home Management;Aquatic  Therapy;Functional mobility training;Stair training;Gait training;Therapeutic activities;Therapeutic exercise;Balance training;Patient/family education;Neuromuscular re-education   PT Next Visit Plan Discharge   PT Home Exercise Plan See handouts.    Recommended Other Services Silver Sneakers   Consulted and Agree with Plan of Care Patient      Patient will benefit from skilled therapeutic intervention in order to improve the following deficits and impairments:  Abnormal gait, Decreased endurance, Decreased activity tolerance, Decreased strength, Difficulty walking, Decreased balance, Decreased mobility, Decreased coordination, Postural dysfunction, Improper body mechanics, Decreased range of motion  Visit Diagnosis: Difficulty in walking, not elsewhere classified  Muscle weakness (generalized)  Risk for falls       G-Codes - Feb 07, 2016 0928    Functional Assessment Tool Used Berg/ LEFS/ clinical impression/ gait difficulty   Functional Limitation Mobility: Walking and moving around   Mobility: Walking and Moving Around Current Status [phone]) At least 1 percent but less than 20 percent impaired, limited or restricted   Mobility: Walking and Moving Around Goal Status [phone]) At least 1 percent but less than 20 percent impaired, limited or restricted   Mobility: Walking and Moving Around Discharge Status [phone]) At least 1 percent but less than 20 percent impaired, limited or restricted      Problem List Patient Active Problem List   Diagnosis Date Noted  . CVA (cerebral infarction) 11/16/2015  . Aneurysm, cerebral, nonruptured 01/04/2015  . Essential (primary) hypertension 01/04/2015  . HLD (hyperlipidemia) 01/04/2015  . Routine general medical examination at a health care facility 01/04/2015  . Need for vaccination 01/04/2015  . Idiopathic insomnia 01/04/2015  . Cutaneous eruption 01/04/2015  . Special screening for malignant neoplasms, colon 01/04/2015   Pura Spice, PT,  DPT # [phone]   02-07-16, 10:47 AM  [place] [facility] [address] [place], [place], [address] Phone: [phone]   Fax:  [phone]  Name: Bonnie Jackson MRN: [id number] Date of Birth: 03/05/1941

## 2016-01-18 ENCOUNTER — Ambulatory Visit: Payer: Medicare Other | Admitting: Internal Medicine

## 2016-01-19 ENCOUNTER — Ambulatory Visit (HOSPITAL_COMMUNITY): Payer: Medicare Other

## 2016-01-19 ENCOUNTER — Encounter: Payer: Self-pay | Admitting: Internal Medicine

## 2016-01-19 ENCOUNTER — Ambulatory Visit (INDEPENDENT_AMBULATORY_CARE_PROVIDER_SITE_OTHER): Payer: Medicare Other | Admitting: Internal Medicine

## 2016-01-19 VITALS — BP 115/78 | HR 73 | Resp 16 | Ht 63.0 in | Wt 174.0 lb

## 2016-01-19 DIAGNOSIS — G4700 Insomnia, unspecified: Secondary | ICD-10-CM | POA: Diagnosis not present

## 2016-01-19 DIAGNOSIS — H02109 Unspecified ectropion of unspecified eye, unspecified eyelid: Secondary | ICD-10-CM | POA: Insufficient documentation

## 2016-01-19 DIAGNOSIS — I671 Cerebral aneurysm, nonruptured: Secondary | ICD-10-CM

## 2016-01-19 DIAGNOSIS — I6381 Other cerebral infarction due to occlusion or stenosis of small artery: Secondary | ICD-10-CM

## 2016-01-19 DIAGNOSIS — D485 Neoplasm of uncertain behavior of skin: Secondary | ICD-10-CM | POA: Diagnosis not present

## 2016-01-19 DIAGNOSIS — I639 Cerebral infarction, unspecified: Secondary | ICD-10-CM

## 2016-01-19 DIAGNOSIS — I10 Essential (primary) hypertension: Secondary | ICD-10-CM

## 2016-01-19 DIAGNOSIS — H269 Unspecified cataract: Secondary | ICD-10-CM | POA: Insufficient documentation

## 2016-01-19 DIAGNOSIS — H02103 Unspecified ectropion of right eye, unspecified eyelid: Secondary | ICD-10-CM

## 2016-01-19 MED ORDER — TRAZODONE HCL 50 MG PO TABS: 50.0000 mg | ORAL_TABLET | Freq: Every day | ORAL | Status: DC

## 2016-01-19 MED ORDER — LISINOPRIL-HYDROCHLOROTHIAZIDE 10-12.5 MG PO TABS: 1.0000 | ORAL_TABLET | Freq: Every day | ORAL | Status: DC

## 2016-01-19 MED ORDER — ATORVASTATIN CALCIUM 20 MG PO TABS: 20.0000 mg | ORAL_TABLET | Freq: Every day | ORAL | Status: DC

## 2016-01-19 NOTE — Progress Notes (Signed)
Date:  01/19/2016   Name:  Bonnie Jackson   DOB:  1940-09-18   MRN:  [id number]   Chief Complaint: Hypertension Hypertension This is a chronic problem. The current episode started more than 1 year ago. The problem has been waxing and waning since onset. The problem is controlled. Associated symptoms include headaches. Pertinent negatives include no chest pain, palpitations or shortness of breath. Hypertensive end-organ damage includes CVA.  Insomnia Primary symptoms: fragmented sleep, sleep disturbance, frequent awakening.  The current episode started more than one month. The onset quality is sudden. The problem is unchanged. Past treatments include medication. The treatment provided mild relief. Typical bedtime:  10-11 P.M..  How long after going to bed to you fall asleep: 15-30 minutes.     CVA - thalamic stroke in April 2017.  Seen by Neurosurgery for small basilar tip aneurysm.  Undergoing PT for gait instability - just finished yesterday.  She does not need surgery - will have annual follow up of the aneuysm.  Cataract - pt has a right cataract that needs surgery.  She asks when it is safe to schedule.  She had her stroke in April 2017 and is on full dose aspirin daily.  She also has increased tearing from her right eye for uncertain reasons.  Skin lesion - warty lesion on right cheek is new.  Not painful or itching.     Review of Systems  Constitutional: Negative for fever, chills, fatigue and unexpected weight change.  HENT: Negative for ear pain.   Eyes: Positive for redness and visual disturbance.  Respiratory: Negative for cough, chest tightness and shortness of breath.   Cardiovascular: Negative for chest pain, palpitations and leg swelling.  Gastrointestinal: Negative for abdominal pain.  Genitourinary: Positive for dysuria and difficulty urinating.  Musculoskeletal: Positive for gait problem (balance).  Skin: Negative for color change and rash.  Neurological: Positive for  headaches. Negative for dizziness, seizures and syncope.  Hematological: Negative for adenopathy.  Psychiatric/Behavioral: Positive for confusion (feels like her memory is less sharp since her stroke) and sleep disturbance. Negative for dysphoric mood. The patient has insomnia. The patient is not nervous/anxious.     Patient Active Problem List   Diagnosis Date Noted  . Thalamic infarction ([place]) 11/16/2015  . Aneurysm, cerebral, nonruptured 01/04/2015  . Essential (primary) hypertension 01/04/2015  . HLD (hyperlipidemia) 01/04/2015  . Routine general medical examination at a health care facility 01/04/2015  . Need for vaccination 01/04/2015  . Idiopathic insomnia 01/04/2015  . Cutaneous eruption 01/04/2015  . Special screening for malignant neoplasms, colon 01/04/2015    Prior to Admission medications   Medication Sig Start Date End Date Taking? Authorizing Provider  aspirin 325 MG tablet Take 1 tablet (325 mg total) by mouth daily. 11/17/15  Yes Dustin Flock, MD  atorvastatin (LIPITOR) 40 MG tablet Take 0.5 tablets (20 mg total) by mouth daily at 6 PM. 11/17/15  Yes Dustin Flock, MD  lisinopril-hydrochlorothiazide (PRINZIDE,ZESTORETIC) 10-12.5 MG tablet TAKE 1 TABLET BY MOUTH EVERY DAY 01/16/16  Yes Glean Hess, MD  traZODone (DESYREL) 50 MG tablet TAKE 1 TABLET BY MOUTH EVERY NIGHT AT BEDTIME 01/15/16  Yes Glean Hess, MD    No Known Allergies  Past Surgical History  Procedure Laterality Date  . Abdominal hernia repair    . Total abdominal hysterectomy    . Cholecystectomy    . Replacement total knee Bilateral     Social History  Substance Use Topics  . Smoking status:  Never Smoker   . Smokeless tobacco: None  . Alcohol Use: No     Comment: occasional     Medication list has been reviewed and updated.   Physical Exam  Constitutional: She is oriented to person, place, and time. She appears well-developed. No distress.  HENT:  Head: Normocephalic and  atraumatic.  Eyes:    Neck: Normal range of motion. Neck supple. Carotid bruit is not present. No thyromegaly present.  Cardiovascular: Normal rate, regular rhythm and normal heart sounds.   Pulmonary/Chest: Effort normal and breath sounds normal. No respiratory distress.  Musculoskeletal: Normal range of motion.  Neurological: She is alert and oriented to person, place, and time.  Skin: Skin is warm and dry. No rash noted.     Psychiatric: She has a normal mood and affect. Her behavior is normal. Thought content normal.  Nursing note and vitals reviewed.   BP 115/78 mmHg  Pulse 73  Resp 16  Ht 5\' 3"  (1.6 m)  Wt 174 lb (78.926 kg)  BMI 30.83 kg/m2  SpO2 97%  Assessment and Plan: 1. Essential (primary) hypertension Controlled, continue same medication - lisinopril-hydrochlorothiazide (PRINZIDE,ZESTORETIC) 10-12.5 MG tablet; Take 1 tablet by mouth daily.  Dispense: 90 tablet; Refill: 3  2. Aneurysm, cerebral, nonruptured Stable; follow up one year  3. Thalamic infarction ([place]) Continue aspirin and atorvastatin - atorvastatin (LIPITOR) 20 MG tablet; Take 1 tablet (20 mg total) by mouth daily at 6 PM.  Dispense: 90 tablet; Refill: 1  4. Insomnia Increase trazodone Sleep hygiene measures discussed - traZODone (DESYREL) 50 MG tablet; Take 1-2 tablets (50-100 mg total) by mouth at bedtime.  Dispense: 180 tablet; Refill: 1  5. Cataract of right eye Preferably wait 6 months from stroke for surgery  6. Eversion of the eyelid, right Discuss symptoms with Eye surgeon  7. Neoplasm of uncertain behavior of skin Appears benign - if increasing in size, would refer to Dermatology  Halina Maidens, MD [facility]  01/19/2016

## 2016-01-22 ENCOUNTER — Ambulatory Visit: Payer: Medicare Other | Admitting: Physical Therapy

## 2016-01-24 ENCOUNTER — Ambulatory Visit: Payer: Medicare Other | Admitting: Physical Therapy

## 2016-02-09 ENCOUNTER — Other Ambulatory Visit: Payer: Self-pay | Admitting: Internal Medicine

## 2016-02-15 NOTE — Op Note (Signed)
DIAGNOSTIC CEREBRAL ANGIOGRAM    OPERATOR:   Dr. Consuella Lose, MD  HISTORY:   The patient is a 75 y.o. yo female with a history of thalamic stroke, and previous MRA several years ago demonstrating a basilar apex aneurysm. More recent MRI during the stroke demonstrated possible enlargement. She therefore presents for further workup with diagnostic angiogram.  APPROACH:   The technical aspects of the procedure as well as its potential risks and benefits were reviewed with the patient. These risks included but were not limited bleeding, infection, allergic reaction, damage to organs/vital structures, stroke, non-diagnostic procedure, and the catastrophic outcomes of heart attack, coma, and death. With an understanding of these risks, informed consent was obtained and witnessed.    The patient was placed in the supine position on the angiography table and the skin of right groin prepped in the usual sterile fashion. The procedure was performed under local anesthesia (1%-solution of bicarbonate-bufferred Lidoacaine) and conscious sedation with Versed and fentanyl monitored by the in-suite nurse.    A 5- French sheath was introduced in the right common femoral artery using Seldinger technique.  A fluorophase sequence was used to document the sheath position.    HEPARIN: 2000 Units total.   CONTRAST AGENT: 75cc, Omnipaque 300   FLUOROSCOPY TIME: 3.8 combined AP and lateral minutes    CATHETER(S) AND WIRE(S):    5-French JB-1 glidecatheter   0.035" glidewire    VESSELS CATHETERIZED:   Right common carotid   Left common carotid   Right vertebral   Left vertebral   Right common femoral  VESSELS STUDIED:   Right common carotid, neck Right common carotid, head Right vertebral Left common carotid, neck Left common carotid, head Left vertebral Left vertebral: 3D rotation Right femoral  PROCEDURAL NARRATIVE:   A 5-Fr JB-1 terumo glide catheter was advanced over a 0.035 glidewire into  the aortic arch. The above vessels were then sequentially catheterized and cervical/cerebral angiograms taken. After review of images, the catheter was removed without incident.    INTERPRETATION:   Right common carotid: neck:   There is smooth stenosis of the proximal right ICA, measuring <50%. There is no flow limitation.  Right common carotid: head:   Injection reveals the presence of a widely patent ICA, M1, and A1 segments and their branches. There is a fetal-type right PCA. There is no significant stenosis, occlusion, aneurysm or high flow vascular malformation visualized.  The parenchymal and venous phases are normal. The venous sinuses are widely patent.    Left common carotid: neck:   There is atherosclerotic stenosis at the origin of the left ICA measuring <50% without flow limitation.    Left common carotid: head:   Injection reveals the presence of a widely patent ICA, A1, and M1 segments and their branches. There is no significant stenosis, occlusion, aneurysm, or high flow vascular malformation visualized. The parenchymal and venous phases are normal. The venous sinuses are widely patent.    Right vertebral:   Injection reveals the presence of a widely patent vertebral artery. This leads to a widely patent basilar artery that terminates in bilateral P1. There are bilateral AICA-PICA complexes. There is a wide-based aneurysm at the basilar apex on the right, projecting superiorly. The right P1 is hypoplastic and not visualized. The aneurysm appears to incorporate the right SCA. The parenchymal and venous phases are normal. The venous sinuses are widely patent.    Left vertebral:    Normal vessel. No PICA aneurysm. See basilar description above.  Left vertebral, 3D rotational angiogram:   Using a separate and distinct workstation at a distinct time from any previous procedures, CT angiographic images were reviewed. This included multiplanar and 3D reformatted images. These further  delineate the above described basilar apex aneurysm. This measures 3.80mm in height and 3.18mm at the neck.  Right femoral:    Normal vessel. No significant atherosclerotic disease. Arterial sheath in adequate position.   DISPOSITION:  Upon completion of the study, the femoral sheath was removed and hemostasis obtained using a 5-Fr ExoSeal closure device. Good proximal and distal lower extremity pulses were documented upon achievement of hemostasis.    The procedure was well tolerated and no early complications were observed.       The patient was transferred back to the holding area to be positioned flat in bed for 3 hours of observation.    IMPRESSION:  1. Approximately 3.56mm aneurysm at the right basilar apex as described above 2. <50% stenosis of bilateral cervical internal carotid arteries without flow limitation.  The preliminary results of this procedure were shared with the patient and the patient's family.

## 2016-02-26 ENCOUNTER — Other Ambulatory Visit: Payer: Self-pay | Admitting: Internal Medicine

## 2016-02-26 DIAGNOSIS — I10 Essential (primary) hypertension: Secondary | ICD-10-CM

## 2016-02-26 MED ORDER — LISINOPRIL-HYDROCHLOROTHIAZIDE 10-12.5 MG PO TABS: 1.0000 | ORAL_TABLET | Freq: Every day | ORAL | 1 refills | Status: DC

## 2016-02-27 ENCOUNTER — Emergency Department: Payer: Medicare Other

## 2016-02-27 ENCOUNTER — Encounter: Payer: Self-pay | Admitting: Emergency Medicine

## 2016-02-27 ENCOUNTER — Emergency Department
Admission: EM | Admit: 2016-02-27 | Discharge: 2016-02-27 | Disposition: A | Discharge status: Home / Self Care | Payer: Medicare Other | Attending: Emergency Medicine | Admitting: Emergency Medicine

## 2016-02-27 DIAGNOSIS — R42 Dizziness and giddiness: Secondary | ICD-10-CM | POA: Insufficient documentation

## 2016-02-27 DIAGNOSIS — Z7982 Long term (current) use of aspirin: Secondary | ICD-10-CM | POA: Insufficient documentation

## 2016-02-27 DIAGNOSIS — I10 Essential (primary) hypertension: Secondary | ICD-10-CM | POA: Diagnosis not present

## 2016-02-27 HISTORY — DX: Cerebral infarction, unspecified: I63.9

## 2016-02-27 LAB — CBC
HEMATOCRIT: 45.7 % (ref 35.0–47.0)
HEMOGLOBIN: 15.3 g/dL (ref 12.0–16.0)
MCH: 32.4 pg (ref 26.0–34.0)
MCHC: 33.5 g/dL (ref 32.0–36.0)
MCV: 96.7 fL (ref 80.0–100.0)
Platelets: 293 10*3/uL (ref 150–440)
RBC: 4.73 MIL/uL (ref 3.80–5.20)
RDW: 14 % (ref 11.5–14.5)
WBC: 7.3 10*3/uL (ref 3.6–11.0)

## 2016-02-27 LAB — COMPREHENSIVE METABOLIC PANEL
ALBUMIN: 4.6 g/dL (ref 3.5–5.0)
ALK PHOS: 57 U/L (ref 38–126)
ALT: 28 U/L (ref 14–54)
AST: 28 U/L (ref 15–41)
Anion gap: 9 (ref 5–15)
BILIRUBIN TOTAL: 0.7 mg/dL (ref 0.3–1.2)
BUN: 18 mg/dL (ref 6–20)
CALCIUM: 10.2 mg/dL (ref 8.9–10.3)
CO2: 25 mmol/L (ref 22–32)
CREATININE: 0.58 mg/dL (ref 0.44–1.00)
Chloride: 103 mmol/L (ref 101–111)
GFR calc Af Amer: >60 mL/min (ref >60)
GFR calc non Af Amer: >60 mL/min (ref >60)
GLUCOSE: 153 mg/dL — AB (ref 65–99)
Potassium: 3.8 mmol/L (ref 3.5–5.1)
Sodium: 137 mmol/L (ref 135–145)
TOTAL PROTEIN: 7.8 g/dL (ref 6.5–8.1)

## 2016-02-27 LAB — URINALYSIS COMPLETE WITH MICROSCOPIC (ARMC ONLY)
BACTERIA UA: NONE SEEN
BILIRUBIN URINE: NEGATIVE
GLUCOSE, UA: NEGATIVE mg/dL
Hgb urine dipstick: NEGATIVE
Ketones, ur: NEGATIVE mg/dL
Leukocytes, UA: NEGATIVE
Nitrite: NEGATIVE
Protein, ur: NEGATIVE mg/dL
RBC / HPF: NONE SEEN RBC/hpf (ref 0–5)
SQUAMOUS EPITHELIAL / LPF: NONE SEEN
Specific Gravity, Urine: 1.002 — ABNORMAL LOW (ref 1.005–1.030)
pH: 6 (ref 5.0–8.0)

## 2016-02-27 LAB — TROPONIN I: Troponin I: <0.03 ng/mL (ref <0.03)

## 2016-02-27 NOTE — ED Provider Notes (Signed)
[facility] Emergency Department Provider Note  Time seen: 10:19 AM  I have reviewed the triage vital signs and the nursing notes.   HISTORY  Chief Complaint Weakness and Dizziness    HPI Bonnie Jackson is a 75 y.o. female with a past medical history of cerebral aneurysm, hypertension, hyperlipidemia, CVA in April, who presents the emergency department dizziness.According to the patient for the past 3 days she has been feeling intermittently dizzy/lightheaded. Denies any focal weakness or numbness. Denies headache. Patient states this morning the dizziness was much worse so she checked her blood pressure and it was 200/100. Patient states a history of a CVA in the past in which she experienced dizziness so she ever left arm symptoms. Denies any arm symptoms today but she is feeling dizzy. Denies any chest pain or shortness of breath. Denies any fever. Denies any ear pain.     Past Medical History:  Diagnosis Date  . Brain aneurysm   . CVA (cerebral infarction)   . Hypertension     Patient Active Problem List   Diagnosis Date Noted  . Insomnia 01/19/2016  . Cataract of right eye 01/19/2016  . Eversion of the eyelid 01/19/2016  . Neoplasm of uncertain behavior of skin 01/19/2016  . Thalamic infarction ([place]) 11/16/2015  . Aneurysm, cerebral, nonruptured 01/04/2015  . Essential (primary) hypertension 01/04/2015  . HLD (hyperlipidemia) 01/04/2015  . Routine general medical examination at a health care facility 01/04/2015  . Need for vaccination 01/04/2015  . Idiopathic insomnia 01/04/2015  . Cutaneous eruption 01/04/2015  . Special screening for malignant neoplasms, colon 01/04/2015    Past Surgical History:  Procedure Laterality Date  . ABDOMINAL HERNIA REPAIR    . CHOLECYSTECTOMY    . REPLACEMENT TOTAL KNEE Bilateral   . TOTAL ABDOMINAL HYSTERECTOMY      Current Outpatient Rx  . Order #: [id number] Class: OTC  . Order #: [id number] Class: Normal  .  Order #: [id number] Class: Normal  . Order #: [id number] Class: Normal  . Order #: [id number] Class: Normal    Allergies Review of patient's allergies indicates no known allergies.  Family History  Problem Relation Age of Onset  . Breast cancer Mother 46  . Heart attack Father   . Dementia Sister   . Cancer Brother     lung  . Cancer Sister     pancreatic  . Cancer Brother     lung  . Cancer Daughter   . Breast cancer Daughter 59    Social History Social History  Substance Use Topics  . Smoking status: Never Smoker  . Smokeless tobacco: Never Used  . Alcohol use No     Comment: occasional    Review of Systems Constitutional: Negative for fever. Positive for dizziness. Cardiovascular: Negative for chest pain. Respiratory: Negative for shortness of breath. Gastrointestinal: Negative for abdominal pain Musculoskeletal: Negative for back pain. Neurological: Negative for headache 10-point ROS otherwise negative.  ____________________________________________   PHYSICAL EXAM:  VITAL SIGNS: ED Triage Vitals  Enc Vitals Group     BP 02/27/16 0953 (!) 190/90     Pulse Rate 02/27/16 0953 77     Resp 02/27/16 0953 18     Temp 02/27/16 0953 97 F (36.1 C)     Temp Source 02/27/16 0953 Oral     SpO2 02/27/16 0953 99 %     Weight 02/27/16 0953 175 lb (79.4 kg)     Height 02/27/16 0953 5\' 3"  (1.6 m)     Head Circumference --  Peak Flow --      Pain Score 02/27/16 0954 0     Pain Loc --      Pain Edu? --      Excl. in [place]? --     Constitutional: Alert and oriented. Well appearing and in no distress. Eyes: Normal exam ENT   Head: Normocephalic and atraumatic.Normal tympanic membranes.   Mouth/Throat: Mucous membranes are moist. Cardiovascular: Normal rate, regular rhythm. No murmur Respiratory: Normal respiratory effort without tachypnea nor retractions. Breath sounds are clear Gastrointestinal: Soft and nontender. No distention.  Musculoskeletal: Nontender  with normal range of motion in all extremities. Neurologic:  Normal speech and language. No gross focal neurologic deficits. Equal grip strengths. Cranial nerves intact. No pronator drift. Skin:  Skin is warm, dry and intact.  Psychiatric: Mood and affect are normal.   ____________________________________________    EKG  EKG reviewed and interpreted by myself shows sinus rhythm at 70 bpm, narrow QRS, normal axis, normal intervals, nonspecific ST changes without ST elevation.  INITIAL IMPRESSION / ASSESSMENT AND PLAN / ED COURSE  Pertinent labs & imaging results that were available during my care of the patient were reviewed by me and considered in my medical decision making (see chart for details).  Patient presents the emergency department elevated blood pressure, dizziness, with a history of CVA in the past. We will check labs, MRI of the brain, and closer monitoring the emergency department, currently the patient's blood pressure is 160/90  Patient's labs are within normal limits. Troponin negative, MRI is normal. We'll discharge the patient home with PCP follow-up.  ____________________________________________   FINAL CLINICAL IMPRESSION(S) / ED DIAGNOSES  Dizziness    Harvest Dark, MD 02/27/16 1230

## 2016-02-27 NOTE — ED Triage Notes (Signed)
Pt to ed via ems from home after having dizziness and weakness that started this am after she got up.  Pt states she had similar episodes for the past 3 days but they were not as severe.  Pt alert and oriented on arrival. Pt in no acute distress.  Assisted pt to bathroom without incident.  Pt also reports high blood pressure this am.

## 2016-02-28 ENCOUNTER — Encounter: Payer: Self-pay | Admitting: Internal Medicine

## 2016-02-28 ENCOUNTER — Ambulatory Visit (INDEPENDENT_AMBULATORY_CARE_PROVIDER_SITE_OTHER): Payer: Medicare Other | Admitting: Internal Medicine

## 2016-02-28 VITALS — BP 145/82 | HR 79 | Resp 16 | Ht 63.0 in | Wt 175.0 lb

## 2016-02-28 DIAGNOSIS — I10 Essential (primary) hypertension: Secondary | ICD-10-CM

## 2016-02-28 DIAGNOSIS — G4700 Insomnia, unspecified: Secondary | ICD-10-CM

## 2016-02-28 DIAGNOSIS — H8390 Unspecified disease of inner ear, unspecified ear: Secondary | ICD-10-CM

## 2016-02-28 DIAGNOSIS — I639 Cerebral infarction, unspecified: Secondary | ICD-10-CM

## 2016-02-28 DIAGNOSIS — I6381 Other cerebral infarction due to occlusion or stenosis of small artery: Secondary | ICD-10-CM

## 2016-02-28 NOTE — Patient Instructions (Signed)
Labyrinthitis Labyrinthitis is an infection of the inner ear. Your inner ear is a system of tubes and canals (labyrinth). These are filled with fluid. Nerve cells in your inner ear send signals for hearing and balance to your brain. When tiny germs get inside the tubes and canals, they harm the cells that send messages to the brain. This can cause changes in hearing and balance. HOME CARE INSTRUCTIONS  Take medicines only as told by your doctor.  If you were prescribed an antibiotic medicine, finish all of it even if you start to feel better.  Rest as much as possible.  Avoid loud noises and bright lights.  Do not make sudden movements until any dizziness goes away.  Do not drive until your doctor says that you can.  Drink enough fluid to keep your pee (urine) clear or pale yellow.  Work with a physical therapist if you still feel dizzy after several weeks. A therapist can teach you exercises to help you deal with your dizziness.  Keep all follow-up visits as told by your doctor. This is important. GET HELP IF:  Your symptoms do not get better with medicines.  You do not get better after two weeks.  You have a fever. GET HELP RIGHT AWAY IF:  You are very dizzy.  You keep throwing up (vomiting) or keep feeling sick to your stomach (nauseous).  Your hearing gets a lot worse very quickly.   This information is not intended to replace advice given to you by your health care provider. Make sure you discuss any questions you have with your health care provider.   Document Released: 07/22/2005 Document Revised: 08/12/2014 Document Reviewed: 05/03/2014 Else[employer] Interactive Patient Education 2016 [employer]  

## 2016-02-28 NOTE — Progress Notes (Signed)
Date:  02/28/2016   Name:  Bonnie Jackson   DOB:  05-23-1941   MRN:  [id number]   Chief Complaint: Hypertension (Very light headed and was seen in ER yesterday when BP was elevated. No headache and no confussion or swelling of limbs. ) ER evaluation was unremarkable.  MRI of head was negative except for mild small vessel ischemic changes.  EKG and labs were normal.  She was discharged to follow up here.  MRI Brain: There is no evidence of acute infarct, intracranial hemorrhage, mass, midline shift, or extra-axial fluid collection. There is mild cerebral atrophy. Patchy T2 hyperintensities throughout the cerebral white matter bilaterally are unchanged and nonspecific but compatible with moderate chronic small vessel ischemic disease. There is a chronic lacunar infarct superiorly in the right thalamus, acute on the prior MRI. Orbits are unremarkable. Mild mucosal thickening and secretions are present in the sphenoid sinuses. The mastoid air cells are clear. Major intracranial vascular flow voids are preserved. A right C1-2 joint effusion is unchanged. IMPRESSION: 1. No acute intracranial abnormality. 2. Moderate chronic small vessel ischemic disease and chronic right thalamic infarct. Electronically Signed   By: Logan Bores M.D.   On: 02/27/2016 12:03  Hypertension  This is a chronic problem. The current episode started more than 1 year ago. The problem has been waxing and waning (at home pressures are consistently 120/80.) since onset. Pertinent negatives include no chest pain, headaches or palpitations.  Dizziness  This is a new problem. The current episode started in the past 7 days. The problem occurs intermittently. The problem has been gradually improving. Associated symptoms include fatigue. Pertinent negatives include no abdominal pain, chest pain, chills, congestion, coughing, fever, headaches, numbness, sore throat, vertigo or visual change. The symptoms are aggravated by walking  and exertion (turning the head quickly).  Insomnia  Primary symptoms: fragmented sleep, sleep disturbance, frequent awakening.  The problem occurs every several days. The problem is unchanged. The symptoms are relieved by medication. Past treatments include medication. The treatment provided mild relief. PMH includes: family stress or anxiety, work related stressors. Prior diagnostic workup includes:  No prior workup.    Review of Systems  Constitutional: Positive for fatigue. Negative for chills and fever.  HENT: Negative for congestion, facial swelling, hearing loss, sore throat, tinnitus and trouble swallowing.   Eyes: Negative for visual disturbance.  Respiratory: Negative for cough and chest tightness.   Cardiovascular: Negative for chest pain and palpitations.  Gastrointestinal: Negative for abdominal pain.  Neurological: Positive for dizziness and light-headedness. Negative for vertigo, syncope, speech difficulty, numbness and headaches.  Hematological: Negative for adenopathy.  Psychiatric/Behavioral: Positive for sleep disturbance. The patient has insomnia.     Patient Active Problem List   Diagnosis Date Noted  . Insomnia 01/19/2016  . Cataract of right eye 01/19/2016  . Eversion of the eyelid 01/19/2016  . Neoplasm of uncertain behavior of skin 01/19/2016  . Thalamic infarction ([place]) 11/16/2015  . Aneurysm, cerebral, nonruptured 01/04/2015  . Essential (primary) hypertension 01/04/2015  . HLD (hyperlipidemia) 01/04/2015  . Routine general medical examination at a health care facility 01/04/2015  . Need for vaccination 01/04/2015  . Idiopathic insomnia 01/04/2015  . Cutaneous eruption 01/04/2015  . Special screening for malignant neoplasms, colon 01/04/2015    Prior to Admission medications   Medication Sig Start Date End Date Taking? Authorizing Provider  aspirin 325 MG tablet Take 1 tablet (325 mg total) by mouth daily. 11/17/15  Yes Dustin Flock, MD  atorvastatin  (  LIPITOR) 20 MG tablet Take 1 tablet (20 mg total) by mouth daily at 6 PM. Patient taking differently: Take 20 mg by mouth every evening.  01/19/16  Yes Glean Hess, MD  lisinopril-hydrochlorothiazide (PRINZIDE,ZESTORETIC) 10-12.5 MG tablet Take 1 tablet by mouth daily. 02/26/16  Yes Glean Hess, MD  traZODone (DESYREL) 50 MG tablet Take 1-2 tablets (50-100 mg total) by mouth at bedtime. Patient taking differently: Take 50 mg by mouth at bedtime.  01/19/16  Yes Glean Hess, MD    No Known Allergies  Past Surgical History:  Procedure Laterality Date  . ABDOMINAL HERNIA REPAIR    . CHOLECYSTECTOMY    . REPLACEMENT TOTAL KNEE Bilateral   . TOTAL ABDOMINAL HYSTERECTOMY      Social History  Substance Use Topics  . Smoking status: Never Smoker  . Smokeless tobacco: Never Used  . Alcohol use No     Comment: occasional   BP Readings from Last 3 Encounters:  02/28/16 (!) 160/98  02/27/16 (!) 163/80  01/19/16 115/78     Medication list has been reviewed and updated.   Physical Exam  Constitutional: She is oriented to person, place, and time. She appears well-developed. No distress.  HENT:  Head: Normocephalic and atraumatic.  Right Ear: Tympanic membrane and ear canal normal.  Left Ear: Tympanic membrane and ear canal normal.  Nose: Right sinus exhibits no maxillary sinus tenderness and no frontal sinus tenderness. Left sinus exhibits no maxillary sinus tenderness and no frontal sinus tenderness.  Mouth/Throat: Oropharynx is clear and moist.  Eyes: Pupils are equal, round, and reactive to light. Right eye exhibits no chemosis and no discharge. Left eye exhibits no chemosis and no discharge. Right eye exhibits nystagmus. Left eye exhibits nystagmus.  Neck: Normal range of motion.  Cardiovascular: Normal rate, regular rhythm and normal heart sounds.   Pulmonary/Chest: Effort normal and breath sounds normal. No respiratory distress. She has no wheezes.  Musculoskeletal:  Normal range of motion.  Ambulates with cane for safety  Lymphadenopathy:    She has no cervical adenopathy.  Neurological: She is alert and oriented to person, place, and time. She has normal strength.  Skin: Skin is warm and dry. No rash noted.  Psychiatric: She has a normal mood and affect. Her speech is normal and behavior is normal. Thought content normal.  Nursing note and vitals reviewed.   BP (!) 160/98 (BP Location: Right Arm, Patient Position: Sitting, Cuff Size: Large)   Pulse 79   Resp 16   Ht 5\' 3"  (1.6 m)   Wt 175 lb (79.4 kg)   SpO2 98%   BMI 31.00 kg/m   Assessment and Plan: 1. Inner ear dysfunction, unspecified laterality Should decrease and resolve over the next week  2. Essential (primary) hypertension Monitor BP at home; parameters for returning to the ER are given - TSH  3. Insomnia and Fatigue Continue trazodone Consider Sleep study  4. Thalamic infarction ([place]) Continue aspirin Recommend 6 months post [facility] before surgery   Halina Maidens, MD [facility]  02/28/2016

## 2016-02-29 ENCOUNTER — Other Ambulatory Visit: Payer: Self-pay | Admitting: Internal Medicine

## 2016-02-29 DIAGNOSIS — Z1231 Encounter for screening mammogram for malignant neoplasm of breast: Secondary | ICD-10-CM

## 2016-02-29 LAB — TSH: TSH: 1.31 u[IU]/mL (ref 0.450–4.500)

## 2016-03-06 ENCOUNTER — Other Ambulatory Visit: Payer: Self-pay | Admitting: Internal Medicine

## 2016-03-06 ENCOUNTER — Ambulatory Visit
Admission: RE | Admit: 2016-03-06 | Discharge: 2016-03-06 | Disposition: A | Discharge status: Home / Self Care | Payer: Medicare Other | Source: Ambulatory Visit | Attending: Internal Medicine | Admitting: Internal Medicine

## 2016-03-06 ENCOUNTER — Telehealth: Payer: Self-pay

## 2016-03-06 ENCOUNTER — Other Ambulatory Visit: Payer: Self-pay

## 2016-03-06 DIAGNOSIS — Z1231 Encounter for screening mammogram for malignant neoplasm of breast: Secondary | ICD-10-CM | POA: Diagnosis not present

## 2016-03-06 DIAGNOSIS — R42 Dizziness and giddiness: Secondary | ICD-10-CM

## 2016-03-06 MED ORDER — MECLIZINE HCL 25 MG PO TABS: 25.0000 mg | ORAL_TABLET | Freq: Three times a day (TID) | ORAL | 0 refills | Status: DC | PRN

## 2016-03-06 NOTE — Telephone Encounter (Signed)
Patient called stating that her inner ear has not gotten better. She was not prescribed anything at last OV. Dr. Ronnald Ramp has advised to give 25 mg Meclozine 3 times day as needed for dizziness.

## 2016-03-13 NOTE — Telephone Encounter (Signed)
Unable to reach pt on phone.

## 2016-05-03 ENCOUNTER — Encounter (HOSPITAL_COMMUNITY): Payer: Self-pay | Admitting: Neurosurgery

## 2016-05-21 DIAGNOSIS — H40003 Preglaucoma, unspecified, bilateral: Secondary | ICD-10-CM | POA: Diagnosis not present

## 2016-05-22 ENCOUNTER — Encounter: Payer: Self-pay | Admitting: Internal Medicine

## 2016-05-22 ENCOUNTER — Ambulatory Visit (INDEPENDENT_AMBULATORY_CARE_PROVIDER_SITE_OTHER): Payer: Medicare Other | Admitting: Internal Medicine

## 2016-05-22 VITALS — BP 158/80 | HR 76 | Resp 16 | Ht 63.0 in | Wt 173.0 lb

## 2016-05-22 DIAGNOSIS — N3281 Overactive bladder: Secondary | ICD-10-CM | POA: Insufficient documentation

## 2016-05-22 DIAGNOSIS — Z23 Encounter for immunization: Secondary | ICD-10-CM | POA: Diagnosis not present

## 2016-05-22 DIAGNOSIS — I639 Cerebral infarction, unspecified: Secondary | ICD-10-CM | POA: Diagnosis not present

## 2016-05-22 DIAGNOSIS — I10 Essential (primary) hypertension: Secondary | ICD-10-CM

## 2016-05-22 DIAGNOSIS — F320 Major depressive disorder, single episode, mild: Secondary | ICD-10-CM

## 2016-05-22 DIAGNOSIS — G629 Polyneuropathy, unspecified: Secondary | ICD-10-CM | POA: Diagnosis not present

## 2016-05-22 DIAGNOSIS — I6381 Other cerebral infarction due to occlusion or stenosis of small artery: Secondary | ICD-10-CM

## 2016-05-22 MED ORDER — CITALOPRAM HYDROBROMIDE 10 MG PO TABS: 10.0000 mg | ORAL_TABLET | Freq: Every day | ORAL | 3 refills | Status: DC

## 2016-05-22 NOTE — Progress Notes (Signed)
Date:  05/22/2016   Name:  Bonnie Jackson   DOB:  1940-10-26   MRN:  [id number]   Chief Complaint: Depression and Numbness (in bed legs are numb x 2 mo) Depression         This is a new problem.  The onset quality is gradual.   Associated symptoms include fatigue, insomnia and decreased interest.  Associated symptoms include no headaches.     The symptoms are aggravated by family issues.  Past treatments include nothing.  Numbness - legs and feet feel tight when going to bed.  Sometimes her feet also have decreased sensation and improve with rubbing.  No hx of B12 deficiency.  No low back pain.  CVA - still has some balance issues since the stroke.  Walks with cane for distances.   Review of Systems  Constitutional: Positive for fatigue.  Eyes: Positive for visual disturbance (planning cataract surgery).  Respiratory: Negative for chest tightness, shortness of breath and wheezing.   Cardiovascular: Negative for chest pain, palpitations and leg swelling.  Gastrointestinal: Negative for abdominal pain, diarrhea and vomiting.  Genitourinary: Positive for frequency and urgency. Negative for dysuria.  Musculoskeletal: Negative for joint swelling.  Skin: Negative for rash.  Neurological: Negative for dizziness and headaches.  Hematological: Negative for adenopathy.  Psychiatric/Behavioral: Positive for depression, dysphoric mood and sleep disturbance. The patient has insomnia.     Patient Active Problem List   Diagnosis Date Noted  . Insomnia 01/19/2016  . Cataract of right eye 01/19/2016  . Eversion of the eyelid 01/19/2016  . Neoplasm of uncertain behavior of skin 01/19/2016  . Thalamic infarction ([place]) 11/16/2015  . Aneurysm, cerebral, nonruptured 01/04/2015  . Essential (primary) hypertension 01/04/2015  . HLD (hyperlipidemia) 01/04/2015  . Routine general medical examination at a health care facility 01/04/2015  . Need for vaccination 01/04/2015  . Idiopathic insomnia 01/04/2015    . Cutaneous eruption 01/04/2015  . Special screening for malignant neoplasms, colon 01/04/2015    Prior to Admission medications   Medication Sig Start Date End Date Taking? Authorizing Provider  aspirin 325 MG tablet Take 1 tablet (325 mg total) by mouth daily. 11/17/15  Yes Dustin Flock, MD  atorvastatin (LIPITOR) 20 MG tablet Take 1 tablet (20 mg total) by mouth daily at 6 PM. Patient taking differently: Take 20 mg by mouth every evening.  01/19/16  Yes Glean Hess, MD  lisinopril-hydrochlorothiazide (PRINZIDE,ZESTORETIC) 10-12.5 MG tablet Take 1 tablet by mouth daily. 02/26/16  Yes Glean Hess, MD  traZODone (DESYREL) 50 MG tablet Take 1-2 tablets (50-100 mg total) by mouth at bedtime. Patient taking differently: Take 50 mg by mouth at bedtime.  01/19/16  Yes Glean Hess, MD    No Known Allergies  Past Surgical History:  Procedure Laterality Date  . ABDOMINAL HERNIA REPAIR    . CHOLECYSTECTOMY    . IR GENERIC HISTORICAL  01/12/2016   IR ANGIO INTRA EXTRACRAN SEL INTERNAL CAROTID BILAT MOD SED 01/12/2016 Consuella Lose, MD [facility]  . IR GENERIC HISTORICAL  01/12/2016   IR ANGIO VERTEBRAL SEL VERTEBRAL BILAT MOD SED 01/12/2016 Consuella Lose, MD [facility]  . IR GENERIC HISTORICAL  01/12/2016   IR 3D INDEPENDENT WKST 01/12/2016 Consuella Lose, MD [facility]  . REPLACEMENT TOTAL KNEE Bilateral   . TOTAL ABDOMINAL HYSTERECTOMY      Social History  Substance Use Topics  . Smoking status: Never Smoker  . Smokeless tobacco: Never Used  . Alcohol use No  Comment: occasional     Medication list has been reviewed and updated.   Physical Exam  Constitutional: She is oriented to person, place, and time. She appears well-developed. No distress.  HENT:  Head: Normocephalic and atraumatic.  Neck: Normal range of motion. No thyromegaly present.  Cardiovascular: Normal rate, regular rhythm and normal heart sounds.   Pulmonary/Chest: Effort normal and  breath sounds normal. No respiratory distress. She has no wheezes.  Musculoskeletal: Normal range of motion. She exhibits no edema or tenderness.  Neurological: She is alert and oriented to person, place, and time. She has normal strength. No sensory deficit.  Skin: Skin is warm and dry. No rash noted.  Psychiatric: She has a normal mood and affect. Her behavior is normal. Thought content normal.  Nursing note and vitals reviewed.   BP (!) 158/80   Pulse 76   Resp 16   Ht 5\' 3"  (1.6 m)   Wt 173 lb (78.5 kg)   SpO2 96%   BMI 30.65 kg/m   Assessment and Plan: 1. Mild single current episode of major depressive disorder ([place]) Need to start medication - low dose celexa  2. Essential (primary) hypertension Fair control  3. Thalamic infarction [facility]) Doing well physically - continue exercise such as walking daily  4. Need for influenza vaccination - Flu Vaccine QUAD 36+ mos IM  5. Neuropathy ([place]) Sx could be related to low B12 def Patient will start oral supplement   Halina Maidens, MD [facility]  05/22/2016

## 2016-05-22 NOTE — Patient Instructions (Signed)
Take a B12 supplement daily for feet and legs - try for a month  Continue Trazodone

## 2016-06-06 DIAGNOSIS — H40003 Preglaucoma, unspecified, bilateral: Secondary | ICD-10-CM | POA: Diagnosis not present

## 2016-06-16 ENCOUNTER — Other Ambulatory Visit: Payer: Self-pay | Admitting: Internal Medicine

## 2016-06-17 DIAGNOSIS — H2513 Age-related nuclear cataract, bilateral: Secondary | ICD-10-CM | POA: Diagnosis not present

## 2016-06-19 ENCOUNTER — Encounter: Payer: Self-pay | Admitting: *Deleted

## 2016-06-20 ENCOUNTER — Ambulatory Visit: Payer: Medicare Other | Admitting: Internal Medicine

## 2016-06-21 NOTE — Discharge Instructions (Signed)
Cataract Surgery, Care After °Refer to this sheet in the next few weeks. These instructions provide you with information about caring for yourself after your procedure. Your health care provider may also give you more specific instructions. Your treatment has been planned according to current medical practices, but problems sometimes occur. Call your health care provider if you have any problems or questions after your procedure. °What can I expect after the procedure? °After the procedure, it is common to have: °· Itching. °· Discomfort. °· Fluid discharge. °· Sensitivity to light and to touch. °· Bruising. °Follow these instructions at home: °Eye Care  °· Check your eye every day for signs of infection. Watch for: °¨ Redness, swelling, or pain. °¨ Fluid, blood, or pus. °¨ Warmth. °¨ Bad smell. °Activity  °· Avoid strenuous activities, such as playing contact sports, for as long as told by your health care provider. °· Do not drive or operate heavy machinery until your health care provider approves. °· Do not bend or lift heavy objects . Bending increases pressure in the eye. You can walk, climb stairs, and do light household chores. °· Ask your health care provider when you can return to work. If you work in a dusty environment, you may be advised to wear protective eyewear for a period of time. °General instructions  °· Take or apply over-the-counter and prescription medicines only as told by your health care provider. This includes eye drops. °· Do not touch or rub your eyes. °· If you were given a protective shield, wear it as told by your health care provider. If you were not given a protective shield, wear sunglasses as told by your health care provider to protect your eyes. °· Keep the area around your eye clean and dry. Avoid swimming or allowing water to hit you directly in the face while showering until told by your health care provider. Keep soap and shampoo out of your eyes. °· Do not put a contact lens  into the affected eye or eyes until your health care provider approves. °· Keep all follow-up visits as told by your health care provider. This is important. °Contact a health care provider if: ° °· You have increased bruising around your eye. °· You have pain that is not helped with medicine. °· You have a fever. °· You have redness, swelling, or pain in your eye. °· You have fluid, blood, or pus coming from your incision. °· Your vision gets worse. °Get help right away if: °· You have sudden vision loss. °This information is not intended to replace advice given to you by your health care provider. Make sure you discuss any questions you have with your health care provider. °Document Released: 02/08/2005 Document Revised: 11/30/2015 Document Reviewed: 06/01/2015 °Elsevier Interactive Patient Education © 2017 [employer] ° ° ° ° °General Anesthesia, Adult, Care After °These instructions provide you with information about caring for yourself after your procedure. Your health care provider may also give you more specific instructions. Your treatment has been planned according to current medical practices, but problems sometimes occur. Call your health care provider if you have any problems or questions after your procedure. °What can I expect after the procedure? °After the procedure, it is common to have: °· Vomiting. °· A sore throat. °· Mental slowness. °It is common to feel: °· Nauseous. °· Cold or shivery. °· Sleepy. °· Tired. °· Sore or achy, even in parts of your body where you did not have surgery. °Follow these instructions at   home: °For at least 24 hours after the procedure:  °· Do not: °¨ Participate in activities where you could fall or become injured. °¨ Drive. °¨ Use heavy machinery. °¨ Drink alcohol. °¨ Take sleeping pills or medicines that cause drowsiness. °¨ Make important decisions or sign legal documents. °¨ Take care of children on your own. °· Rest. °Eating and drinking  °· If you vomit, drink  water, juice, or soup when you can drink without vomiting. °· Drink enough fluid to keep your urine clear or pale yellow. °· Make sure you have little or no nausea before eating solid foods. °· Follow the diet recommended by your health care provider. °General instructions  °· Have a responsible adult stay with you until you are awake and alert. °· Return to your normal activities as told by your health care provider. Ask your health care provider what activities are safe for you. °· Take over-the-counter and prescription medicines only as told by your health care provider. °· If you smoke, do not smoke without supervision. °· Keep all follow-up visits as told by your health care provider. This is important. °Contact a health care provider if: °· You continue to have nausea or vomiting at home, and medicines are not helpful. °· You cannot drink fluids or start eating again. °· You cannot urinate after 8-12 hours. °· You develop a skin rash. °· You have fever. °· You have increasing redness at the site of your procedure. °Get help right away if: °· You have difficulty breathing. °· You have chest pain. °· You have unexpected bleeding. °· You feel that you are having a life-threatening or urgent problem. °This information is not intended to replace advice given to you by your health care provider. Make sure you discuss any questions you have with your health care provider. °Document Released: 10/28/2000 Document Revised: 12/25/2015 Document Reviewed: 07/06/2015 °Elsevier Interactive Patient Education © 2017 [employer] Inc. ° °

## 2016-06-24 ENCOUNTER — Encounter
Admission: RE | Disposition: A | Discharge status: Home / Self Care | Payer: Self-pay | Source: Ambulatory Visit | Attending: Ophthalmology

## 2016-06-24 ENCOUNTER — Ambulatory Visit: Payer: Medicare Other | Admitting: Anesthesiology

## 2016-06-24 ENCOUNTER — Ambulatory Visit
Admission: RE | Admit: 2016-06-24 | Discharge: 2016-06-24 | Disposition: A | Discharge status: Home / Self Care | Payer: Medicare Other | Source: Ambulatory Visit | Attending: Ophthalmology | Admitting: Ophthalmology

## 2016-06-24 DIAGNOSIS — E7800 Pure hypercholesterolemia, unspecified: Secondary | ICD-10-CM | POA: Insufficient documentation

## 2016-06-24 DIAGNOSIS — Z96653 Presence of artificial knee joint, bilateral: Secondary | ICD-10-CM | POA: Diagnosis not present

## 2016-06-24 DIAGNOSIS — I10 Essential (primary) hypertension: Secondary | ICD-10-CM | POA: Insufficient documentation

## 2016-06-24 DIAGNOSIS — Z90710 Acquired absence of both cervix and uterus: Secondary | ICD-10-CM | POA: Diagnosis not present

## 2016-06-24 DIAGNOSIS — H2511 Age-related nuclear cataract, right eye: Secondary | ICD-10-CM | POA: Insufficient documentation

## 2016-06-24 DIAGNOSIS — F329 Major depressive disorder, single episode, unspecified: Secondary | ICD-10-CM | POA: Diagnosis not present

## 2016-06-24 DIAGNOSIS — H2513 Age-related nuclear cataract, bilateral: Secondary | ICD-10-CM | POA: Diagnosis not present

## 2016-06-24 DIAGNOSIS — I69364 Other paralytic syndrome following cerebral infarction affecting left non-dominant side: Secondary | ICD-10-CM | POA: Insufficient documentation

## 2016-06-24 HISTORY — PX: CATARACT EXTRACTION W/PHACO: SHX586

## 2016-06-24 SURGERY — PHACOEMULSIFICATION, CATARACT, WITH IOL INSERTION
Anesthesia: Monitor Anesthesia Care | Site: Eye | Laterality: Right | Wound class: Clean

## 2016-06-24 MED ORDER — ARMC OPHTHALMIC DILATING DROPS
1.0000 "application " | OPHTHALMIC | Status: DC | PRN
  Administered 2016-06-24 (×3): 1 via OPHTHALMIC

## 2016-06-24 MED ORDER — LACTATED RINGERS IV SOLN: INTRAVENOUS | Status: DC

## 2016-06-24 MED ORDER — BRIMONIDINE TARTRATE 0.2 % OP SOLN
OPHTHALMIC | Status: DC | PRN
  Administered 2016-06-24: 1 [drp] via OPHTHALMIC

## 2016-06-24 MED ORDER — TIMOLOL MALEATE 0.5 % OP SOLN
OPHTHALMIC | Status: DC | PRN
  Administered 2016-06-24: 1 [drp] via OPHTHALMIC

## 2016-06-24 MED ORDER — MOXIFLOXACIN HCL 0.5 % OP SOLN
1.0000 [drp] | OPHTHALMIC | Status: DC | PRN
  Administered 2016-06-24 (×3): 1 [drp] via OPHTHALMIC

## 2016-06-24 MED ORDER — FENTANYL CITRATE (PF) 100 MCG/2ML IJ SOLN
INTRAMUSCULAR | Status: DC | PRN
Start: 2016-06-24 — End: 2016-06-24
  Administered 2016-06-24 (×2): 50 ug via INTRAVENOUS

## 2016-06-24 MED ORDER — NA HYALUR & NA CHOND-NA HYALUR 0.4-0.35 ML IO KIT
PACK | INTRAOCULAR | Status: DC | PRN
  Administered 2016-06-24: 1 mL via INTRAOCULAR

## 2016-06-24 MED ORDER — CEFUROXIME OPHTHALMIC INJECTION 1 MG/0.1 ML
INJECTION | OPHTHALMIC | Status: DC | PRN
  Administered 2016-06-24: .3 mL via INTRACAMERAL

## 2016-06-24 MED ORDER — LIDOCAINE HCL (PF) 4 % IJ SOLN
INTRAOCULAR | Status: DC | PRN
  Administered 2016-06-24: 1 mL via OPHTHALMIC

## 2016-06-24 MED ORDER — MIDAZOLAM HCL 2 MG/2ML IJ SOLN
INTRAMUSCULAR | Status: DC | PRN
  Administered 2016-06-24: 2 mg via INTRAVENOUS

## 2016-06-24 MED ORDER — EPINEPHRINE PF 1 MG/ML IJ SOLN
INTRAOCULAR | Status: DC | PRN
  Administered 2016-06-24: 92 mL via OPHTHALMIC

## 2016-06-24 SURGICAL SUPPLY — 28 items
APPLICATOR COTTON TIP 3IN (MISCELLANEOUS) ×2 IMPLANT
CANNULA ANT/CHMB 27GA (MISCELLANEOUS) ×2 IMPLANT
DISSECTOR HYDRO NUCLEUS 50X22 (MISCELLANEOUS) ×2 IMPLANT
GLOVE BIO SURGEON STRL SZ7 (GLOVE) ×2 IMPLANT
GLOVE SURG LX 6.5 MICRO (GLOVE) ×1
GLOVE SURG LX STRL 6.5 MICRO (GLOVE) ×1 IMPLANT
GOWN STRL REUS W/ TWL LRG LVL3 (GOWN DISPOSABLE) ×2 IMPLANT
GOWN STRL REUS W/TWL LRG LVL3 (GOWN DISPOSABLE) ×2
LENS IOL ACRYSOF IQ 21.5 (Intraocular Lens) ×2 IMPLANT
MARKER SKIN DUAL TIP RULER LAB (MISCELLANEOUS) ×2 IMPLANT
NEEDLE FILTER BLUNT 18X 1/2SAF (NEEDLE) ×1
NEEDLE FILTER BLUNT 18X1 1/2 (NEEDLE) ×1 IMPLANT
PACK CATARACT BRASINGTON (MISCELLANEOUS) ×2 IMPLANT
PACK EYE AFTER SURG (MISCELLANEOUS) ×2 IMPLANT
PACK OPTHALMIC (MISCELLANEOUS) ×2 IMPLANT
RING MALYGIN 7.0 (MISCELLANEOUS) IMPLANT
SOL BAL SALT 15ML (MISCELLANEOUS)
SOLUTION BAL SALT 15ML (MISCELLANEOUS) IMPLANT
SUT ETHILON 10-0 CS-B-6CS-B-6 (SUTURE)
SUT VICRYL  9 0 (SUTURE)
SUT VICRYL 9 0 (SUTURE) IMPLANT
SUTURE EHLN 10-0 CS-B-6CS-B-6 (SUTURE) IMPLANT
SYR 3ML LL SCALE MARK (SYRINGE) ×2 IMPLANT
SYR TB 1ML LUER SLIP (SYRINGE) ×2 IMPLANT
WATER STERILE IRR 250ML POUR (IV SOLUTION) ×2 IMPLANT
WATER STERILE IRR 500ML POUR (IV SOLUTION) IMPLANT
WICK EYE OCUCEL (MISCELLANEOUS) IMPLANT
WIPE NON LINTING 3.25X3.25 (MISCELLANEOUS) ×2 IMPLANT

## 2016-06-24 NOTE — H&P (Signed)
H+P reviewed and is up to date, please see paper chart.  

## 2016-06-24 NOTE — Anesthesia Postprocedure Evaluation (Signed)
Anesthesia Post Note  Patient: Bonnie Jackson  Procedure(s) Performed: Procedure(s) (LRB): CATARACT EXTRACTION PHACO AND INTRAOCULAR LENS PLACEMENT (IOC) (Right)  Patient location during evaluation: PACU Anesthesia Type: MAC Level of consciousness: awake and alert Pain management: pain level controlled Vital Signs Assessment: post-procedure vital signs reviewed and stable Respiratory status: spontaneous breathing, nonlabored ventilation, respiratory function stable and patient connected to nasal cannula oxygen Cardiovascular status: stable and blood pressure returned to baseline Anesthetic complications: no    Trecia Rogers

## 2016-06-24 NOTE — Anesthesia Procedure Notes (Signed)
Procedure Name: MAC Performed by: Mabelle Mungin Pre-anesthesia Checklist: Patient identified, Emergency Drugs available, Suction available, Timeout performed and Patient being monitored Patient Re-evaluated:Patient Re-evaluated prior to inductionOxygen Delivery Method: Nasal cannula Placement Confirmation: positive ETCO2       

## 2016-06-24 NOTE — Op Note (Signed)
Date of Surgery: 06/24/2016  PREOPERATIVE DIAGNOSES: Visually significant nuclear sclerotic cataract, right eye.  POSTOPERATIVE DIAGNOSES: Same  PROCEDURES PERFORMED: Cataract extraction with intraocular lens implant, right eye.  SURGEON: Almon Hercules, M.D.  ANESTHESIA: MAC and topical  IMPLANTS: [id number] +21.5 D  Implant Name Type Inv. Item Serial No. Manufacturer Lot No. LRB No. Used  LENS IOL ACRYSOF IQ 21.5 - [id number] Intraocular Lens LENS IOL ACRYSOF IQ 21.5 [id number] [employer]   Right 1     COMPLICATIONS: None.  DESCRIPTION OF PROCEDURE: Therapeutic options were discussed with the patient preoperatively, including a discussion of risks and benefits of surgery. Informed consent was obtained. An IOL-Master and immersion biometry were used to take the lens measurements, and a dilated fundus exam was performed within 6 months of the surgical date.  The patient was premedicated and brought to the operating room and placed on the operating table in the supine position. After adequate anesthesia, the patient was prepped and draped in the usual sterile ophthalmic fashion. A wire lid speculum was inserted and the microscope was positioned. A Superblade was used to create a paracentesis site at the limbus and a small amount of dilute preservative free lidocaine was instilled into the anterior chamber, followed by dispersive viscoelastic. A clear corneal incision was created temporally using a 2.4 mm keratome blade. Capsulorrhexis was then performed. In situ phacoemulsification was performed.  Cortical material was removed with the irrigation-aspiration unit. Dispersive viscoelastic was instilled to open the capsular bag. A posterior chamber intraocular lens with the specifications above was inserted and positioned. Irrigation-aspiration was used to remove all viscoelastic. Cefuroxime 1cc was instilled into the anterior chamber, and the corneal incision was checked and found to be water tight.  The eyelid speculum was removed.  The operative eye was covered with protective goggles after instilling 1 drop of timolol and brimonidine. The patient tolerated the procedure well. There were no complications.

## 2016-06-24 NOTE — Transfer of Care (Signed)
Immediate Anesthesia Transfer of Care Note  Patient: Bonnie Jackson  Procedure(s) Performed: Procedure(s) with comments: CATARACT EXTRACTION PHACO AND INTRAOCULAR LENS PLACEMENT (IOC) (Right) - RIGHT  Patient Location: PACU  Anesthesia Type: MAC  Level of Consciousness: awake, alert  and patient cooperative  Airway and Oxygen Therapy: Patient Spontanous Breathing and Patient connected to supplemental oxygen  Post-op Assessment: Post-op Vital signs reviewed, Patient's Cardiovascular Status Stable, Respiratory Function Stable, Patent Airway and No signs of Nausea or vomiting  Post-op Vital Signs: Reviewed and stable  Complications: No apparent anesthesia complications

## 2016-06-24 NOTE — Anesthesia Preprocedure Evaluation (Signed)
Anesthesia Evaluation  Patient identified by MRN, date of birth, ID band Patient awake    Reviewed: Allergy & Precautions, H&P , NPO status , Patient's Chart, lab work & pertinent test results, reviewed documented beta blocker date and time   Airway Mallampati: I  TM Distance: >3 FB Neck ROM: full    Dental no notable dental hx.    Pulmonary neg pulmonary ROS,    Pulmonary exam normal breath sounds clear to auscultation       Cardiovascular Exercise Tolerance: Good hypertension,  Rhythm:regular Rate:Normal     Neuro/Psych CVA, No Residual Symptoms negative psych ROS   GI/Hepatic negative GI ROS, Neg liver ROS,   Endo/Other  negative endocrine ROS  Renal/GU negative Renal ROS  negative genitourinary   Musculoskeletal   Abdominal   Peds  Hematology negative hematology ROS (+)   Anesthesia Other Findings   Reproductive/Obstetrics negative OB ROS                             Anesthesia Physical Anesthesia Plan  ASA: II  Anesthesia Plan: MAC   Post-op Pain Management:    Induction:   Airway Management Planned:   Additional Equipment:   Intra-op Plan:   Post-operative Plan:   Informed Consent: I have reviewed the patients History and Physical, chart, labs and discussed the procedure including the risks, benefits and alternatives for the proposed anesthesia with the patient or authorized representative who has indicated his/her understanding and acceptance.   Dental Advisory Given  Plan Discussed with: CRNA and Anesthesiologist  Anesthesia Plan Comments:         Anesthesia Quick Evaluation

## 2016-06-25 ENCOUNTER — Encounter: Payer: Self-pay | Admitting: Ophthalmology

## 2016-07-10 ENCOUNTER — Encounter: Payer: Self-pay | Admitting: Internal Medicine

## 2016-07-10 ENCOUNTER — Ambulatory Visit (INDEPENDENT_AMBULATORY_CARE_PROVIDER_SITE_OTHER): Payer: Medicare Other | Admitting: Internal Medicine

## 2016-07-10 VITALS — BP 136/82 | HR 74 | Resp 16 | Ht 63.0 in | Wt 180.0 lb

## 2016-07-10 DIAGNOSIS — I10 Essential (primary) hypertension: Secondary | ICD-10-CM

## 2016-07-10 DIAGNOSIS — I639 Cerebral infarction, unspecified: Secondary | ICD-10-CM

## 2016-07-10 DIAGNOSIS — L7211 Pilar cyst: Secondary | ICD-10-CM | POA: Diagnosis not present

## 2016-07-10 DIAGNOSIS — D485 Neoplasm of uncertain behavior of skin: Secondary | ICD-10-CM | POA: Diagnosis not present

## 2016-07-10 DIAGNOSIS — F320 Major depressive disorder, single episode, mild: Secondary | ICD-10-CM | POA: Diagnosis not present

## 2016-07-10 NOTE — Patient Instructions (Addendum)
Increase Celexa to 20 mg once a day.  Call for new prescription if helpful.  Lesion on nose might be a Basal Cell carcinoma

## 2016-07-10 NOTE — Progress Notes (Signed)
Date:  07/10/2016   Name:  Bonnie Jackson   DOB:  24-Apr-1941   MRN:  [id number]   Chief Complaint: Hypertension Hypertension  This is a chronic problem. The current episode started more than 1 year ago. The problem is unchanged. The problem is controlled. Pertinent negatives include no chest pain, headaches, palpitations or shortness of breath.   Mood disorder - started on Celexa last visit.  Not sure that it has helped.  She is sleeping better with trazodone. Mood is just down but not suicidal. She has had no side effects.  Not looking forward to Christmas despite family visiting.  Skin lesion - clear smooth bump on side of nose came up about a month ago. Not tender and not changing.  Review of Systems  Constitutional: Positive for fatigue. Negative for chills and fever.  HENT: Negative for congestion.   Respiratory: Negative for cough, chest tightness, shortness of breath and wheezing.   Cardiovascular: Negative for chest pain, palpitations and leg swelling.  Gastrointestinal: Negative for abdominal pain.  Musculoskeletal: Negative for arthralgias.  Skin: Positive for color change (bump).  Neurological: Negative for dizziness and headaches.  Psychiatric/Behavioral: Positive for dysphoric mood. Negative for confusion and sleep disturbance. The patient is not nervous/anxious.     Patient Active Problem List   Diagnosis Date Noted  . Mild single current episode of major depressive disorder ([place]) 05/22/2016  . OAB (overactive bladder) 05/22/2016  . Cataract of right eye 01/19/2016  . Eversion of the eyelid 01/19/2016  . Neoplasm of uncertain behavior of skin 01/19/2016  . Thalamic infarction ([place]) 11/16/2015  . Aneurysm, cerebral, nonruptured 01/04/2015  . Essential (primary) hypertension 01/04/2015  . HLD (hyperlipidemia) 01/04/2015  . Need for vaccination 01/04/2015  . Idiopathic insomnia 01/04/2015  . Cutaneous eruption 01/04/2015  . Special screening for malignant neoplasms,  colon 01/04/2015    Prior to Admission medications   Medication Sig Start Date End Date Taking? Authorizing Provider  aspirin 325 MG tablet Take 1 tablet (325 mg total) by mouth daily. 11/17/15  Yes Dustin Flock, MD  atorvastatin (LIPITOR) 20 MG tablet Take 1 tablet (20 mg total) by mouth daily at 6 PM. Patient taking differently: Take 20 mg by mouth every evening.  01/19/16  Yes Glean Hess, MD  citalopram (CELEXA) 10 MG tablet Take 1 tablet (10 mg total) by mouth daily. 05/22/16  Yes Glean Hess, MD  lisinopril-hydrochlorothiazide (PRINZIDE,ZESTORETIC) 10-12.5 MG tablet Take 1 tablet by mouth daily. 02/26/16  Yes Glean Hess, MD  traZODone (DESYREL) 50 MG tablet Take 1-2 tablets (50-100 mg total) by mouth at bedtime. Patient taking differently: Take 50 mg by mouth at bedtime.  01/19/16  Yes Glean Hess, MD    No Known Allergies  Past Surgical History:  Procedure Laterality Date  . ABDOMINAL HERNIA REPAIR    . CATARACT EXTRACTION W/PHACO Right 06/24/2016   Procedure: CATARACT EXTRACTION PHACO AND INTRAOCULAR LENS PLACEMENT (IOC);  Surgeon: Ronnell Freshwater, MD;  Location: [place];  Service: Ophthalmology;  Laterality: Right;  RIGHT  . CHOLECYSTECTOMY    . IR GENERIC HISTORICAL  01/12/2016   IR ANGIO INTRA EXTRACRAN SEL INTERNAL CAROTID BILAT MOD SED 01/12/2016 Consuella Lose, MD [facility]  . IR GENERIC HISTORICAL  01/12/2016   IR ANGIO VERTEBRAL SEL VERTEBRAL BILAT MOD SED 01/12/2016 Consuella Lose, MD [facility]  . IR GENERIC HISTORICAL  01/12/2016   IR 3D INDEPENDENT WKST 01/12/2016 Consuella Lose, MD [facility]  . REPLACEMENT  TOTAL KNEE Bilateral   . TOTAL ABDOMINAL HYSTERECTOMY      Social History  Substance Use Topics  . Smoking status: Never Smoker  . Smokeless tobacco: Never Used  . Alcohol use 0.6 oz/week    1 Cans of beer per week     Comment:       Medication list has been reviewed and updated.   Physical Exam    Constitutional: She is oriented to person, place, and time. She appears well-developed. No distress.  HENT:  Head: Normocephalic and atraumatic.  Pulmonary/Chest: Effort normal. No respiratory distress.  Musculoskeletal: Normal range of motion.  Neurological: She is alert and oriented to person, place, and time.  Skin: Skin is warm and dry. No rash noted.  Clear smooth 1 mm papule on bridge of nose  Psychiatric: She has a normal mood and affect. Her behavior is normal. Thought content normal.  Nursing note and vitals reviewed.   BP 136/82   Pulse 74   Resp 16   Ht 5\' 3"  (1.6 m)   Wt 180 lb (81.6 kg)   SpO2 97%   BMI 31.89 kg/m   Assessment and Plan: 1. Mild single current episode of major depressive disorder (HCC) Increase celexa to 20 mg - call for new Rx if helpful  2. Essential (primary) hypertension controlled  3. Neoplasm of uncertain behavior of skin Recommend Dermatology evaluation   Halina Maidens, MD [facility]  07/10/2016

## 2016-08-20 ENCOUNTER — Other Ambulatory Visit: Payer: Self-pay | Admitting: Internal Medicine

## 2016-08-20 DIAGNOSIS — I639 Cerebral infarction, unspecified: Secondary | ICD-10-CM

## 2016-08-20 DIAGNOSIS — I6381 Other cerebral infarction due to occlusion or stenosis of small artery: Secondary | ICD-10-CM

## 2016-08-30 DIAGNOSIS — H401131 Primary open-angle glaucoma, bilateral, mild stage: Secondary | ICD-10-CM | POA: Diagnosis not present

## 2016-08-30 NOTE — Telephone Encounter (Signed)
Unable to reach pt, keep getting a busy tone

## 2016-10-02 ENCOUNTER — Other Ambulatory Visit: Payer: Self-pay | Admitting: Internal Medicine

## 2016-10-02 DIAGNOSIS — R42 Dizziness and giddiness: Secondary | ICD-10-CM

## 2016-10-03 DIAGNOSIS — H02132 Senile ectropion of right lower eyelid: Secondary | ICD-10-CM | POA: Diagnosis not present

## 2016-10-03 DIAGNOSIS — H04521 Eversion of right lacrimal punctum: Secondary | ICD-10-CM | POA: Diagnosis not present

## 2016-10-16 ENCOUNTER — Other Ambulatory Visit: Payer: Self-pay | Admitting: Internal Medicine

## 2016-10-16 DIAGNOSIS — I10 Essential (primary) hypertension: Secondary | ICD-10-CM

## 2016-10-17 ENCOUNTER — Other Ambulatory Visit: Payer: Self-pay | Admitting: Internal Medicine

## 2016-10-17 DIAGNOSIS — G4700 Insomnia, unspecified: Secondary | ICD-10-CM

## 2016-10-17 MED ORDER — TRAZODONE HCL 50 MG PO TABS: 50.0000 mg | ORAL_TABLET | Freq: Every day | ORAL | 1 refills | Status: DC

## 2016-10-25 DIAGNOSIS — H401131 Primary open-angle glaucoma, bilateral, mild stage: Secondary | ICD-10-CM | POA: Diagnosis not present

## 2016-10-28 ENCOUNTER — Encounter: Payer: Self-pay | Admitting: Internal Medicine

## 2016-10-28 ENCOUNTER — Ambulatory Visit (INDEPENDENT_AMBULATORY_CARE_PROVIDER_SITE_OTHER): Payer: Medicare Other | Admitting: Internal Medicine

## 2016-10-28 VITALS — BP 128/78 | HR 86 | Ht 63.5 in | Wt 182.0 lb

## 2016-10-28 DIAGNOSIS — I639 Cerebral infarction, unspecified: Secondary | ICD-10-CM

## 2016-10-28 DIAGNOSIS — M7521 Bicipital tendinitis, right shoulder: Secondary | ICD-10-CM

## 2016-10-28 DIAGNOSIS — I10 Essential (primary) hypertension: Secondary | ICD-10-CM

## 2016-10-28 DIAGNOSIS — I6381 Other cerebral infarction due to occlusion or stenosis of small artery: Secondary | ICD-10-CM

## 2016-10-28 NOTE — Patient Instructions (Signed)
Use heat or ice on shoulder for 20 minutes three times per day  Take Tylenol 1000 mg tid as needed for shoulder pain  May hold aspirin for 2 weeks before eye surgery

## 2016-10-28 NOTE — Progress Notes (Signed)
Date:  10/28/2016   Name:  Bonnie Jackson   DOB:  1941/07/31   MRN:  [id number]   Chief Complaint: Arm Pain (Rt arm- X 1 week. Lifted recliner and hurt since. Needs handicap sticker. Discuss Aspirin for eye surgery. ) Arm Pain   The incident occurred 5 to 7 days ago. The incident occurred at home. Injury mechanism: lifting heavy object. Pertinent negatives include no chest pain.  Hypertension  This is a chronic problem. The problem is controlled (normal at home - cuff correlated here c/w office related elevations). Pertinent negatives include no chest pain, headaches or shortness of breath.   Lower lid droop - needs surgery and needs to stop aspirin.  CVA - need handicapped parking placard. Has some tingling in both toes.  Unable to order B12 but has been taking oral supplement B12 with out much benefit.  Getting around with a cane now.   Review of Systems  Constitutional: Negative for chills, fatigue and fever.  Respiratory: Negative for chest tightness and shortness of breath.   Cardiovascular: Negative for chest pain and leg swelling.  Musculoskeletal: Positive for arthralgias.  Neurological: Positive for weakness. Negative for dizziness and headaches.    Patient Active Problem List   Diagnosis Date Noted  . Mild single current episode of major depressive disorder ([place]) 05/22/2016  . OAB (overactive bladder) 05/22/2016  . Cataract of right eye 01/19/2016  . Eversion of the eyelid 01/19/2016  . Neoplasm of uncertain behavior of skin 01/19/2016  . Thalamic infarction ([place]) 11/16/2015  . Aneurysm, cerebral, nonruptured 01/04/2015  . Essential (primary) hypertension 01/04/2015  . HLD (hyperlipidemia) 01/04/2015  . Need for vaccination 01/04/2015  . Idiopathic insomnia 01/04/2015  . Cutaneous eruption 01/04/2015  . Special screening for malignant neoplasms, colon 01/04/2015    Prior to Admission medications   Medication Sig Start Date End Date Taking? Authorizing Provider    aspirin 325 MG tablet Take 1 tablet (325 mg total) by mouth daily. 11/17/15  Yes Dustin Flock, MD  atorvastatin (LIPITOR) 20 MG tablet TAKE 1 TABLET(20 MG) BY MOUTH DAILY AT 6 PM 08/20/16  Yes Glean Hess, MD  citalopram (CELEXA) 10 MG tablet Take 1 tablet (10 mg total) by mouth daily. 05/22/16  Yes Glean Hess, MD  latanoprost (XALATAN) 0.005 % ophthalmic solution 1 drop at bedtime.   Yes Historical Provider, MD  lisinopril-hydrochlorothiazide (PRINZIDE,ZESTORETIC) 10-12.5 MG tablet TAKE 1 TABLET BY MOUTH DAILY 10/16/16  Yes Glean Hess, MD  meclizine (ANTIVERT) 25 MG tablet TAKE 1 TABLET(25 MG) BY MOUTH THREE TIMES DAILY AS NEEDED FOR DIZZINESS 10/02/16  Yes Glean Hess, MD  traZODone (DESYREL) 50 MG tablet Take 1 tablet (50 mg total) by mouth at bedtime. 10/17/16  Yes Glean Hess, MD    No Known Allergies  Past Surgical History:  Procedure Laterality Date  . ABDOMINAL HERNIA REPAIR    . CATARACT EXTRACTION W/PHACO Right 06/24/2016   Procedure: CATARACT EXTRACTION PHACO AND INTRAOCULAR LENS PLACEMENT (IOC);  Surgeon: Ronnell Freshwater, MD;  Location: [place];  Service: Ophthalmology;  Laterality: Right;  RIGHT  . CHOLECYSTECTOMY    . IR GENERIC HISTORICAL  01/12/2016   IR ANGIO INTRA EXTRACRAN SEL INTERNAL CAROTID BILAT MOD SED 01/12/2016 Consuella Lose, MD [facility]  . IR GENERIC HISTORICAL  01/12/2016   IR ANGIO VERTEBRAL SEL VERTEBRAL BILAT MOD SED 01/12/2016 Consuella Lose, MD [facility]  . IR GENERIC HISTORICAL  01/12/2016   IR 3D INDEPENDENT WKST  01/12/2016 Consuella Lose, MD [facility]  . REPLACEMENT TOTAL KNEE Bilateral   . TOTAL ABDOMINAL HYSTERECTOMY      Social History  Substance Use Topics  . Smoking status: Never Smoker  . Smokeless tobacco: Never Used  . Alcohol use 0.6 oz/week    1 Cans of beer per week     Comment:       Medication list has been reviewed and updated.   Physical Exam  Constitutional: She is  oriented to person, place, and time. She appears well-developed. No distress.  HENT:  Head: Normocephalic and atraumatic.  Neck: Normal range of motion.  Cardiovascular: Normal rate, regular rhythm and normal heart sounds.   Pulmonary/Chest: Effort normal and breath sounds normal. No respiratory distress. She has no wheezes.  Musculoskeletal:       Right shoulder: She exhibits decreased range of motion, tenderness (over biceps tendon) and spasm (right trapezius muscle).  Neurological: She is alert and oriented to person, place, and time. She has normal strength. Coordination (decreased balance) abnormal. Gait normal.  Skin: Skin is warm and dry. No rash noted.  Psychiatric: She has a normal mood and affect. Her behavior is normal. Thought content normal.  Nursing note and vitals reviewed.   BP 128/78   Pulse 86   Ht 5' 3.5" (1.613 m)   Wt 182 lb (82.6 kg)   SpO2 97%   BMI 31.73 kg/m   Assessment and Plan: 1. Biceps tendinitis of right upper extremity Use heat or ice Tylenol every 6 hours  2. Essential (primary) hypertension controlled  3. Thalamic infarction [facility]) Handicapped placard application given May stop ASA 2 weeks before eye surgery Continue oral B12 - consider Neurology referral   No orders of the defined types were placed in this encounter.   Halina Maidens, MD [facility]  10/28/2016

## 2016-12-05 DIAGNOSIS — H02132 Senile ectropion of right lower eyelid: Secondary | ICD-10-CM | POA: Diagnosis not present

## 2017-01-08 ENCOUNTER — Ambulatory Visit: Payer: Medicare Other | Admitting: Internal Medicine

## 2017-01-12 ENCOUNTER — Other Ambulatory Visit: Payer: Self-pay | Admitting: Internal Medicine

## 2017-01-12 DIAGNOSIS — I10 Essential (primary) hypertension: Secondary | ICD-10-CM

## 2017-02-10 ENCOUNTER — Ambulatory Visit: Payer: Medicare Other

## 2017-02-14 ENCOUNTER — Telehealth: Payer: Self-pay

## 2017-02-14 NOTE — Telephone Encounter (Signed)
Patient declined AWV 

## 2017-02-14 NOTE — Telephone Encounter (Signed)
Left message for patient to reschedule AWV

## 2017-02-17 ENCOUNTER — Other Ambulatory Visit: Payer: Self-pay | Admitting: Internal Medicine

## 2017-02-17 DIAGNOSIS — Z1231 Encounter for screening mammogram for malignant neoplasm of breast: Secondary | ICD-10-CM

## 2017-03-11 ENCOUNTER — Ambulatory Visit
Admission: RE | Admit: 2017-03-11 | Discharge: 2017-03-11 | Disposition: A | Discharge status: Home / Self Care | Payer: Medicare Other | Source: Ambulatory Visit | Attending: Internal Medicine | Admitting: Internal Medicine

## 2017-03-11 DIAGNOSIS — Z1231 Encounter for screening mammogram for malignant neoplasm of breast: Secondary | ICD-10-CM | POA: Insufficient documentation

## 2017-03-27 ENCOUNTER — Ambulatory Visit (INDEPENDENT_AMBULATORY_CARE_PROVIDER_SITE_OTHER): Payer: Medicare Other | Admitting: Internal Medicine

## 2017-03-27 ENCOUNTER — Encounter: Payer: Self-pay | Admitting: Internal Medicine

## 2017-03-27 VITALS — BP 132/84 | HR 75 | Ht 63.5 in | Wt 186.0 lb

## 2017-03-27 DIAGNOSIS — K219 Gastro-esophageal reflux disease without esophagitis: Secondary | ICD-10-CM | POA: Insufficient documentation

## 2017-03-27 DIAGNOSIS — I6381 Other cerebral infarction due to occlusion or stenosis of small artery: Secondary | ICD-10-CM

## 2017-03-27 DIAGNOSIS — E782 Mixed hyperlipidemia: Secondary | ICD-10-CM

## 2017-03-27 DIAGNOSIS — I639 Cerebral infarction, unspecified: Secondary | ICD-10-CM

## 2017-03-27 DIAGNOSIS — Z0000 Encounter for general adult medical examination without abnormal findings: Secondary | ICD-10-CM

## 2017-03-27 DIAGNOSIS — R253 Fasciculation: Secondary | ICD-10-CM | POA: Diagnosis not present

## 2017-03-27 DIAGNOSIS — F320 Major depressive disorder, single episode, mild: Secondary | ICD-10-CM

## 2017-03-27 DIAGNOSIS — I10 Essential (primary) hypertension: Secondary | ICD-10-CM | POA: Diagnosis not present

## 2017-03-27 DIAGNOSIS — R2681 Unsteadiness on feet: Secondary | ICD-10-CM | POA: Insufficient documentation

## 2017-03-27 LAB — POCT URINALYSIS DIPSTICK
Blood, UA: NEGATIVE
Glucose, UA: NEGATIVE
LEUKOCYTES UA: NEGATIVE
NITRITE UA: NEGATIVE
PH UA: 5 (ref 5.0–8.0)
Protein, UA: NEGATIVE
Spec Grav, UA: 1.025 (ref 1.010–1.025)
UROBILINOGEN UA: 0.2 U/dL

## 2017-03-27 MED ORDER — ATORVASTATIN CALCIUM 20 MG PO TABS: 20.0000 mg | ORAL_TABLET | Freq: Every day | ORAL | 3 refills | Status: DC

## 2017-03-27 NOTE — Addendum Note (Signed)
Addended by: Glean Hess on: 03/27/2017 02:51 PM   Modules accepted: Orders

## 2017-03-27 NOTE — Patient Instructions (Signed)
Health Maintenance  Topic Date Due  . TETANUS/TDAP  01/10/1960  . DEXA SCAN  01/09/2006  . INFLUENZA VACCINE  05/05/2017 (Originally 03/05/2017)  . PNA vac Low Risk Adult  Completed

## 2017-03-27 NOTE — Progress Notes (Signed)
Patient: Bonnie Jackson, Female    DOB: May 03, 1941, 76 y.o.   MRN: [id number] Visit Date: 03/27/2017  Today's Provider: Halina Maidens, MD   Chief Complaint  Patient presents with  . Medicare Wellness    Mammo was week ago- negative. No Breast Exam needed. Patient is fasting.   Marland Kitchen Hypertension  . Hyperlipidemia  . Depression   Subjective:    Annual wellness visit [name] is a 76 y.o. female who presents today for her Subsequent Annual Wellness Visit. She feels well. She reports exercising by walking her dog. She reports she is sleeping fairly well. She had a recent mammogram that was normal.  No breast problems.  Aged out of pap smears.  ----------------------------------------------------------- Hypertension  This is a chronic problem. The problem is controlled. Pertinent negatives include no chest pain, headaches, palpitations or shortness of breath.  Hyperlipidemia  This is a chronic problem. The problem is controlled. Recent lipid tests were reviewed and are normal. Associated symptoms include myalgias. Pertinent negatives include no chest pain or shortness of breath. Current antihyperlipidemic treatment includes statins. The current treatment provides significant improvement of lipids.  Depression         This is a chronic problem.The problem is unchanged.  Associated symptoms include myalgias.  Associated symptoms include no fatigue and no headaches.  Past treatments include SSRIs - Selective serotonin reuptake inhibitors.  Compliance with treatment is good. Muscle twitching - She has noticed varying muscles seem to quiver when she goes to bed at night and lasts for a few minutes but is not painful. She is able to go to sleep. She doesn't have any restless leg type symptoms with a need to move her feet around or to get up and walk. She does not do much exercise due to arthritis of her knees as well as poor balance.  Foot numbness - mild transient tingling at the soles of both feet at  night in bed. It only lasts a short period of time and does not interrupt her sleep. She does not notice similar symptoms during the daytime.  Burning in throat - at night after reclining she feels burning in her upper throat and a tightness. It lasts a few minutes and then resolve spontaneously. She denies significant heartburn or reflux. She does sleep on one pillow.  Gait imbalance - trying to walk for exercise but notices that she feels very unsteady on her feet. She is very careful and has not fallen. He uses a cane for stabilization. She walks her dog who is very old baby carriage.  Review of Systems  Constitutional: Negative for chills, fatigue and fever.  HENT: Negative for congestion, hearing loss, tinnitus, trouble swallowing and voice change.   Eyes: Negative for visual disturbance.  Respiratory: Negative for cough, chest tightness, shortness of breath and wheezing.   Cardiovascular: Negative for chest pain, palpitations and leg swelling.  Gastrointestinal: Negative for abdominal pain, constipation, diarrhea and vomiting.       Mild reflux at night   Endocrine: Negative for polydipsia and polyuria.  Genitourinary: Negative for dysuria, frequency, genital sores, vaginal bleeding and vaginal discharge.  Musculoskeletal: Positive for gait problem and myalgias. Negative for arthralgias and joint swelling.  Skin: Negative for color change and rash.  Neurological: Negative for dizziness, tremors, light-headedness and headaches.  Hematological: Negative for adenopathy. Does not bruise/bleed easily.  Psychiatric/Behavioral: Positive for depression. Negative for dysphoric mood and sleep disturbance. The patient is not nervous/anxious.     Social History  Social History  . Marital status: Unknown    Spouse name: N/A  . Number of children: 3  . Years of education: 12   Occupational History  . [employer]     About 8-10 per week   Social History Main Topics  . Smoking  status: Never Smoker  . Smokeless tobacco: Never Used  . Alcohol use 0.6 oz/week    1 Cans of beer per week     Comment:    . Drug use: No  . Sexual activity: Not on file   Other Topics Concern  . Not on file   Social History Narrative   Lives at home alone   Right-handed   Drinks 2 cups of coffee per day    Patient Active Problem List   Diagnosis Date Noted  . Mild single current episode of major depressive disorder ([place]) 05/22/2016  . OAB (overactive bladder) 05/22/2016  . Cataract of right eye 01/19/2016  . Eversion of the eyelid 01/19/2016  . Neoplasm of uncertain behavior of skin 01/19/2016  . Thalamic infarction ([place]) 11/16/2015  . Aneurysm, cerebral, nonruptured 01/04/2015  . Essential (primary) hypertension 01/04/2015  . HLD (hyperlipidemia) 01/04/2015  . Idiopathic insomnia 01/04/2015  . Special screening for malignant neoplasms, colon 01/04/2015    Past Surgical History:  Procedure Laterality Date  . ABDOMINAL HERNIA REPAIR    . CATARACT EXTRACTION W/PHACO Right 06/24/2016   Procedure: CATARACT EXTRACTION PHACO AND INTRAOCULAR LENS PLACEMENT (IOC);  Surgeon: Ronnell Freshwater, MD;  Location: [place];  Service: Ophthalmology;  Laterality: Right;  RIGHT  . CHOLECYSTECTOMY    . IR GENERIC HISTORICAL  01/12/2016   IR ANGIO INTRA EXTRACRAN SEL INTERNAL CAROTID BILAT MOD SED 01/12/2016 Consuella Lose, MD [facility]  . IR GENERIC HISTORICAL  01/12/2016   IR ANGIO VERTEBRAL SEL VERTEBRAL BILAT MOD SED 01/12/2016 Consuella Lose, MD [facility]  . IR GENERIC HISTORICAL  01/12/2016   IR 3D INDEPENDENT WKST 01/12/2016 Consuella Lose, MD [facility]  . REPLACEMENT TOTAL KNEE Bilateral   . TOTAL ABDOMINAL HYSTERECTOMY      Her family history includes Breast cancer (age of onset: 49) in her daughter; Breast cancer (age of onset: 56) in her mother; Cancer in her brother, brother, daughter, and sister; Dementia in her sister; Heart attack in her father.      Previous Medications   ASPIRIN 325 MG TABLET    Take 1 tablet (325 mg total) by mouth daily.   ATORVASTATIN (LIPITOR) 20 MG TABLET    TAKE 1 TABLET(20 MG) BY MOUTH DAILY AT 6 PM   LATANOPROST (XALATAN) 0.005 % OPHTHALMIC SOLUTION    1 drop at bedtime.   LISINOPRIL-HYDROCHLOROTHIAZIDE (PRINZIDE,ZESTORETIC) 10-12.5 MG TABLET    TAKE ONE TABELT BY MOUTH DAILY   TRAZODONE (DESYREL) 50 MG TABLET    Take 50 mg by mouth at bedtime.    Patient Care Team: Glean Hess, MD as PCP - General (Family Medicine) Consuella Lose, MD as Consulting Physician (Neurosurgery)      Objective:   Vitals: BP 132/84   Pulse 75   Ht 5' 3.5" (1.613 m)   Wt 186 lb (84.4 kg)   SpO2 97%   BMI 32.43 kg/m   Physical Exam  Constitutional: She is oriented to person, place, and time. She appears well-developed. No distress.  HENT:  Head: Normocephalic and atraumatic.  Eyes: Pupils are equal, round, and reactive to light.  Neck: Normal range of motion. Carotid bruit is not present.  No thyromegaly present.  Cardiovascular: Normal rate, regular rhythm and normal heart sounds.  Exam reveals no friction rub.   No murmur heard. Pulmonary/Chest: Effort normal and breath sounds normal. No respiratory distress. She has no wheezes. She has no rales.  Breast exam declined  Abdominal: Soft. Bowel sounds are normal. There is no tenderness. There is no rebound.  Musculoskeletal:       Right knee: She exhibits decreased range of motion.       Left knee: She exhibits decreased range of motion.  Neurological: She is alert and oriented to person, place, and time. She has normal strength and normal reflexes. No sensory deficit. Gait (required cane for stability) abnormal.  Skin: Skin is warm, dry and intact. No rash noted.  Psychiatric: She has a normal mood and affect. Her behavior is normal. Thought content normal.  Nursing note and vitals reviewed.   Activities of Daily Living In your present state of health,  do you have any difficulty performing the following activities: 03/27/2017 06/24/2016  Hearing? N N  Vision? N N  Difficulty concentrating or making decisions? N N  Walking or climbing stairs? N N  Dressing or bathing? N N  Doing errands, shopping? N -  Preparing Food and eating ? N -  Using the Toilet? N -  In the past six months, have you accidently leaked urine? Y -  Comment sometimes at night.  -  Do you have problems with loss of bowel control? N -  Managing your Medications? N -  Managing your Finances? N -  Housekeeping or managing your Housekeeping? N -  Some recent data might be hidden    Fall Risk Assessment Fall Risk  03/27/2017 05/22/2016 01/19/2016 06/06/2015  Falls in the past year? No No Yes No    Depression Screen PHQ 2/9 Scores 03/27/2017 05/22/2016 01/19/2016 06/06/2015  PHQ - 2 Score 2 4 0 0  PHQ- 9 Score 3 9 - -   6CIT Screen 03/27/2017  What Year? 0 points  What month? 0 points  What time? 0 points  Count back from 20 0 points  Months in reverse 0 points  Repeat phrase 0 points  Total Score 0    Medicare Annual Wellness Visit Summary:  Reviewed patient's Family Medical History Reviewed and updated list of patient's medical providers Assessment of cognitive impairment was done Assessed patient's functional ability Established a written schedule for health screening [place] Completed and Reviewed  Exercise Activities and Dietary recommendations Goals    None      Immunization History  Administered Date(s) Administered  . Influenza,inj,Quad PF,6+ Mos 06/06/2015, 05/22/2016  . Pneumococcal Conjugate-13 12/22/2014  . Pneumococcal Polysaccharide-23 08/06/2011    Health Maintenance  Topic Date Due  . TETANUS/TDAP  01/10/1960  . DEXA SCAN  01/09/2006  . INFLUENZA VACCINE  05/05/2017 (Originally 03/05/2017)  . PNA vac Low Risk Adult  Completed    Discussed health benefits of physical activity, and encouraged her to engage in  regular exercise appropriate for her age and condition.    ------------------------------------------------------------------------------------------------------------  Assessment & Plan:   1. Medicare annual wellness visit, subsequent Measures satisfied Needs to consider more regular physical activity and healthy diet Rx for Shingrix is given  2. Essential (primary) hypertension controlled - CBC with Differential/Platelet - Comprehensive metabolic panel - TSH  3. Mixed hyperlipidemia Continue statin therapy - atorvastatin (LIPITOR) 20 MG tablet; Take 1 tablet (20 mg total) by mouth daily at 6 PM.  Dispense:  90 tablet; Refill: 3 - Lipid panel  4. Mild single current episode of major depressive disorder ([place]) Much improved, no longer on medication  5. Thalamic infarction ([place]) Appears stable but does have some gait issues - atorvastatin (LIPITOR) 20 MG tablet; Take 1 tablet (20 mg total) by mouth daily at 6 PM.  Dispense: 90 tablet; Refill: 3  6. Gait instability Needs PT for strengthening and balance training - Ambulatory referral to Physical Therapy  7. Muscle fasciculation Check electrolytes - Magnesium  8. Gastroesophageal reflux disease, esophagitis presence not specified Recommend elevate HOB on bricks Can also take zantac 150 mg otc if needed   Meds ordered this encounter  Medications  . atorvastatin (LIPITOR) 20 MG tablet    Sig: Take 1 tablet (20 mg total) by mouth daily at 6 PM.    Dispense:  90 tablet    Refill:  [place], MD [facility]  03/27/2017

## 2017-03-28 LAB — CBC WITH DIFFERENTIAL/PLATELET
Basophils Absolute: 0.1 10*3/uL (ref 0.0–0.2)
Basos: 1 %
EOS (ABSOLUTE): 0.2 10*3/uL (ref 0.0–0.4)
Eos: 2 %
HEMATOCRIT: 43.3 % (ref 34.0–46.6)
HEMOGLOBIN: 14.2 g/dL (ref 11.1–15.9)
Immature Grans (Abs): 0 10*3/uL (ref 0.0–0.1)
Immature Granulocytes: 0 %
LYMPHS ABS: 2.4 10*3/uL (ref 0.7–3.1)
Lymphs: 35 %
MCH: 31.4 pg (ref 26.6–33.0)
MCHC: 32.8 g/dL (ref 31.5–35.7)
MCV: 96 fL (ref 79–97)
Monocytes Absolute: 0.8 10*3/uL (ref 0.1–0.9)
Monocytes: 11 %
Neutrophils Absolute: 3.5 10*3/uL (ref 1.4–7.0)
Neutrophils: 51 %
Platelets: 282 10*3/uL (ref 150–379)
RBC: 4.52 x10E6/uL (ref 3.77–5.28)
RDW: 14.2 % (ref 12.3–15.4)
WBC: 6.9 10*3/uL (ref 3.4–10.8)

## 2017-03-28 LAB — LIPID PANEL
CHOL/HDL RATIO: 3.9 ratio (ref 0.0–4.4)
CHOLESTEROL TOTAL: 255 mg/dL — AB (ref 100–199)
HDL: 65 mg/dL (ref >39)
LDL CALC: 168 mg/dL — AB (ref 0–99)
Triglycerides: 111 mg/dL (ref 0–149)
VLDL Cholesterol Cal: 22 mg/dL (ref 5–40)

## 2017-03-28 LAB — COMPREHENSIVE METABOLIC PANEL
ALBUMIN: 4.8 g/dL (ref 3.5–4.8)
ALK PHOS: 54 IU/L (ref 39–117)
ALT: 24 IU/L (ref 0–32)
AST: 22 IU/L (ref 0–40)
Albumin/Globulin Ratio: 2.1 (ref 1.2–2.2)
BUN / CREAT RATIO: 26 (ref 12–28)
BUN: 19 mg/dL (ref 8–27)
Bilirubin Total: 0.3 mg/dL (ref 0.0–1.2)
CO2: 23 mmol/L (ref 20–29)
CREATININE: 0.73 mg/dL (ref 0.57–1.00)
Calcium: 10.5 mg/dL — ABNORMAL HIGH (ref 8.7–10.3)
Chloride: 101 mmol/L (ref 96–106)
GFR calc Af Amer: 93 mL/min/{1.73_m2} (ref >59)
GFR, EST NON AFRICAN AMERICAN: 80 mL/min/{1.73_m2} (ref >59)
GLOBULIN, TOTAL: 2.3 g/dL (ref 1.5–4.5)
Glucose: 98 mg/dL (ref 65–99)
Potassium: 4.4 mmol/L (ref 3.5–5.2)
SODIUM: 139 mmol/L (ref 134–144)
Total Protein: 7.1 g/dL (ref 6.0–8.5)

## 2017-03-28 LAB — MAGNESIUM: MAGNESIUM: 2.1 mg/dL (ref 1.6–2.3)

## 2017-03-28 LAB — TSH: TSH: 1.67 u[IU]/mL (ref 0.450–4.500)

## 2017-04-01 ENCOUNTER — Ambulatory Visit: Payer: Medicare Other | Attending: Internal Medicine | Admitting: Physical Therapy

## 2017-04-01 ENCOUNTER — Encounter: Payer: Self-pay | Admitting: Physical Therapy

## 2017-04-01 DIAGNOSIS — R262 Difficulty in walking, not elsewhere classified: Secondary | ICD-10-CM | POA: Diagnosis not present

## 2017-04-01 DIAGNOSIS — Z9181 History of falling: Secondary | ICD-10-CM | POA: Diagnosis not present

## 2017-04-01 DIAGNOSIS — M6281 Muscle weakness (generalized): Secondary | ICD-10-CM | POA: Diagnosis not present

## 2017-04-01 NOTE — Therapy (Addendum)
[facility] [facility] [address] [place], [place], [address] Phone: [phone]   Fax:  [phone]  Physical Therapy Evaluation  Patient Details  Name: Yaileen Hofferber MRN: [id number] Date of Birth: 1941/07/22 Referring Provider: Dr. Army Melia  Encounter Date: 04/01/2017    PT End of Session - 04/01/17 1257    Visit Number 1   Number of Visits 8   Date for PT Re-Evaluation 04/29/17   Authorization - Visit Number 1   Authorization - Number of Visits 10   PT Start Time 1761   PT Stop Time 1401   PT Time Calculation (min) 64 min   Equipment Utilized During Treatment Gait belt   Activity Tolerance Patient tolerated treatment well   Behavior During Therapy [facility] for tasks assessed/performed      Past Medical History:  Diagnosis Date  . Brain aneurysm   . Cataract    right  . CVA (cerebral infarction)    no deficits  . Hypertension     Past Surgical History:  Procedure Laterality Date  . ABDOMINAL HERNIA REPAIR    . CATARACT EXTRACTION W/PHACO Right 06/24/2016   Procedure: CATARACT EXTRACTION PHACO AND INTRAOCULAR LENS PLACEMENT (IOC);  Surgeon: Ronnell Freshwater, MD;  Location: [place];  Service: Ophthalmology;  Laterality: Right;  RIGHT  . CHOLECYSTECTOMY    . IR GENERIC HISTORICAL  01/12/2016   IR ANGIO INTRA EXTRACRAN SEL INTERNAL CAROTID BILAT MOD SED 01/12/2016 Consuella Lose, MD [facility]  . IR GENERIC HISTORICAL  01/12/2016   IR ANGIO VERTEBRAL SEL VERTEBRAL BILAT MOD SED 01/12/2016 Consuella Lose, MD [facility]  . IR GENERIC HISTORICAL  01/12/2016   IR 3D INDEPENDENT WKST 01/12/2016 Consuella Lose, MD [facility]  . REPLACEMENT TOTAL KNEE Bilateral   . TOTAL ABDOMINAL HYSTERECTOMY      There were no vitals filed for this visit.    Pt. reports recent decline in walking/balance with daily tasks.  Pt. states she hasn't been compliant with past HEP.  Pt. known to PT clinic.        B LE AROM WFL.   Reviewed HEP No history of pain.   Berg balance test:  2/56      Pt. is a 76 y/o female s/p CVA on 11/16/15 with h/o brain aneurysm.  Pt. has h/o B TKA and limited knee flexion (90 deg.).  Pt. known to PT after previous evaluation/treatment for gait and balance issues.  Pt. reports a more recent decline with B LE muscle strength/ gait/ balance.  Pt. presents with L LE numbness and decrease B knee AROM: L (0 to 95 deg.), R (0 to 98 deg.).  B LE muscle weakness: B hip flexion 4/5, B hip IR/ER 4/5, B quads, hamstring, DF 4+/5 MMT.  Pt. ambulates with slight L antalgic gait pattern with limited B hip flexion/ heel strike with no assistive device.  Berg balance test: 48/56.  LEFS: 48 out of 80.  Pt. will benefit from skilled PT services to increase B LE muscle strength to improve balance/ decrease fall risk.        PT Long Term Goals - 04/02/17 0707      PT LONG TERM GOAL #1   Title Pt. I with HEP to increase B LE muscle strength 1/2 muscle grade to improve standing/ walking.     Baseline  B LE muscle weakness: B hip flexion 4+/5, B hip IR/ER 4+/5, R quad. 5/5, L quad 4+/5, R hamstring 5/5, L  hamstring 4/5, DF 5/5 MMT.    Time 4   Period Weeks   Status New   Target Date 04/29/17     PT LONG TERM GOAL #2   Title Pt. will increase LEFS to >55 out of 80 to improve overall functional mobility.    Baseline LEFS: 48 out of 80 on 8/28   Time 4   Period Weeks   Status New   Target Date 04/29/17     PT LONG TERM GOAL #3   Title Pt. will increase Berg balance test to >52/56 to decrease fall risk/ improve walking.    Baseline Berg: 48/56   Time 4   Period Weeks   Status New   Target Date 04/29/17     PT LONG TERM GOAL #4   Title Pt. able to ambulate with least assistive device and normalized gait pattern to improve walking/ decrease fall risk.    Baseline L antalgic gait.  Benefits from [facility]   Time 4   Period Weeks   Status New   Target Date 04/29/17       Patient will benefit from  skilled therapeutic intervention in order to improve the following deficits and impairments:  Abnormal gait, Decreased endurance, Decreased activity tolerance, Decreased strength, Difficulty walking, Decreased balance, Decreased mobility, Decreased coordination, Postural dysfunction, Improper body mechanics, Decreased range of motion  Visit Diagnosis: Difficulty in walking, not elsewhere classified  Muscle weakness (generalized)  Risk for falls     Problem List Patient Active Problem List   Diagnosis Date Noted  . Gait instability 03/27/2017  . Muscle fasciculation 03/27/2017  . Gastroesophageal reflux disease 03/27/2017  . Mild single current episode of major depressive disorder ([place]) 05/22/2016  . OAB (overactive bladder) 05/22/2016  . Cataract of right eye 01/19/2016  . Eversion of the eyelid 01/19/2016  . Neoplasm of uncertain behavior of skin 01/19/2016  . Thalamic infarction ([place]) 11/16/2015  . Aneurysm, cerebral, nonruptured 01/04/2015  . Essential (primary) hypertension 01/04/2015  . HLD (hyperlipidemia) 01/04/2015  . Idiopathic insomnia 01/04/2015  . Special screening for malignant neoplasms, colon 01/04/2015   Pura Spice, PT, DPT # [phone] 04/08/2017, 7:09 AM  [facility] [address] [place], [place], [address] Phone: [phone]   Fax:  [phone]  Name: Kaleeya Hancock MRN: [id number] Date of Birth: 11-26-1940

## 2017-04-08 ENCOUNTER — Encounter: Payer: Self-pay | Admitting: Physical Therapy

## 2017-04-08 ENCOUNTER — Ambulatory Visit: Payer: Medicare Other | Attending: Internal Medicine | Admitting: Physical Therapy

## 2017-04-08 DIAGNOSIS — M6281 Muscle weakness (generalized): Secondary | ICD-10-CM | POA: Diagnosis not present

## 2017-04-08 DIAGNOSIS — Z9181 History of falling: Secondary | ICD-10-CM

## 2017-04-08 DIAGNOSIS — R262 Difficulty in walking, not elsewhere classified: Secondary | ICD-10-CM

## 2017-04-08 NOTE — Addendum Note (Signed)
Addended by: Pura Spice on: 04/08/2017 07:13 AM   Modules accepted: Orders

## 2017-04-08 NOTE — Therapy (Signed)
[facility] [address] [place], [place], [address] Phone: [phone]   Fax:  [phone]  Physical Therapy Treatment  Patient Details  Name: Cleta Heatley MRN: [id number] Date of Birth: 01/22/1941 Referring Provider: Dr. Army Melia  Encounter Date: 04/08/2017      PT End of Session - 04/08/17 1740    Visit Number 2   Number of Visits 8   Date for PT Re-Evaluation 04/29/17   Authorization - Visit Number 2   Authorization - Number of Visits 10   PT Start Time 0900   PT Stop Time 0952   PT Time Calculation (min) 52 min   Equipment Utilized During Treatment Gait belt   Activity Tolerance Patient tolerated treatment well   Behavior During Therapy [facility] for tasks assessed/performed      Past Medical History:  Diagnosis Date  . Brain aneurysm   . Cataract    right  . CVA (cerebral infarction)    no deficits  . Hypertension     Past Surgical History:  Procedure Laterality Date  . ABDOMINAL HERNIA REPAIR    . CATARACT EXTRACTION W/PHACO Right 06/24/2016   Procedure: CATARACT EXTRACTION PHACO AND INTRAOCULAR LENS PLACEMENT (IOC);  Surgeon: Ronnell Freshwater, MD;  Location: [place];  Service: Ophthalmology;  Laterality: Right;  RIGHT  . CHOLECYSTECTOMY    . IR GENERIC HISTORICAL  01/12/2016   IR ANGIO INTRA EXTRACRAN SEL INTERNAL CAROTID BILAT MOD SED 01/12/2016 Consuella Lose, MD [facility]  . IR GENERIC HISTORICAL  01/12/2016   IR ANGIO VERTEBRAL SEL VERTEBRAL BILAT MOD SED 01/12/2016 Consuella Lose, MD [facility]  . IR GENERIC HISTORICAL  01/12/2016   IR 3D INDEPENDENT WKST 01/12/2016 Consuella Lose, MD [facility]  . REPLACEMENT TOTAL KNEE Bilateral   . TOTAL ABDOMINAL HYSTERECTOMY      There were no vitals filed for this visit.      Subjective Assessment - 04/08/17 0903    Subjective Pt. states she is doing well.  No new complaints.  Pt. reports she has the chance to go to [employer] for an aquatic ex. class.  PT really recommends pt. start to participate with more active lifestyle.     Limitations Walking;House hold activities;Standing   Patient Stated Goals Improve walking/ balance   Currently in Pain? No/denies       Therex.:  Nustep L5 10 min. B UE/LE (warm-up/no charge). Standing hip flexion/ walking hip flexion with SPC use/ Standing hip abd./ knee flexion/ heel raises 20x. Reviewed HEP.  Neuro. Mm.:  Standing step touches (heel/ toe) with light to no UE assist. BOSU step ups/ downs (requires light UE touch on //-bars to maintain balance).   Sit to stands 10x with no UE assist.   Walking in clinic/ outside without UE assist on grassy terrain/ up and down curbs.  No LOB. Resisted gait pattern 6x all 4-planes (min. To no UE assist).        PT Long Term Goals - 04/02/17 0707      PT LONG TERM GOAL #1   Title Pt. I with HEP to increase B LE muscle strength 1/2 muscle grade to improve standing/ walking.     Baseline  B LE muscle weakness: B hip flexion 4+/5, B hip IR/ER 4+/5, R quad. 5/5, L quad 4+/5, R hamstring 5/5, L hamstring 4/5, DF 5/5 MMT.    Time 4   Period Weeks   Status New   Target Date  04/29/17     PT LONG TERM GOAL #2   Title Pt. will increase LEFS to >55 out of 80 to improve overall functional mobility.    Baseline LEFS: 48 out of 80 on 8/28   Time 4   Period Weeks   Status New   Target Date 04/29/17     PT LONG TERM GOAL #3   Title Pt. will increase Berg balance test to >52/56 to decrease fall risk/ improve walking.    Baseline Berg: 48/56   Time 4   Period Weeks   Status New   Target Date 04/29/17     PT LONG TERM GOAL #4   Title Pt. able to ambulate with least assistive device and normalized gait pattern to improve walking/ decrease fall risk.    Baseline L antalgic gait.  Benefits from [facility]   Time 4   Period Weeks   Status New   Target Date 04/29/17               Plan - 04/08/17 1741    Clinical  Impression Statement Pt. ambulates with slight L antalgic gait pattern with improved arm swing while using SPC on R side.  Pt. educated on importance of staying active with a daily walking program/ gym based ex. program by herself or [employer].  Pt. reports a fear and confidence issue with going to a gym.  Light UE touch required on //-bars during BOSU/ higher level balance tasks.  PT discussed/ demonstrated proper fall recovery.     Clinical Presentation Stable   Clinical Decision Making Moderate   Rehab Potential Good   PT Frequency 2x / week   PT Duration 4 weeks   PT Treatment/Interventions ADLs/Self Care Home Management;Aquatic Therapy;Functional mobility training;Stair training;Gait training;Therapeutic activities;Therapeutic exercise;Balance training;Patient/family education;Neuromuscular re-education   PT Next Visit Plan Progress HEP.  Discuss aquatic ex. class at [facility].     PT Home Exercise Plan See handouts.       Patient will benefit from skilled therapeutic intervention in order to improve the following deficits and impairments:  Abnormal gait, Decreased endurance, Decreased activity tolerance, Decreased strength, Difficulty walking, Decreased balance, Decreased mobility, Decreased coordination, Postural dysfunction, Improper body mechanics, Decreased range of motion  Visit Diagnosis: Difficulty in walking, not elsewhere classified  Muscle weakness (generalized)  Risk for falls     Problem List Patient Active Problem List   Diagnosis Date Noted  . Gait instability 03/27/2017  . Muscle fasciculation 03/27/2017  . Gastroesophageal reflux disease 03/27/2017  . Mild single current episode of major depressive disorder ([place]) 05/22/2016  . OAB (overactive bladder) 05/22/2016  . Cataract of right eye 01/19/2016  . Eversion of the eyelid 01/19/2016  . Neoplasm of uncertain behavior of skin 01/19/2016  . Thalamic infarction ([place]) 11/16/2015  . Aneurysm, cerebral,  nonruptured 01/04/2015  . Essential (primary) hypertension 01/04/2015  . HLD (hyperlipidemia) 01/04/2015  . Idiopathic insomnia 01/04/2015  . Special screening for malignant neoplasms, colon 01/04/2015   Pura Spice, PT, DPT # [phone] 04/08/2017, 5:56 PM  [facility] [address] [place], [place], [address] Phone: [phone]   Fax:  [phone]  Name: Josilynn Losh MRN: [id number] Date of Birth: 1940/08/07

## 2017-04-10 ENCOUNTER — Ambulatory Visit: Payer: Medicare Other | Admitting: Physical Therapy

## 2017-04-10 DIAGNOSIS — M6281 Muscle weakness (generalized): Secondary | ICD-10-CM

## 2017-04-10 DIAGNOSIS — R262 Difficulty in walking, not elsewhere classified: Secondary | ICD-10-CM

## 2017-04-10 DIAGNOSIS — Z9181 History of falling: Secondary | ICD-10-CM

## 2017-04-11 ENCOUNTER — Other Ambulatory Visit: Payer: Self-pay | Admitting: Internal Medicine

## 2017-04-13 NOTE — Therapy (Signed)
[facility] [address] [place], [place], [address] Phone: [phone]   Fax:  [phone]  Physical Therapy Treatment  Patient Details  Name: Bonnie Jackson MRN: [id number] Date of Birth: 1940/10/12 Referring Provider: Dr. Army Melia  Encounter Date: 04/10/2017  3 of 8 treatments.  End of cert: 8/33/82  Past Medical History:  Diagnosis Date  . Brain aneurysm   . Cataract    right  . CVA (cerebral infarction)    no deficits  . Hypertension     Past Surgical History:  Procedure Laterality Date  . ABDOMINAL HERNIA REPAIR    . CATARACT EXTRACTION W/PHACO Right 06/24/2016   Procedure: CATARACT EXTRACTION PHACO AND INTRAOCULAR LENS PLACEMENT (IOC);  Surgeon: Ronnell Freshwater, MD;  Location: [place];  Service: Ophthalmology;  Laterality: Right;  RIGHT  . CHOLECYSTECTOMY    . IR GENERIC HISTORICAL  01/12/2016   IR ANGIO INTRA EXTRACRAN SEL INTERNAL CAROTID BILAT MOD SED 01/12/2016 Consuella Lose, MD [facility]  . IR GENERIC HISTORICAL  01/12/2016   IR ANGIO VERTEBRAL SEL VERTEBRAL BILAT MOD SED 01/12/2016 Consuella Lose, MD [facility]  . IR GENERIC HISTORICAL  01/12/2016   IR 3D INDEPENDENT WKST 01/12/2016 Consuella Lose, MD [facility]  . REPLACEMENT TOTAL KNEE Bilateral   . TOTAL ABDOMINAL HYSTERECTOMY      There were no vitals filed for this visit.    Pt. went to aquatic ex. program with [employer] at [employer].  Pt. really enjoyed the ex. program.        Therex.:  Scifit  L4 8 min. B UE/LE (warm-up/no charge). Standing hip flexion/ walking hip flexion with SPC use/ Standing hip abd./ knee flexion/ heel raises 20x. Step ups/down in //-bars.    Neuro. Mm.:  Standing step touches (heel/ toe) with light to no UE assist. Sit to stands 10x with no UE assist.   Walking in clinic/ outside without UE assist on grassy terrain/ up and down curbs.  No LOB. Resisted gait pattern 6x all  4-planes (min. To no UE assist). Alt. UE/LE in //-bars with progression into gym (cuing to correct).   Tandem stance/ walking with light UE assist. Braiding in //-bars with posture correction/ mirror feedback.     Pt. is fearful of falling due to LE muscle weakness/ confidence.  Pt. did really well with ther.ex. program and balance tasks in clinic today.  Cuing to increase BOS and posture/head position t/o tx. session.  PT discussed the benefits of aquatic ex. and explained ex.         PT Long Term Goals - 04/02/17 0707      PT LONG TERM GOAL #1   Title Pt. I with HEP to increase B LE muscle strength 1/2 muscle grade to improve standing/ walking.     Baseline  B LE muscle weakness: B hip flexion 4+/5, B hip IR/ER 4+/5, R quad. 5/5, L quad 4+/5, R hamstring 5/5, L hamstring 4/5, DF 5/5 MMT.    Time 4   Period Weeks   Status New   Target Date 04/29/17     PT LONG TERM GOAL #2   Title Pt. will increase LEFS to >55 out of 80 to improve overall functional mobility.    Baseline LEFS: 48 out of 80 on 8/28   Time 4   Period Weeks   Status New   Target Date 04/29/17     PT LONG TERM GOAL #3   Title Pt.  will increase Berg balance test to >52/56 to decrease fall risk/ improve walking.    Baseline Berg: 48/56   Time 4   Period Weeks   Status New   Target Date 04/29/17     PT LONG TERM GOAL #4   Title Pt. able to ambulate with least assistive device and normalized gait pattern to improve walking/ decrease fall risk.    Baseline L antalgic gait.  Benefits from [facility]   Time 4   Period Weeks   Status New   Target Date 04/29/17             Patient will benefit from skilled therapeutic intervention in order to improve the following deficits and impairments:  Abnormal gait, Decreased endurance, Decreased activity tolerance, Decreased strength, Difficulty walking, Decreased balance, Decreased mobility, Decreased coordination, Postural dysfunction, Improper body mechanics, Decreased  range of motion  Visit Diagnosis: Difficulty in walking, not elsewhere classified  Muscle weakness (generalized)  Risk for falls     Problem List Patient Active Problem List   Diagnosis Date Noted  . Gait instability 03/27/2017  . Muscle fasciculation 03/27/2017  . Gastroesophageal reflux disease 03/27/2017  . Mild single current episode of major depressive disorder ([place]) 05/22/2016  . OAB (overactive bladder) 05/22/2016  . Cataract of right eye 01/19/2016  . Eversion of the eyelid 01/19/2016  . Neoplasm of uncertain behavior of skin 01/19/2016  . Thalamic infarction ([place]) 11/16/2015  . Aneurysm, cerebral, nonruptured 01/04/2015  . Essential (primary) hypertension 01/04/2015  . HLD (hyperlipidemia) 01/04/2015  . Idiopathic insomnia 01/04/2015  . Special screening for malignant neoplasms, colon 01/04/2015   Pura Spice, PT, DPT # [phone] 04/13/2017, 6:56 PM  [facility] [address] [place], [place], [address] Phone: [phone]   Fax:  [phone]  Name: Bonnie Jackson MRN: [id number] Date of Birth: [birth year]

## 2017-04-15 ENCOUNTER — Ambulatory Visit: Payer: Medicare Other | Admitting: Physical Therapy

## 2017-04-15 ENCOUNTER — Encounter: Payer: Self-pay | Admitting: Physical Therapy

## 2017-04-15 DIAGNOSIS — M6281 Muscle weakness (generalized): Secondary | ICD-10-CM

## 2017-04-15 DIAGNOSIS — R262 Difficulty in walking, not elsewhere classified: Secondary | ICD-10-CM

## 2017-04-15 DIAGNOSIS — Z9181 History of falling: Secondary | ICD-10-CM

## 2017-04-15 NOTE — Therapy (Signed)
[facility] [address] [place], [place], [address] Phone: [phone]   Fax:  [phone]  Physical Therapy Treatment  Patient Details  Name: Bonnie Jackson MRN: [id number] Date of Birth: [birth year] Referring Provider: Dr. Army Melia  Encounter Date: 04/15/2017      PT End of Session - 04/15/17 1002    Visit Number 4   Number of Visits 8   Date for PT Re-Evaluation 04/29/17   Authorization - Visit Number 4   Authorization - Number of Visits 10   PT Start Time 3299   PT Stop Time 1051   PT Time Calculation (min) 49 min   Activity Tolerance Patient tolerated treatment well   Behavior During Therapy [facility] for tasks assessed/performed      Past Medical History:  Diagnosis Date  . Brain aneurysm   . Cataract    right  . CVA (cerebral infarction)    no deficits  . Hypertension     Past Surgical History:  Procedure Laterality Date  . ABDOMINAL HERNIA REPAIR    . CATARACT EXTRACTION W/PHACO Right 06/24/2016   Procedure: CATARACT EXTRACTION PHACO AND INTRAOCULAR LENS PLACEMENT (IOC);  Surgeon: Ronnell Freshwater, MD;  Location: [place];  Service: Ophthalmology;  Laterality: Right;  RIGHT  . CHOLECYSTECTOMY    . IR GENERIC HISTORICAL  01/12/2016   IR ANGIO INTRA EXTRACRAN SEL INTERNAL CAROTID BILAT MOD SED 01/12/2016 Consuella Lose, MD [facility]INTER[facility]  . IR GENERIC HISTORICAL  01/12/2016   IR ANGIO VERTEBRAL SEL VERTEBRAL BILAT MOD SED 01/12/2016 Consuella Lose, MD [facility]  . IR GENERIC HISTORICAL  01/12/2016   IR 3D INDEPENDENT WKST 01/12/2016 Consuella Lose, MD [facility]INTER[facility]  . REPLACEMENT TOTAL KNEE Bilateral   . TOTAL ABDOMINAL HYSTERECTOMY      There were no vitals filed for this visit.      Subjective Assessment - 04/15/17 0958    Subjective Pt. reports being sore in R shoulder/ B LE.  Pt. c/o 1-2/10 R shoulder/ B LE pain at this time.  Pt. went to aquatic ex. program at [facility] yesterday.     Limitations Walking;House hold activities;Standing   Patient Stated Goals Improve walking/ balance   Currently in Pain? Yes   Pain Score 1    Pain Location Shoulder   Pain Orientation Right   Pain Descriptors / Indicators Aching       Therex.:  Nustep L6 10 min. B UE/LE (warm-up/no charge). Supine/ seated/ standing DF and PF (unable to DF in standing).     Neuro. Mm.:  Walking in clinic working on increase cadence/ step pattern/ heel strike and BOS.   Resisted gait pattern 6x all 4-planes (min. To no UE assist). Supine Alt. UE/LE to work on coordination/ sequencing.  Tandem stance/ walking with light UE assist.  Airex step ups/ wt. shifting.        PT Long Term Goals - 04/02/17 0707      PT LONG TERM GOAL #1   Title Pt. I with HEP to increase B LE muscle strength 1/2 muscle grade to improve standing/ walking.     Baseline  B LE muscle weakness: B hip flexion 4+/5, B hip IR/ER 4+/5, R quad. 5/5, L quad 4+/5, R hamstring 5/5, L hamstring 4/5, DF 5/5 MMT.    Time 4   Period Weeks   Status New   Target Date 04/29/17     PT LONG TERM GOAL #2   Title Pt. will increase  LEFS to >55 out of 80 to improve overall functional mobility.    Baseline LEFS: 48 out of 80 on 8/28   Time 4   Period Weeks   Status New   Target Date 04/29/17     PT LONG TERM GOAL #3   Title Pt. will increase Berg balance test to >52/56 to decrease fall risk/ improve walking.    Baseline Berg: 48/56   Time 4   Period Weeks   Status New   Target Date 04/29/17     PT LONG TERM GOAL #4   Title Pt. able to ambulate with least assistive device and normalized gait pattern to improve walking/ decrease fall risk.    Baseline L antalgic gait.  Benefits from [facility]   Time 4   Period Weeks   Status New   Target Date 04/29/17            Plan - 04/15/17 1004    Clinical Impression Statement R UE/B LE muscle soreness noted during tx. session and pt. educated on DOMS.  Consistent gait pattern with  cuing for head position/ posture/ heel strike.  Pt. will continue with ex. program and instructed to stretch before/after. ex. to prevent soreness.  Pt. unable to DF/ raise of toes in standing posture.  Increase DF in supine and seated positions.  No limitations with PF in standing posture.  PT focus on B ankle/ LE stability ex. program during standing ther.ex./ balance tasks.     Clinical Presentation Stable   Clinical Decision Making Moderate   Rehab Potential Good   PT Frequency 2x / week   PT Duration 4 weeks   PT Treatment/Interventions ADLs/Self Care Home Management;Aquatic Therapy;Functional mobility training;Stair training;Gait training;Therapeutic activities;Therapeutic exercise;Balance training;Patient/family education;Neuromuscular re-education   PT Next Visit Plan Increase B LE muscle strength/ progress ex. program to more gym based/ aquatic ex.     PT Home Exercise Plan See handouts.    Consulted and Agree with Plan of Care Patient      Patient will benefit from skilled therapeutic intervention in order to improve the following deficits and impairments:  Abnormal gait, Decreased endurance, Decreased activity tolerance, Decreased strength, Difficulty walking, Decreased balance, Decreased mobility, Decreased coordination, Postural dysfunction, Improper body mechanics, Decreased range of motion  Visit Diagnosis: Difficulty in walking, not elsewhere classified  Muscle weakness (generalized)  Risk for falls     Problem List Patient Active Problem List   Diagnosis Date Noted  . Gait instability 03/27/2017  . Muscle fasciculation 03/27/2017  . Gastroesophageal reflux disease 03/27/2017  . Mild single current episode of major depressive disorder ([place]) 05/22/2016  . OAB (overactive bladder) 05/22/2016  . Cataract of right eye 01/19/2016  . Eversion of the eyelid 01/19/2016  . Neoplasm of uncertain behavior of skin 01/19/2016  . Thalamic infarction ([place]) 11/16/2015  .  Aneurysm, cerebral, nonruptured 01/04/2015  . Essential (primary) hypertension 01/04/2015  . HLD (hyperlipidemia) 01/04/2015  . Idiopathic insomnia 01/04/2015  . Special screening for malignant neoplasms, colon 01/04/2015   Pura Spice, PT, DPT # [phone] 04/15/2017, 1:08 PM   [facility] [address] [place], [place], [address] Phone: [phone]   Fax:  [phone]  Name: Bonnie Jackson MRN: [id number] Date of Birth: 1940-10-10

## 2017-04-17 ENCOUNTER — Encounter: Payer: Self-pay | Admitting: Physical Therapy

## 2017-04-17 ENCOUNTER — Ambulatory Visit: Payer: Medicare Other | Admitting: Physical Therapy

## 2017-04-17 DIAGNOSIS — R262 Difficulty in walking, not elsewhere classified: Secondary | ICD-10-CM

## 2017-04-17 DIAGNOSIS — Z9181 History of falling: Secondary | ICD-10-CM | POA: Diagnosis not present

## 2017-04-17 DIAGNOSIS — M6281 Muscle weakness (generalized): Secondary | ICD-10-CM

## 2017-04-17 NOTE — Therapy (Signed)
[facility] [address]. [place], [place], [address] Phone: [phone]   Fax:  [phone]  Physical Therapy Treatment  Patient Details  Name: Bonnie Jackson MRN: [id number] Date of Birth: [birth year] Referring Provider: Dr. Army Melia  Encounter Date: 04/17/2017      PT End of Session - 04/17/17 0926    Visit Number 5   Number of Visits 8   Date for PT Re-Evaluation 04/29/17   Authorization - Visit Number 5   Authorization - Number of Visits 10   PT Start Time 0851   PT Stop Time 0946   PT Time Calculation (min) 55 min   Activity Tolerance Patient tolerated treatment well   Behavior During Therapy [facility] for tasks assessed/performed      Past Medical History:  Diagnosis Date  . Brain aneurysm   . Cataract    right  . CVA (cerebral infarction)    no deficits  . Hypertension     Past Surgical History:  Procedure Laterality Date  . ABDOMINAL HERNIA REPAIR    . CATARACT EXTRACTION W/PHACO Right 06/24/2016   Procedure: CATARACT EXTRACTION PHACO AND INTRAOCULAR LENS PLACEMENT (IOC);  Surgeon: Ronnell Freshwater, MD;  Location: [place];  Service: Ophthalmology;  Laterality: Right;  RIGHT  . CHOLECYSTECTOMY    . IR GENERIC HISTORICAL  01/12/2016   IR ANGIO INTRA EXTRACRAN SEL INTERNAL CAROTID BILAT MOD SED 01/12/2016 Consuella Lose, MD [facility]  . IR GENERIC HISTORICAL  01/12/2016   IR ANGIO VERTEBRAL SEL VERTEBRAL BILAT MOD SED 01/12/2016 Consuella Lose, MD [facility]  . IR GENERIC HISTORICAL  01/12/2016   IR 3D INDEPENDENT WKST 01/12/2016 Consuella Lose, MD [facility]  . REPLACEMENT TOTAL KNEE Bilateral   . TOTAL ABDOMINAL HYSTERECTOMY      There were no vitals filed for this visit.      Subjective Assessment - 04/17/17 0851    Subjective Pt. states R shoulder is doing better.  Pt. remains compliant with pool ex./ HEP.     Limitations Walking;House hold activities;Standing   Patient Stated Goals  Improve walking/ balance      Therex.:  Nustep L6 65min. B UE/LE (warm-up/no charge). Supine/ seated/ standing DF and PF (unable to DF in standing).     Neuro. Mm.:  Walking in clinic working on increase cadence/ step pattern/ heel strike and BOS.   Resisted gait pattern 6x all 4-planes (min. To no UE assist). Supine Alt. UE/LE to work on coordination/ sequencing.  Tandem stance/ walking with light UE assist.  Airex step ups/ wt. shifting.       PT Long Term Goals - 04/02/17 0707      PT LONG TERM GOAL #1   Title Pt. I with HEP to increase B LE muscle strength 1/2 muscle grade to improve standing/ walking.     Baseline  B LE muscle weakness: B hip flexion 4+/5, B hip IR/ER 4+/5, R quad. 5/5, L quad 4+/5, R hamstring 5/5, L hamstring 4/5, DF 5/5 MMT.    Time 4   Period Weeks   Status New   Target Date 04/29/17     PT LONG TERM GOAL #2   Title Pt. will increase LEFS to >55 out of 80 to improve overall functional mobility.    Baseline LEFS: 48 out of 80 on 8/28   Time 4   Period Weeks   Status New   Target Date 04/29/17     PT LONG TERM GOAL #  3   Title Pt. will increase Berg balance test to >52/56 to decrease fall risk/ improve walking.    Baseline Berg: 48/56   Time 4   Period Weeks   Status New   Target Date 04/29/17     PT LONG TERM GOAL #4   Title Pt. able to ambulate with least assistive device and normalized gait pattern to improve walking/ decrease fall risk.    Baseline L antalgic gait.  Benefits from [facility]   Time 4   Period Weeks   Status New   Target Date 04/29/17               Plan - 04/18/17 0035    Clinical Presentation Stable   Clinical Decision Making Moderate   Rehab Potential Good   PT Frequency 2x / week   PT Duration 4 weeks   PT Treatment/Interventions ADLs/Self Care Home Management;Aquatic Therapy;Functional mobility training;Stair training;Gait training;Therapeutic activities;Therapeutic exercise;Balance  training;Patient/family education;Neuromuscular re-education   PT Next Visit Plan Increase B LE muscle strength/ progress ex. program to more gym based/ aquatic ex.     PT Home Exercise Plan See handouts.       Patient will benefit from skilled therapeutic intervention in order to improve the following deficits and impairments:  Abnormal gait, Decreased endurance, Decreased activity tolerance, Decreased strength, Difficulty walking, Decreased balance, Decreased mobility, Decreased coordination, Postural dysfunction, Improper body mechanics, Decreased range of motion  Visit Diagnosis: Difficulty in walking, not elsewhere classified  Muscle weakness (generalized)  Risk for falls     Problem List Patient Active Problem List   Diagnosis Date Noted  . Gait instability 03/27/2017  . Muscle fasciculation 03/27/2017  . Gastroesophageal reflux disease 03/27/2017  . Mild single current episode of major depressive disorder ([place]) 05/22/2016  . OAB (overactive bladder) 05/22/2016  . Cataract of right eye 01/19/2016  . Eversion of the eyelid 01/19/2016  . Neoplasm of uncertain behavior of skin 01/19/2016  . Thalamic infarction ([place]) 11/16/2015  . Aneurysm, cerebral, nonruptured 01/04/2015  . Essential (primary) hypertension 01/04/2015  . HLD (hyperlipidemia) 01/04/2015  . Idiopathic insomnia 01/04/2015  . Special screening for malignant neoplasms, colon 01/04/2015    Pura Spice 04/18/2017, 12:37 AM  [facility] [address]. [place], [place], [address] Phone: [phone]   Fax:  [phone]  Name: Bonnie Jackson MRN: [id number] Date of Birth: Dec 16, 1940

## 2017-04-22 ENCOUNTER — Ambulatory Visit: Payer: Medicare Other | Admitting: Physical Therapy

## 2017-04-22 ENCOUNTER — Encounter: Payer: Self-pay | Admitting: Physical Therapy

## 2017-04-22 DIAGNOSIS — R262 Difficulty in walking, not elsewhere classified: Secondary | ICD-10-CM | POA: Diagnosis not present

## 2017-04-22 DIAGNOSIS — M6281 Muscle weakness (generalized): Secondary | ICD-10-CM

## 2017-04-22 DIAGNOSIS — Z9181 History of falling: Secondary | ICD-10-CM

## 2017-04-22 NOTE — Therapy (Signed)
[facility] [address]. [place], [place], [address] Phone: [phone]   Fax:  [phone]  Physical Therapy Treatment  Patient Details  Name: Bonnie Jackson MRN: [id number] Date of Birth: 28-Aug-1940 Referring Provider: Dr. Army Melia  Encounter Date: 04/22/2017      PT End of Session - 04/22/17 1015    Visit Number 6   Number of Visits 8   Date for PT Re-Evaluation 04/29/17   Authorization - Visit Number 6   Authorization - Number of Visits 10   PT Start Time 0901   PT Stop Time 0949   PT Time Calculation (min) 48 min   Activity Tolerance Patient tolerated treatment well   Behavior During Therapy [facility] for tasks assessed/performed      Past Medical History:  Diagnosis Date  . Brain aneurysm   . Cataract    right  . CVA (cerebral infarction)    no deficits  . Hypertension     Past Surgical History:  Procedure Laterality Date  . ABDOMINAL HERNIA REPAIR    . CATARACT EXTRACTION W/PHACO Right 06/24/2016   Procedure: CATARACT EXTRACTION PHACO AND INTRAOCULAR LENS PLACEMENT (IOC);  Surgeon: Ronnell Freshwater, MD;  Location: [place];  Service: Ophthalmology;  Laterality: Right;  RIGHT  . CHOLECYSTECTOMY    . IR GENERIC HISTORICAL  01/12/2016   IR ANGIO INTRA EXTRACRAN SEL INTERNAL CAROTID BILAT MOD SED 01/12/2016 Consuella Lose, MD [facility]  . IR GENERIC HISTORICAL  01/12/2016   IR ANGIO VERTEBRAL SEL VERTEBRAL BILAT MOD SED 01/12/2016 Consuella Lose, MD [facility]  . IR GENERIC HISTORICAL  01/12/2016   IR 3D INDEPENDENT WKST 01/12/2016 Consuella Lose, MD [facility]  . REPLACEMENT TOTAL KNEE Bilateral   . TOTAL ABDOMINAL HYSTERECTOMY      There were no vitals filed for this visit.      Subjective Assessment - 04/22/17 0938    Subjective Pt. states she is not feeling well this morning.  Pt. planning on returning to aquatic ex. program tomorrow.  No pain reported.     Limitations Walking;House hold  activities;Standing   Patient Stated Goals Improve walking/ balance   Currently in Pain? No/denies      Therex.:  Nustep L6 42min. B UE/LE (warm-up/no charge). Standing hip flexion/ abd./ heel raises (unable to do toe raises in standing)/ hip extension 20x. Forward/ backwards/ lateral walking with no UE assist in //-bars.      Neuro. Mm.:  Walking in clinic working on increase cadence/ step pattern/ heel strike and BOS.  Tandem stance/ walking with light UE assist.  Airex step ups/ marching/ side stepping/ back steps 15x in //-bars with cuing to decrease UE assist.    Reviewed current HEP (no changes at this time).           PT Long Term Goals - 04/02/17 0707      PT LONG TERM GOAL #1   Title Pt. I with HEP to increase B LE muscle strength 1/2 muscle grade to improve standing/ walking.     Baseline  B LE muscle weakness: B hip flexion 4+/5, B hip IR/ER 4+/5, R quad. 5/5, L quad 4+/5, R hamstring 5/5, L hamstring 4/5, DF 5/5 MMT.    Time 4   Period Weeks   Status New   Target Date 04/29/17     PT LONG TERM GOAL #2   Title Pt. will increase LEFS to >55 out of 80 to improve overall functional mobility.  Baseline LEFS: 48 out of 80 on 8/28   Time 4   Period Weeks   Status New   Target Date 04/29/17     PT LONG TERM GOAL #3   Title Pt. will increase Berg balance test to >52/56 to decrease fall risk/ improve walking.    Baseline Berg: 48/56   Time 4   Period Weeks   Status New   Target Date 04/29/17     PT LONG TERM GOAL #4   Title Pt. able to ambulate with least assistive device and normalized gait pattern to improve walking/ decrease fall risk.    Baseline L antalgic gait.  Benefits from [facility]   Time 4   Period Weeks   Status New   Target Date 04/29/17             Plan - 04/22/17 1016    Clinical Impression Statement Pt. able to complete increase resistance on Nustep with no c/o fatigue/ issues today.  Pt. showing slow but consistent progress  with generalized B LE muscle strengthening.  Pt. has benefited from recent referral to aquatic ex. program at [facility].  PT encouraged to remain active with walking/ HEP.  No change to HEP at this time.     Clinical Presentation Stable   Clinical Decision Making Moderate   Rehab Potential Good   PT Frequency 2x / week   PT Duration 4 weeks   PT Treatment/Interventions ADLs/Self Care Home Management;Aquatic Therapy;Functional mobility training;Stair training;Gait training;Therapeutic activities;Therapeutic exercise;Balance training;Patient/family education;Neuromuscular re-education   PT Next Visit Plan Increase B LE muscle strength/ progress ex. program to more gym based/ aquatic ex.     PT Home Exercise Plan See handouts.    Consulted and Agree with Plan of Care Patient      Patient will benefit from skilled therapeutic intervention in order to improve the following deficits and impairments:  Abnormal gait, Decreased endurance, Decreased activity tolerance, Decreased strength, Difficulty walking, Decreased balance, Decreased mobility, Decreased coordination, Postural dysfunction, Improper body mechanics, Decreased range of motion  Visit Diagnosis: Difficulty in walking, not elsewhere classified  Muscle weakness (generalized)  Risk for falls     Problem List Patient Active Problem List   Diagnosis Date Noted  . Gait instability 03/27/2017  . Muscle fasciculation 03/27/2017  . Gastroesophageal reflux disease 03/27/2017  . Mild single current episode of major depressive disorder ([place]) 05/22/2016  . OAB (overactive bladder) 05/22/2016  . Cataract of right eye 01/19/2016  . Eversion of the eyelid 01/19/2016  . Neoplasm of uncertain behavior of skin 01/19/2016  . Thalamic infarction ([place]) 11/16/2015  . Aneurysm, cerebral, nonruptured 01/04/2015  . Essential (primary) hypertension 01/04/2015  . HLD (hyperlipidemia) 01/04/2015  . Idiopathic insomnia 01/04/2015  . Special  screening for malignant neoplasms, colon 01/04/2015   Pura Spice, PT, DPT # [phone] 04/22/2017, 1:17 PM  [facility] [address] [place], [place], [address] Phone: [phone]   Fax:  [phone]  Name: Bonnie Jackson MRN: [id number] Date of Birth: [birth year]

## 2017-04-24 ENCOUNTER — Ambulatory Visit: Payer: Medicare Other | Admitting: Physical Therapy

## 2017-04-24 ENCOUNTER — Encounter: Payer: Self-pay | Admitting: Physical Therapy

## 2017-04-24 DIAGNOSIS — R262 Difficulty in walking, not elsewhere classified: Secondary | ICD-10-CM | POA: Diagnosis not present

## 2017-04-24 DIAGNOSIS — M6281 Muscle weakness (generalized): Secondary | ICD-10-CM | POA: Diagnosis not present

## 2017-04-24 DIAGNOSIS — Z9181 History of falling: Secondary | ICD-10-CM | POA: Diagnosis not present

## 2017-04-26 NOTE — Therapy (Signed)
[facility] [address] [place], [place], [address] Phone: [phone]   Fax:  [phone]  Physical Therapy Treatment  Patient Details  Name: Bonnie Jackson MRN: [id number] Date of Birth: 1941/03/03 Referring Provider: Dr. Army Melia  Encounter Date: 04/24/2017    Treatment 7 of 8.  End of recert 0/76/22    Past Medical History:  Diagnosis Date  . Brain aneurysm   . Cataract    right  . CVA (cerebral infarction)    no deficits  . Hypertension     Past Surgical History:  Procedure Laterality Date  . ABDOMINAL HERNIA REPAIR    . CATARACT EXTRACTION W/PHACO Right 06/24/2016   Procedure: CATARACT EXTRACTION PHACO AND INTRAOCULAR LENS PLACEMENT (IOC);  Surgeon: Ronnell Freshwater, MD;  Location: [place];  Service: Ophthalmology;  Laterality: Right;  RIGHT  . CHOLECYSTECTOMY    . IR GENERIC HISTORICAL  01/12/2016   IR ANGIO INTRA EXTRACRAN SEL INTERNAL CAROTID BILAT MOD SED 01/12/2016 Consuella Lose, MD [facility]  . IR GENERIC HISTORICAL  01/12/2016   IR ANGIO VERTEBRAL SEL VERTEBRAL BILAT MOD SED 01/12/2016 Consuella Lose, MD [facility]  . IR GENERIC HISTORICAL  01/12/2016   IR 3D INDEPENDENT WKST 01/12/2016 Consuella Lose, MD [facility]  . REPLACEMENT TOTAL KNEE Bilateral   . TOTAL ABDOMINAL HYSTERECTOMY      There were no vitals filed for this visit.    Pt. states she has felt dehydrated and hasn't gone to pool program this week.       Therex.:  Nustep L6 61min. B UE/LE (warm-up/no charge). Standing hip flexion/ abd./ heel raises (unable to do toe raises in standing)/ hip extension 20x. Forward/ backwards/ lateral walking with no UE assist in //-bars.    7# floor to waist box lifting/ B carrying Sit to stands with no UE assist   Neuro. Mm.:  Walking in clinic working on increase cadence/ step pattern/ heel strike and BOS.  Airex step ups/ marching/ side stepping/ back steps 15x in  //-bars with cuing to decrease UE assist.   Functional reaching (cones) with L/R UE (varying depths)- SBA for safety.  Cone taps on agility ladder (L/R). Step over plinths with no UE assist L/R.     Reviewed current HEP (no changes at this time).         PT discussed decreasing PT tx. frequency with more of a focus on independent/ gym based/ aquatic ex. program.  Pt. demonstrates good floor to waist lifting mechanics and B carrying technqiue with occasional cuing to increase hip flexion/ step pattern.  Difficulty with standing cone taps/ wt. shifting but improvement noted with practice.           PT Long Term Goals - 04/02/17 0707      PT LONG TERM GOAL #1   Title Pt. I with HEP to increase B LE muscle strength 1/2 muscle grade to improve standing/ walking.     Baseline  B LE muscle weakness: B hip flexion 4+/5, B hip IR/ER 4+/5, R quad. 5/5, L quad 4+/5, R hamstring 5/5, L hamstring 4/5, DF 5/5 MMT.    Time 4   Period Weeks   Status New   Target Date 04/29/17     PT LONG TERM GOAL #2   Title Pt. will increase LEFS to >55 out of 80 to improve overall functional mobility.    Baseline LEFS: 48 out of 80 on 8/28   Time 4  Period Weeks   Status New   Target Date 04/29/17     PT LONG TERM GOAL #3   Title Pt. will increase Berg balance test to >52/56 to decrease fall risk/ improve walking.    Baseline Berg: 48/56   Time 4   Period Weeks   Status New   Target Date 04/29/17     PT LONG TERM GOAL #4   Title Pt. able to ambulate with least assistive device and normalized gait pattern to improve walking/ decrease fall risk.    Baseline L antalgic gait.  Benefits from [facility]   Time 4   Period Weeks   Status New   Target Date 04/29/17               Plan - 04/26/17 0644    Clinical Impression Statement PT discussed decreasing PT tx. frequency with more of a focus on independent/ gym based/ aquatic ex. program.  Pt. demonstrates good floor to waist lifting mechanics  and B carrying technqiue with occasional cuing to increase hip flexion/ step pattern.  Difficulty with standing cone taps/ wt. shifting but improvement noted with practice.     Clinical Presentation Stable   Clinical Decision Making Moderate   Rehab Potential Good   PT Frequency 2x / week   PT Duration 4 weeks   PT Treatment/Interventions ADLs/Self Care Home Management;Aquatic Therapy;Functional mobility training;Stair training;Gait training;Therapeutic activities;Therapeutic exercise;Balance training;Patient/family education;Neuromuscular re-education   PT Next Visit Plan Increase B LE muscle strength/ progress ex. program to more gym based/ aquatic ex.     PT Home Exercise Plan See handouts.    Consulted and Agree with Plan of Care Patient      Patient will benefit from skilled therapeutic intervention in order to improve the following deficits and impairments:  Abnormal gait, Decreased endurance, Decreased activity tolerance, Decreased strength, Difficulty walking, Decreased balance, Decreased mobility, Decreased coordination, Postural dysfunction, Improper body mechanics, Decreased range of motion  Visit Diagnosis: Difficulty in walking, not elsewhere classified  Muscle weakness (generalized)  Risk for falls     Problem List Patient Active Problem List   Diagnosis Date Noted  . Gait instability 03/27/2017  . Muscle fasciculation 03/27/2017  . Gastroesophageal reflux disease 03/27/2017  . Mild single current episode of major depressive disorder ([place]) 05/22/2016  . OAB (overactive bladder) 05/22/2016  . Cataract of right eye 01/19/2016  . Eversion of the eyelid 01/19/2016  . Neoplasm of uncertain behavior of skin 01/19/2016  . Thalamic infarction ([place]) 11/16/2015  . Aneurysm, cerebral, nonruptured 01/04/2015  . Essential (primary) hypertension 01/04/2015  . HLD (hyperlipidemia) 01/04/2015  . Idiopathic insomnia 01/04/2015  . Special screening for malignant neoplasms, colon  01/04/2015   Pura Spice, PT, DPT # [phone] 04/26/2017, 6:48 AM  [place] [address] [place], [place], [address] Phone: [phone]   Fax:  [phone]  Name: Judah Chevere MRN: [id number] Date of Birth: 1941-07-17

## 2017-04-29 ENCOUNTER — Encounter: Payer: Medicare Other | Admitting: Physical Therapy

## 2017-05-01 ENCOUNTER — Encounter: Payer: Medicare Other | Admitting: Physical Therapy

## 2017-05-08 ENCOUNTER — Ambulatory Visit: Payer: Medicare Other | Admitting: Physical Therapy

## 2017-05-23 DIAGNOSIS — Z23 Encounter for immunization: Secondary | ICD-10-CM | POA: Diagnosis not present

## 2017-05-30 DIAGNOSIS — H401131 Primary open-angle glaucoma, bilateral, mild stage: Secondary | ICD-10-CM | POA: Diagnosis not present

## 2017-06-06 ENCOUNTER — Encounter: Payer: Self-pay | Admitting: Internal Medicine

## 2017-06-06 ENCOUNTER — Ambulatory Visit (INDEPENDENT_AMBULATORY_CARE_PROVIDER_SITE_OTHER): Payer: Medicare Other | Admitting: Internal Medicine

## 2017-06-06 VITALS — BP 130/74 | HR 75 | Ht 63.5 in | Wt 186.0 lb

## 2017-06-06 DIAGNOSIS — F320 Major depressive disorder, single episode, mild: Secondary | ICD-10-CM | POA: Diagnosis not present

## 2017-06-06 DIAGNOSIS — G629 Polyneuropathy, unspecified: Secondary | ICD-10-CM | POA: Diagnosis not present

## 2017-06-06 DIAGNOSIS — I10 Essential (primary) hypertension: Secondary | ICD-10-CM

## 2017-06-06 DIAGNOSIS — H918X1 Other specified hearing loss, right ear: Secondary | ICD-10-CM | POA: Diagnosis not present

## 2017-06-06 DIAGNOSIS — R21 Rash and other nonspecific skin eruption: Secondary | ICD-10-CM | POA: Diagnosis not present

## 2017-06-06 DIAGNOSIS — I639 Cerebral infarction, unspecified: Secondary | ICD-10-CM

## 2017-06-06 DIAGNOSIS — H9191 Unspecified hearing loss, right ear: Secondary | ICD-10-CM | POA: Insufficient documentation

## 2017-06-06 MED ORDER — TRIAMCINOLONE ACETONIDE 0.1 % EX CREA: 1.0000 "application " | TOPICAL_CREAM | Freq: Two times a day (BID) | CUTANEOUS | 0 refills | Status: DC

## 2017-06-06 MED ORDER — DULOXETINE HCL 60 MG PO CPEP: 60.0000 mg | ORAL_CAPSULE | Freq: Every day | ORAL | 3 refills | Status: DC

## 2017-06-06 NOTE — Progress Notes (Signed)
Date:  06/06/2017   Name:  Bonnie Jackson   DOB:  August 01, 1941   MRN:  [id number]   Chief Complaint: Pruritis (Started 3 weeks. Driving patient nuts. Tried cortisone, and vinager and no relief. Bumps on arms, back, and toes. )  She has tiny red bumps on her arms and legs, scattered total of <20.  They are very pruritic.  She has scratched most of them.  She has a pet but is confident there are not fleas.  She is compulsive about washing her bedclothes.  She has not seen any evidence of insect infestation.  She has tried cortisone and vinegar.  The occurrence has been over the past 3 weeks.   Rash  This is a new problem. The current episode started 1 to 4 weeks ago. The problem is unchanged. The affected locations include the left arm and right arm. The rash is characterized by itchiness and redness. She was exposed to nothing (she is not aware of any new exposure). Pertinent negatives include no fatigue, fever or shortness of breath.   Numbness in feet - this occurs at night and works up to her mid calf. She takes a B12 supplement but no change.  Depression - tearful and depression over issues with her daughter and her son's ex-girl friend whom she was very close to.  She tried Lexapro for a month in the summer but did not notice benefit so she stopped it.  Hearing loss - she has decreased hearing and cerumen.  She is bothered by the decrease but has not seen ENT.   Review of Systems  Constitutional: Negative for chills, fatigue, fever and unexpected weight change.  HENT: Positive for hearing loss.   Respiratory: Negative for chest tightness and shortness of breath.   Cardiovascular: Negative for chest pain and palpitations.  Genitourinary: Negative for dysuria.  Musculoskeletal: Positive for gait problem (uses a cane).  Skin: Positive for rash.  Neurological: Negative for dizziness and headaches.  Psychiatric/Behavioral: Positive for dysphoric mood (and anhedonia). Negative for sleep  disturbance.    Patient Active Problem List   Diagnosis Date Noted  . Gait instability 03/27/2017  . Muscle fasciculation 03/27/2017  . Gastroesophageal reflux disease 03/27/2017  . Mild single current episode of major depressive disorder ([place]) 05/22/2016  . OAB (overactive bladder) 05/22/2016  . Cataract of right eye 01/19/2016  . Eversion of the eyelid 01/19/2016  . Neoplasm of uncertain behavior of skin 01/19/2016  . Thalamic infarction ([place]) 11/16/2015  . Aneurysm, cerebral, nonruptured 01/04/2015  . Essential (primary) hypertension 01/04/2015  . HLD (hyperlipidemia) 01/04/2015  . Idiopathic insomnia 01/04/2015  . Special screening for malignant neoplasms, colon 01/04/2015    Prior to Admission medications   Medication Sig Start Date End Date Taking? Authorizing Provider  aspirin 325 MG tablet Take 1 tablet (325 mg total) by mouth daily. 11/17/15  Yes Dustin Flock, MD  atorvastatin (LIPITOR) 20 MG tablet Take 1 tablet (20 mg total) by mouth daily at 6 PM. 03/27/17  Yes Glean Hess, MD  latanoprost (XALATAN) 0.005 % ophthalmic solution 1 drop at bedtime.   Yes [provider]  lisinopril-hydrochlorothiazide (PRINZIDE,ZESTORETIC) 10-12.5 MG tablet TAKE ONE TABELT BY MOUTH DAILY 01/13/17  Yes Glean Hess, MD  traZODone (DESYREL) 50 MG tablet TAKE ONE TABLET BY MOUTH AT BEDTIME 04/11/17  Yes Glean Hess, MD    No Known Allergies  Past Surgical History:  Procedure Laterality Date  . ABDOMINAL HERNIA REPAIR    .  CATARACT EXTRACTION W/PHACO Right 06/24/2016   Procedure: CATARACT EXTRACTION PHACO AND INTRAOCULAR LENS PLACEMENT (IOC);  Surgeon: Ronnell Freshwater, MD;  Location: [facility];  Service: Ophthalmology;  Laterality: Right;  RIGHT  . CHOLECYSTECTOMY    . IR GENERIC HISTORICAL  01/12/2016   IR ANGIO INTRA EXTRACRAN SEL INTERNAL CAROTID BILAT MOD SED 01/12/2016 Consuella Lose, MD [facility]  . IR GENERIC HISTORICAL  01/12/2016   IR  ANGIO VERTEBRAL SEL VERTEBRAL BILAT MOD SED 01/12/2016 Consuella Lose, MD [facility]  . IR GENERIC HISTORICAL  01/12/2016   IR 3D INDEPENDENT WKST 01/12/2016 Consuella Lose, MD [facility]  . REPLACEMENT TOTAL KNEE Bilateral   . TOTAL ABDOMINAL HYSTERECTOMY      Social History  Substance Use Topics  . Smoking status: Never Smoker  . Smokeless tobacco: Never Used  . Alcohol use 0.6 oz/week    1 Cans of beer per week     Comment:       Medication list has been reviewed and updated.  PHQ 2/9 Scores 03/27/2017 05/22/2016 01/19/2016 06/06/2015  PHQ - 2 Score 2 4 0 0  PHQ- 9 Score 3 9 - -    Physical Exam  Constitutional: She is oriented to person, place, and time. She appears well-developed. No distress.  HENT:  Head: Normocephalic and atraumatic.  Neck: Normal range of motion. Neck supple.  Cardiovascular: Normal rate, regular rhythm and normal heart sounds.   Pulmonary/Chest: Effort normal and breath sounds normal. No respiratory distress. She has no wheezes.  Musculoskeletal: She exhibits no edema.  Neurological: She is alert and oriented to person, place, and time.  Skin: Skin is warm and dry. No rash noted.  Tiny red papules over arms and lower legs.  Single lesions in no particular pattern. No s/s bacterial infection  Psychiatric: She has a normal mood and affect. Her behavior is normal. Thought content normal.  Nursing note and vitals reviewed.   BP 130/74   Pulse 75   Ht 5' 3.5" (1.613 m)   Wt 186 lb (84.4 kg)   SpO2 96%   BMI 32.43 kg/m   Assessment and Plan: 1. Rash and nonspecific skin eruption Appears to be insect bites Recommend a service to screen for infestation  - triamcinolone cream (KENALOG) 0.1 %; Apply 1 application topically 2 (two) times daily.  Dispense: 30 g; Refill: 0  2. Essential (primary) hypertension controlled  3. Mild single current episode of major depressive disorder ([place]) Begin Cymbalta - may help with motivation and possibly  neuropathic sx - DULoxetine (CYMBALTA) 60 MG capsule; Take 1 capsule (60 mg total) by mouth daily.  Dispense: 30 capsule; Refill: 3  4. Neuropathy cymbalta as above B12 and/or B complex supplement daily  5. Other specified hearing loss of right ear, unspecified hearing status on contralateral side Consult ENT  Meds ordered this encounter  Medications  . DULoxetine (CYMBALTA) 60 MG capsule    Sig: Take 1 capsule (60 mg total) by mouth daily.    Dispense:  30 capsule    Refill:  3  . triamcinolone cream (KENALOG) 0.1 %    Sig: Apply 1 application topically 2 (two) times daily.    Dispense:  30 g    Refill:  0    Partially dictated using Editor, commissioning. Any errors are unintentional.  Halina Maidens, MD [facility]  06/06/2017

## 2017-06-06 NOTE — Patient Instructions (Signed)
Begin a B-complex supplement (and maybe continue B12)

## 2017-07-22 ENCOUNTER — Ambulatory Visit: Payer: Medicare Other | Admitting: Internal Medicine

## 2017-09-02 IMAGING — MR MR HEAD W/O CM
10 of 11 series · 34 of 48 positions shown · non-contrast
Comparison: Head CT without contrast 6568 hours today.

CLINICAL DATA: 74-year-old female who awoke at 9359 hours with left
lower extremity weakness and bilateral upper extremity weakness.
Hypertension. Initial encounter.

EXAM:
MRI HEAD WITHOUT CONTRAST
MRA HEAD WITHOUT CONTRAST
TECHNIQUE: Multiplanar, multiecho pulse sequences of the brain and surrounding
structures were obtained without intravenous contrast. Angiographic
images of the head were obtained using MRA technique without
contrast.

[Series 2: T1 · sagittal · 5.0mm · 0.45mm/px · 2 of 29 slices shown (1 of 2)]
[im 1/29]
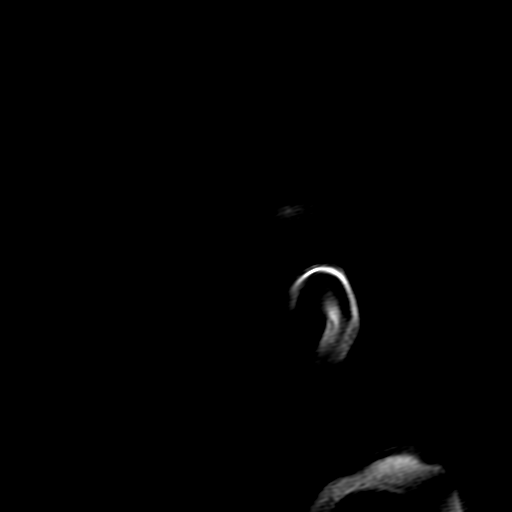
[im 29/29]
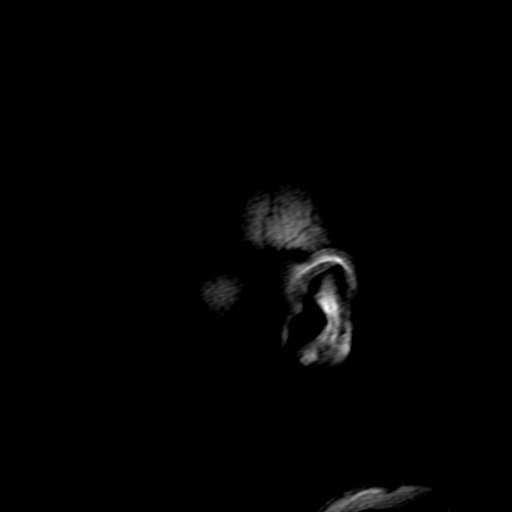

[Series 4: DWI · axial · 4.0mm · 0.94mm/px · z∈[-54,+114]mm · 3 of 43 slices shown (1 of 4)]
[im 1/43]
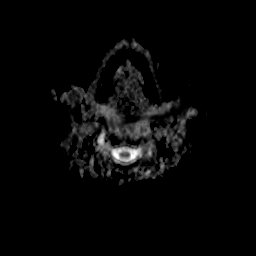
[im 22/43]
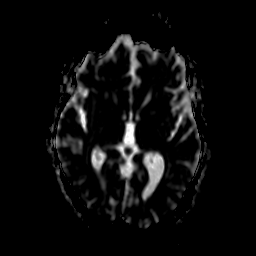
[im 43/43]
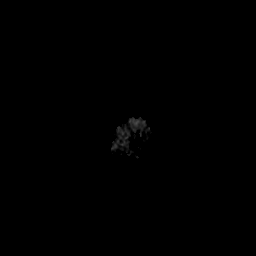

[Series 5: DWI · axial · 4.0mm · 0.94mm/px · z∈[-50,+114]mm · 4 of 42 slices shown (2 of 4)]
[im 1/42]
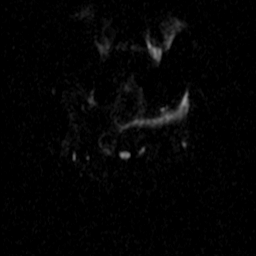
[im 14/42]
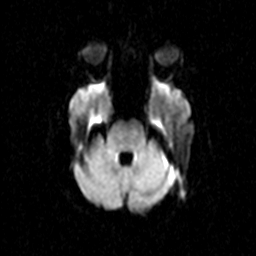
[im 28/42]
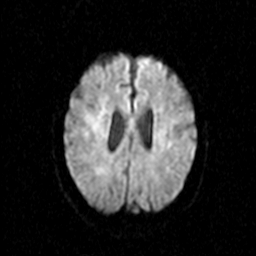
[im 42/42]
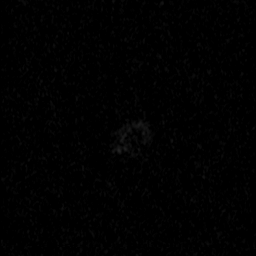

[Series 7: DWI · coronal · 5.0mm · 1.80mm/px · 4 of 39 slices shown (3 of 4)]
[im 1/39]
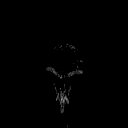
[im 13/39]
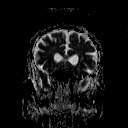
[im 26/39]
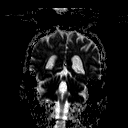
[im 39/39]
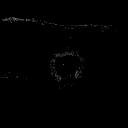

[Series 8: DWI · coronal · 5.0mm · 1.80mm/px · 4 of 37 slices shown (4 of 4)]
[im 1/37]
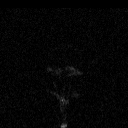
[im 13/37]
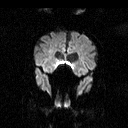
[im 25/37]
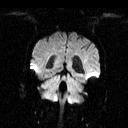
[im 37/37]
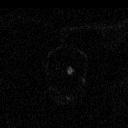

[Series 14: T2 · axial · 5.0mm · 0.45mm/px · z∈[-51,+117]mm · 3 of 27 slices shown (1 of 3)]
[im 1/27]
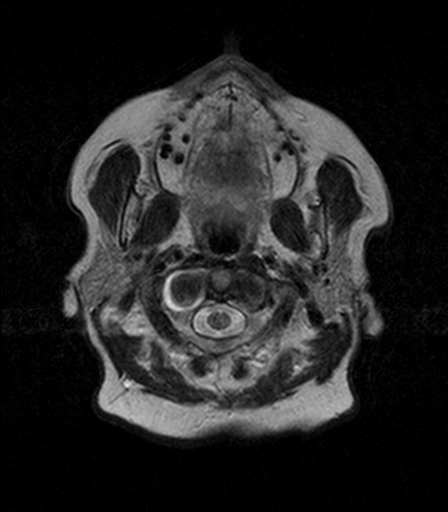
[im 14/27]
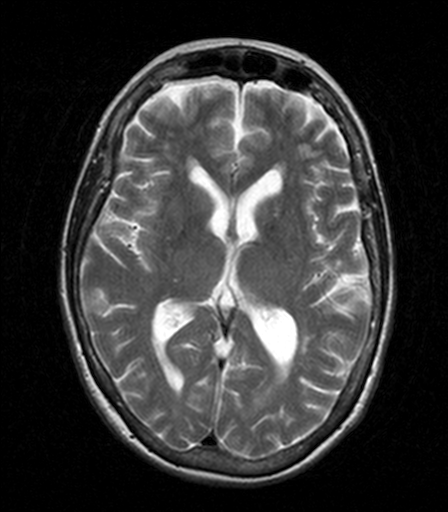
[im 27/27]
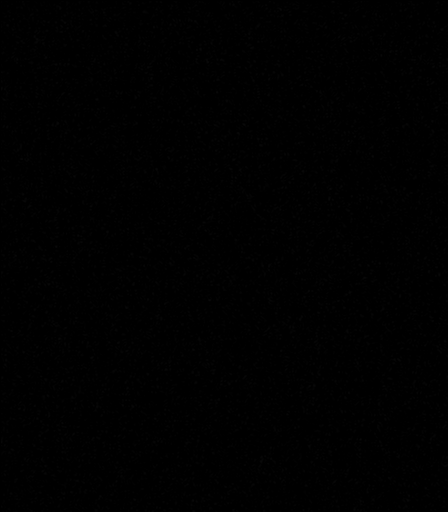

[Series 17: FLAIR · axial · 5.0mm · 0.90mm/px · z∈[-52,+117]mm · 3 of 27 slices shown]
[im 1/27]
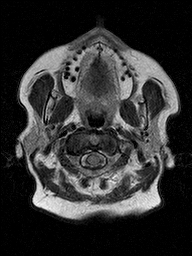
[im 14/27]
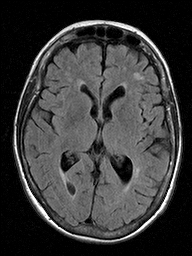
[im 27/27]
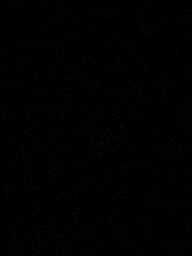

[Series 19: T2 · axial · 5.0mm · 0.45mm/px · z∈[-51,+117]mm · 3 of 27 slices shown (2 of 3)]
[im 1/27]
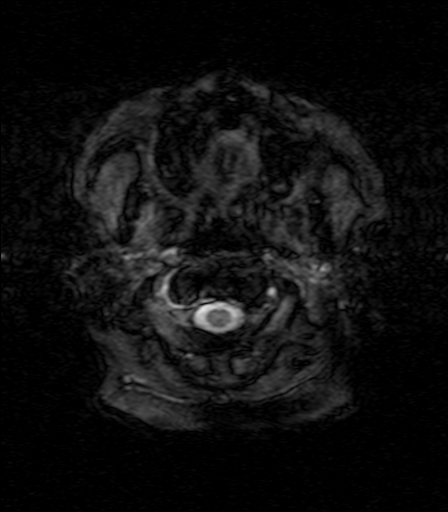
[im 14/27]
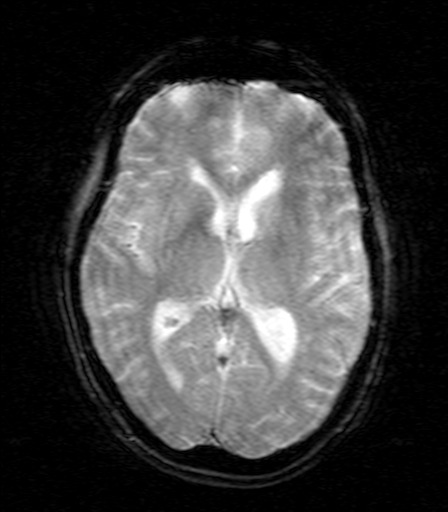
[im 27/27]
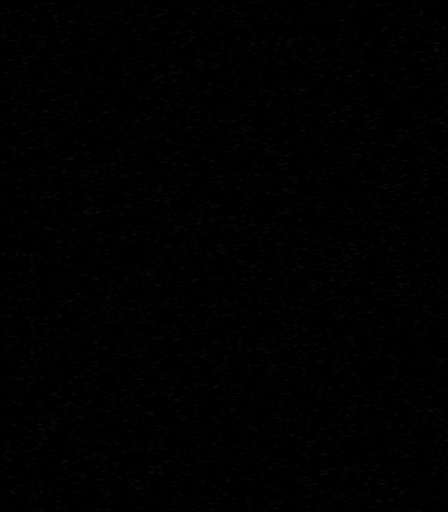

[Series 22: T1 · axial · 3.0mm · 0.45mm/px · z∈[-56,+85]mm · 5 of 60 slices shown (2 of 2)]
[im 1/60]
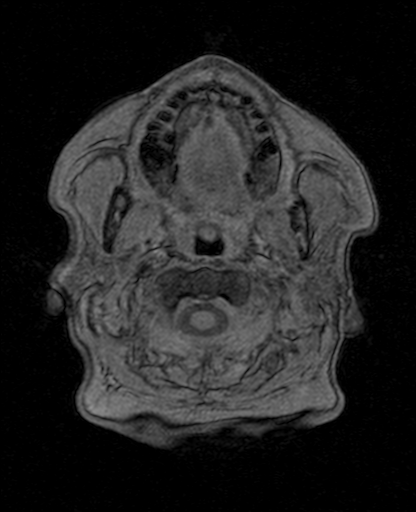
[im 12/60]
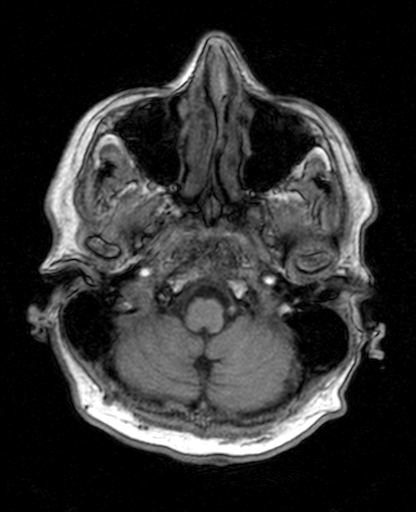
[im 24/60]
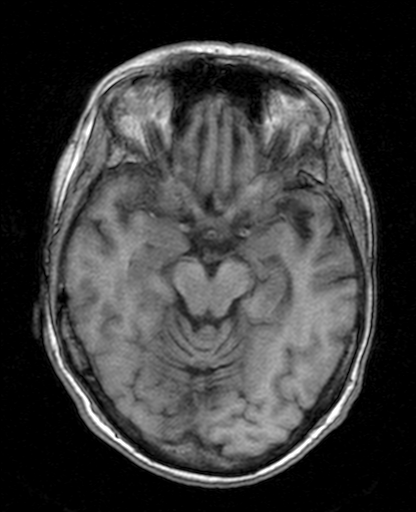
[im 36/60]
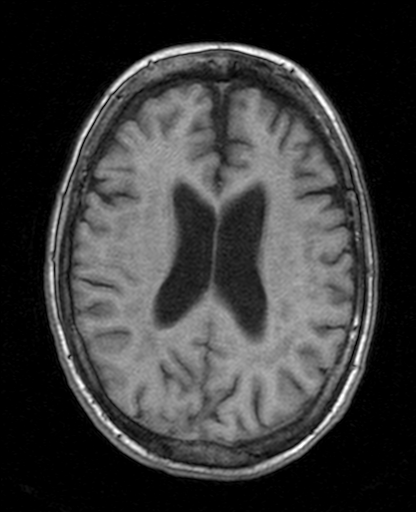
[im 48/60]
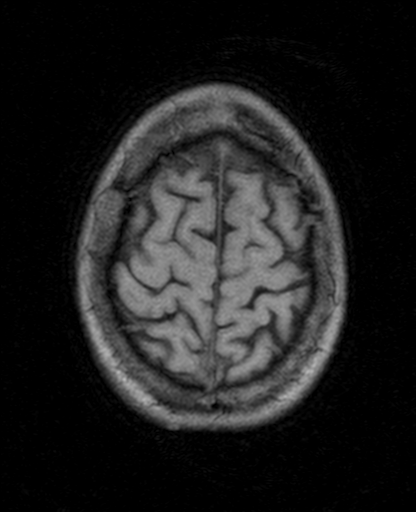

[Series 24: T2 · coronal · 5.0mm · 0.45mm/px · 3 of 29 slices shown (3 of 3)]
[im 1/29]
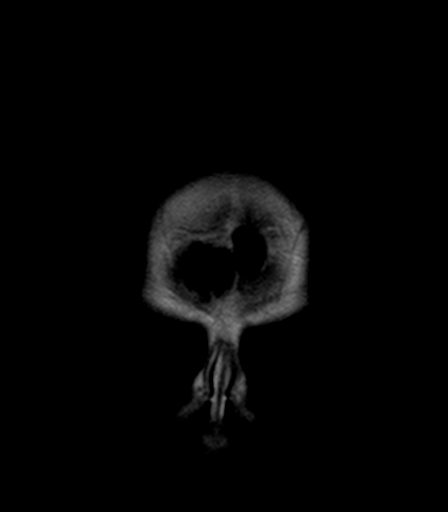
[im 15/29]
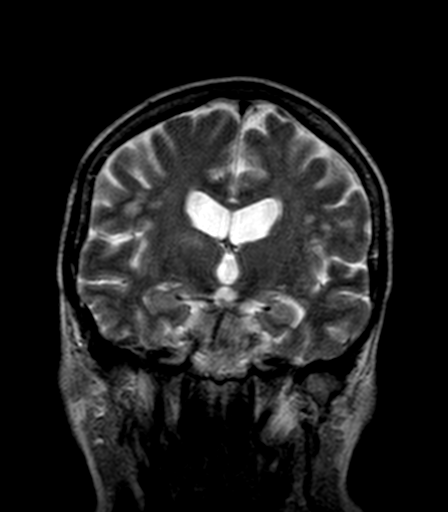
[im 29/29]
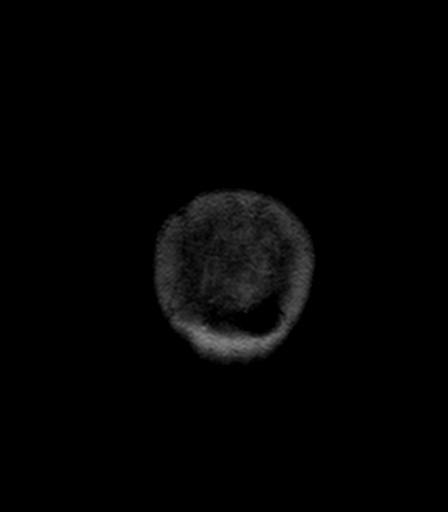

[34 of 48 positions shown; findings below may reference images not displayed]

FINDINGS: MRI HEAD FINDINGS

Major intracranial vascular flow voids are preserved.

18 mm oval focus of restricted diffusion along the superior aspect
of the right thalamus. Minimal T2 and FLAIR hyperintensity. No
associated hemorrhage or mass effect.

No contralateral or posterior fossa restricted diffusion.

Patchy and confluent bilateral cerebral white matter T2 and FLAIR
hyperintensity, moderate for age. No chronic cerebral blood products
or cortical encephalomalacia identified. Negative deep gray matter
nuclei aside from the acute findings. Brainstem and cerebellum also
appear within normal limits for age.

No midline shift, mass effect, evidence of mass lesion,
ventriculomegaly, extra-axial collection or acute intracranial
hemorrhage. Cervicomedullary junction and pituitary are within
normal limits. Visible internal auditory structures appear normal.
Negative for age cervical spine. Mastoids are clear. Mild paranasal
sinus mucosal thickening. Negative orbit and scalp soft tissues.

MRA HEAD FINDINGS

Antegrade flow in the posterior circulation with codominant distal
vertebral arteries. Normal left PICA origin. Both AICA origins are
patent, the right appears dominant. No basilar stenosis. Normal left
SCA and PCA origins. Fetal type right PCA origin.

Involving the right SCA origin there is a 4 mm superiorly directed
basilar tip aneurysm (series 9, image 69 and series 21, image 9).

Diminutive or absent left posterior communicating artery. The right
PCA P1 and P2 segments appear normal. Bilateral PCA branches are
within normal limits.

Antegrade flow in both ICA siphons. No siphon stenosis. Normal
ophthalmic and right posterior communicating artery origins. Patent
carotid termini. Mildly dominant left A1 segment. Anterior
communicating artery within normal limits. Small median artery of
the corpus callosum suspected. Motion artifact degrades detail of
the bilateral ACA branches otherwise. MCA M1 segments and
bifurcations are within normal limits. Motion artifact degrades
bilateral MCA branch detail otherwise. There is a small infundibulum
of the left MCA M1 segment at the anterior temporal artery origin
incidentally noted (series 18, image 11).
IMPRESSION: 1. Acute lacunar infarct in the right thalamus with no associated
hemorrhage or mass effect.
2. No emergent large vessel occlusion or definite associated MRA
finding (See # 3).
3. There is a small 4 mm basilar tip aneurysm directed superiorly to
the right which involves the right SCA origin.
4. Moderate nonspecific cerebral white matter T2 and FLAIR
hyperintensity, favor chronic small vessel disease in this setting.
5. Anterior circulation ACA and MCA branch detail on the MRA is
degraded by motion.

## 2017-11-28 DIAGNOSIS — H401131 Primary open-angle glaucoma, bilateral, mild stage: Secondary | ICD-10-CM | POA: Diagnosis not present

## 2018-01-30 ENCOUNTER — Other Ambulatory Visit: Payer: Self-pay | Admitting: Internal Medicine

## 2018-01-30 DIAGNOSIS — Z1231 Encounter for screening mammogram for malignant neoplasm of breast: Secondary | ICD-10-CM

## 2018-02-15 ENCOUNTER — Other Ambulatory Visit: Payer: Self-pay | Admitting: Internal Medicine

## 2018-02-15 DIAGNOSIS — I10 Essential (primary) hypertension: Secondary | ICD-10-CM

## 2018-03-12 ENCOUNTER — Ambulatory Visit
Admission: RE | Admit: 2018-03-12 | Discharge: 2018-03-12 | Disposition: A | Discharge status: Home / Self Care | Payer: Medicare Other | Source: Ambulatory Visit | Attending: Internal Medicine | Admitting: Internal Medicine

## 2018-03-12 ENCOUNTER — Encounter (INDEPENDENT_AMBULATORY_CARE_PROVIDER_SITE_OTHER): Payer: Self-pay

## 2018-03-12 DIAGNOSIS — Z1231 Encounter for screening mammogram for malignant neoplasm of breast: Secondary | ICD-10-CM

## 2018-03-30 ENCOUNTER — Ambulatory Visit (INDEPENDENT_AMBULATORY_CARE_PROVIDER_SITE_OTHER): Payer: Medicare Other

## 2018-03-30 VITALS — BP 138/60 | HR 77 | Temp 98.4°F | Resp 14 | Ht 64.0 in | Wt 183.2 lb

## 2018-03-30 DIAGNOSIS — E2839 Other primary ovarian failure: Secondary | ICD-10-CM

## 2018-03-30 DIAGNOSIS — Z0000 Encounter for general adult medical examination without abnormal findings: Secondary | ICD-10-CM

## 2018-03-30 NOTE — Patient Instructions (Signed)
Ms. Weinfeld , Thank you for taking time to come for your Medicare Wellness Visit. I appreciate your ongoing commitment to your health goals. Please review the following plan we discussed and let me know if I can assist you in the future.   Screening recommendations/referrals: Colorectal Screening: No longer required Mammogram: Up to date Bone Density: You will receive a call from our office regarding your appointment  Vision and Dental Exams: Recommended annual ophthalmology exams for early detection of glaucoma and other disorders of the eye Recommended annual dental exams for proper oral hygiene  Vaccinations: Influenza vaccine: Up to date Pneumococcal vaccine: Up to date Tdap vaccine: Declined. Please call your insurance company to determine your out of pocket expense. You may also receive this vaccine at your local pharmacy or Health Dept. Shingles vaccine: Please call your insurance company to determine your out of pocket expense for the Shingrix vaccine. You may also receive this vaccine at your local pharmacy or Health Dept.    Advanced directives: Advance directive discussed with you today. I have provided a copy for you to complete at home and have notarized. Once this is complete please bring a copy in to our office so we can scan it into your chart.  Goals: Recommend to drink at least 6-8 8oz glasses of water per day.  Next appointment: Please schedule your Annual Wellness Visit with your Nurse Health Advisor in one year.  Preventive Care 77 Years and Older, Female Preventive care refers to lifestyle choices and visits with your health care provider that can promote health and wellness. What does preventive care include?  A yearly physical exam. This is also called an annual well check.  Dental exams once or twice a year.  Routine eye exams. Ask your health care provider how often you should have your eyes checked.  Personal lifestyle choices, including:  Daily care of  your teeth and gums.  Regular physical activity.  Eating a healthy diet.  Avoiding tobacco and drug use.  Limiting alcohol use.  Practicing safe sex.  Taking low-dose aspirin every day.  Taking vitamin and mineral supplements as recommended by your health care provider. What happens during an annual well check? The services and screenings done by your health care provider during your annual well check will depend on your age, overall health, lifestyle risk factors, and family history of disease. Counseling  Your health care provider may ask you questions about your:  Alcohol use.  Tobacco use.  Drug use.  Emotional well-being.  Home and relationship well-being.  Sexual activity.  Eating habits.  History of falls.  Memory and ability to understand (cognition).  Work and work Statistician.  Reproductive health. Screening  You may have the following tests or measurements:  Height, weight, and BMI.  Blood pressure.  Lipid and cholesterol levels. These may be checked every 5 years, or more frequently if you are over 68 years old.  Skin check.  Lung cancer screening. You may have this screening every year starting at age 65 if you have a 30-pack-year history of smoking and currently smoke or have quit within the past 15 years.  Fecal occult blood test (FOBT) of the stool. You may have this test every year starting at age 50.  Flexible sigmoidoscopy or colonoscopy. You may have a sigmoidoscopy every 5 years or a colonoscopy every 10 years starting at age 61.  Hepatitis C blood test.  Hepatitis B blood test.  Sexually transmitted disease (STD) testing.  Diabetes screening.  This is done by checking your blood sugar (glucose) after you have not eaten for a while (fasting). You may have this done every 1-3 years.  Bone density scan. This is done to screen for osteoporosis. You may have this done starting at age 77.  Mammogram. This may be done every 1-2 years.  Talk to your health care provider about how often you should have regular mammograms. Talk with your health care provider about your test results, treatment options, and if necessary, the need for more tests. Vaccines  Your health care provider may recommend certain vaccines, such as:  Influenza vaccine. This is recommended every year.  Tetanus, diphtheria, and acellular pertussis (Tdap, Td) vaccine. You may need a Td booster every 10 years.  Zoster vaccine. You may need this after age 40.  Pneumococcal 13-valent conjugate (PCV13) vaccine. One dose is recommended after age 75.  Pneumococcal polysaccharide (PPSV23) vaccine. One dose is recommended after age 59. Talk to your health care provider about which screenings and vaccines you need and how often you need them. This information is not intended to replace advice given to you by your health care provider. Make sure you discuss any questions you have with your health care provider. Document Released: 08/18/2015 Document Revised: 04/10/2016 Document Reviewed: 05/23/2015 Elsevier Interactive Patient Education  2017 [place] Prevention in the Home Falls can cause injuries. They can happen to people of all ages. There are many things you can do to make your home safe and to help prevent falls. What can I do on the outside of my home?  Regularly fix the edges of walkways and driveways and fix any cracks.  Remove anything that might make you trip as you walk through a door, such as a raised step or threshold.  Trim any bushes or trees on the path to your home.  Use bright outdoor lighting.  Clear any walking paths of anything that might make someone trip, such as rocks or tools.  Regularly check to see if handrails are loose or broken. Make sure that both sides of any steps have handrails.  Any raised decks and porches should have guardrails on the edges.  Have any leaves, snow, or ice cleared regularly.  Use sand or  salt on walking paths during winter.  Clean up any spills in your garage right away. This includes oil or grease spills. What can I do in the bathroom?  Use night lights.  Install grab bars by the toilet and in the tub and shower. Do not use towel bars as grab bars.  Use non-skid mats or decals in the tub or shower.  If you need to sit down in the shower, use a plastic, non-slip stool.  Keep the floor dry. Clean up any water that spills on the floor as soon as it happens.  Remove soap buildup in the tub or shower regularly.  Attach bath mats securely with double-sided non-slip rug tape.  Do not have throw rugs and other things on the floor that can make you trip. What can I do in the bedroom?  Use night lights.  Make sure that you have a light by your bed that is easy to reach.  Do not use any sheets or blankets that are too big for your bed. They should not hang down onto the floor.  Have a firm chair that has side arms. You can use this for support while you get dressed.  Do not have throw rugs and  other things on the floor that can make you trip. What can I do in the kitchen?  Clean up any spills right away.  Avoid walking on wet floors.  Keep items that you use a lot in easy-to-reach places.  If you need to reach something above you, use a strong step stool that has a grab bar.  Keep electrical cords out of the way.  Do not use floor polish or wax that makes floors slippery. If you must use wax, use non-skid floor wax.  Do not have throw rugs and other things on the floor that can make you trip. What can I do with my stairs?  Do not leave any items on the stairs.  Make sure that there are handrails on both sides of the stairs and use them. Fix handrails that are broken or loose. Make sure that handrails are as long as the stairways.  Check any carpeting to make sure that it is firmly attached to the stairs. Fix any carpet that is loose or worn.  Avoid having  throw rugs at the top or bottom of the stairs. If you do have throw rugs, attach them to the floor with carpet tape.  Make sure that you have a light switch at the top of the stairs and the bottom of the stairs. If you do not have them, ask someone to add them for you. What else can I do to help prevent falls?  Wear shoes that:  Do not have high heels.  Have rubber bottoms.  Are comfortable and fit you well.  Are closed at the toe. Do not wear sandals.  If you use a stepladder:  Make sure that it is fully opened. Do not climb a closed stepladder.  Make sure that both sides of the stepladder are locked into place.  Ask someone to hold it for you, if possible.  Clearly mark and make sure that you can see:  Any grab bars or handrails.  First and last steps.  Where the edge of each step is.  Use tools that help you move around (mobility aids) if they are needed. These include:  Canes.  Walkers.  Scooters.  Crutches.  Turn on the lights when you go into a dark area. Replace any light bulbs as soon as they burn out.  Set up your furniture so you have a clear path. Avoid moving your furniture around.  If any of your floors are uneven, fix them.  If there are any pets around you, be aware of where they are.  Review your medicines with your doctor. Some medicines can make you feel dizzy. This can increase your chance of falling. Ask your doctor what other things that you can do to help prevent falls. This information is not intended to replace advice given to you by your health care provider. Make sure you discuss any questions you have with your health care provider. Document Released: 05/18/2009 Document Revised: 12/28/2015 Document Reviewed: 08/26/2014 Elsevier Interactive Patient Education  2017 [employer].

## 2018-03-30 NOTE — Progress Notes (Signed)
Subjective:   Bonnie Jackson is a 77 y.o. female who presents for Medicare Annual (Subsequent) preventive examination.  Review of Systems:  N/A Cardiac Risk Factors include: advanced age (>75men, >1 women);hypertension;dyslipidemia;obesity (BMI >30kg/m2);sedentary lifestyle     Objective:     Vitals: BP 138/60 (BP Location: Right Arm, Patient Position: Sitting, Cuff Size: Large)   Pulse 77   Temp 98.4 F (36.9 C) (Oral)   Resp 14   Ht 5\' 4"  (1.626 m)   Wt 183 lb 3.2 oz (83.1 kg)   SpO2 95%   BMI 31.45 kg/m   Body mass index is 31.45 kg/m.  Advanced Directives 03/30/2018 04/01/2017 03/27/2017 06/24/2016 02/27/2016 01/12/2016 12/21/2015  Does Patient Have a Medical Advance Directive? No No No No No Yes No  Does patient want to make changes to medical advance directive? - - - - - No - Patient declined -  Would patient like information on creating a medical advance directive? Yes (MAU/Ambulatory/Procedural Areas - Information given) - - No - patient declined information - - No - patient declined information    Tobacco Social History   Tobacco Use  Smoking Status Never Smoker  Smokeless Tobacco Never Used  Tobacco Comment   smoking cessation materials not required     Counseling given: No Comment: smoking cessation materials not required  Clinical Intake:  Pre-visit preparation completed: Yes  Pain : No/denies pain   BMI - recorded: 31.45 Nutritional Status: BMI > 30  Obese Nutritional Risks: None Diabetes: No  How often do you need to have someone help you when you read instructions, pamphlets, or other written materials from your doctor or pharmacy?: 1 - Never  Interpreter Needed?: No  Information entered by :: AEversole, LPN  Past Medical History:  Diagnosis Date  . Brain aneurysm   . Cataract    right  . CVA (cerebral infarction)    no deficits  . Hypertension    Past Surgical History:  Procedure Laterality Date  . ABDOMINAL HERNIA REPAIR    . CATARACT  EXTRACTION W/PHACO Right 06/24/2016   Procedure: CATARACT EXTRACTION PHACO AND INTRAOCULAR LENS PLACEMENT (IOC);  Surgeon: Ronnell Freshwater, MD;  Location: [facility];  Service: Ophthalmology;  Laterality: Right;  RIGHT  . CHOLECYSTECTOMY    . IR GENERIC HISTORICAL  01/12/2016   IR ANGIO INTRA EXTRACRAN SEL INTERNAL CAROTID BILAT MOD SED 01/12/2016 Consuella Lose, MD [facility]  . IR GENERIC HISTORICAL  01/12/2016   IR ANGIO VERTEBRAL SEL VERTEBRAL BILAT MOD SED 01/12/2016 Consuella Lose, MD [facility]  . IR GENERIC HISTORICAL  01/12/2016   IR 3D INDEPENDENT WKST 01/12/2016 Consuella Lose, MD [facility]  . REPLACEMENT TOTAL KNEE Bilateral   . TOTAL ABDOMINAL HYSTERECTOMY     Family History  Problem Relation Age of Onset  . Breast cancer Mother 46  . Heart attack Father   . Dementia Sister   . Cancer Brother        lung  . Cancer Sister        pancreatic  . Cancer Brother        lung  . Breast cancer Daughter 73   Social History   Socioeconomic History  . Marital status: Single    Spouse name: Not on file  . Number of children: 3  . Years of education: Not on file  . Highest education level: 12th grade  Occupational History  . Occupation: Retired  Scientific laboratory technician  . Financial resource strain: Not hard at  all  . Food insecurity:    Worry: Never true    Inability: Never true  . Transportation needs:    Medical: No    Non-medical: No  Tobacco Use  . Smoking status: Never Smoker  . Smokeless tobacco: Never Used  . Tobacco comment: smoking cessation materials not required  Substance and Sexual Activity  . Alcohol use: Yes    Alcohol/week: 1.0 standard drinks    Types: 1 Cans of beer per week    Comment:    . Drug use: No  . Sexual activity: Not Currently  Lifestyle  . Physical activity:    Days per week: 0 days    Minutes per session: 0 min  . Stress: Not at all  Relationships  . Social connections:    Talks on phone: Patient refused    Gets  together: Patient refused    Attends religious service: Patient refused    Active member of club or organization: Patient refused    Attends meetings of clubs or organizations: Patient refused    Relationship status: Patient refused  Other Topics Concern  . Not on file  Social History Narrative   Lives at home alone   Right-handed   Drinks 2 cups of coffee per day    Outpatient Encounter Medications as of 03/30/2018  Medication Sig  . aspirin EC 81 MG tablet Take 81 mg by mouth daily.  Marland Kitchen latanoprost (XALATAN) 0.005 % ophthalmic solution 1 drop at bedtime.  Marland Kitchen lisinopril-hydrochlorothiazide (PRINZIDE,ZESTORETIC) 10-12.5 MG tablet TAKE ONE TABELT BY MOUTH DAILY  . traZODone (DESYREL) 50 MG tablet TAKE ONE TABLET BY MOUTH AT BEDTIME  . aspirin 325 MG tablet Take 1 tablet (325 mg total) by mouth daily.  Marland Kitchen atorvastatin (LIPITOR) 20 MG tablet Take 1 tablet (20 mg total) by mouth daily at 6 PM. (Patient not taking: Reported on 03/30/2018)  . DULoxetine (CYMBALTA) 60 MG capsule Take 1 capsule (60 mg total) by mouth daily. (Patient not taking: Reported on 03/30/2018)  . triamcinolone cream (KENALOG) 0.1 % Apply 1 application topically 2 (two) times daily. (Patient not taking: Reported on 03/30/2018)   No facility-administered encounter medications on file as of 03/30/2018.     Activities of Daily Living In your present state of health, do you have any difficulty performing the following activities: 03/30/2018  Hearing? N  Comment denies hearing aids  Vision? N  Comment wears eyeglasses; glaucoma  Difficulty concentrating or making decisions? Y  Comment shor term memory loss  Walking or climbing stairs? N  Dressing or bathing? N  Doing errands, shopping? N  Preparing Food and eating ? N  Comment denies dentures  Using the Toilet? N  In the past six months, have you accidently leaked urine? N  Do you have problems with loss of bowel control? N  Managing your Medications? N  Managing your  Finances? N  Housekeeping or managing your Housekeeping? N  Some recent data might be hidden    Patient Care Team: Glean Hess, MD as PCP - General (Family Medicine) Consuella Lose, MD as Consulting Physician (Neurosurgery)    Assessment:   This is a routine wellness examination for [place].  Exercise Activities and Dietary recommendations Current Exercise Habits: The patient does not participate in regular exercise at present, Exercise limited by: None identified  Goals    . DIET - INCREASE WATER INTAKE     Recommend to drink at least 6-8 8oz glasses of water per day.  Fall Risk Fall Risk  03/30/2018 03/27/2017 05/22/2016 01/19/2016 06/06/2015  Falls in the past year? No No No Yes No  Risk for fall due to : Impaired vision;History of fall(s) - - - -  Risk for fall due to: Comment wears eyeglasses; ambulates with cane prn - - - -   FALL RISK PREVENTION PERTAINING TO HOME: Is your home free of loose throw rugs in walkways, pet beds, electrical cords, etc? Yes Is there adequate lighting in your home to reduce risk of falls?  Yes Are there stairs in or around your home WITH handrails? Yes  ASSISTIVE DEVICES UTILIZED TO PREVENT FALLS: Use of a cane, walker or w/c? Yes, cane prn Grab bars in the bathroom? Yes  Shower chair or a place to sit while bathing? No An elevated toilet seat or a handicapped toilet? No  Timed Get Up and Go Performed: Yes. Pt ambulated 10 feet within 10 sec. Gait stead-fast and without the use of an assistive device. No intervention required at this time. Fall risk prevention has been discussed.  Community Resource Referral:  Pt declined my offer to send [employer] Referral to Care Guide for a shower chair or an elevated toilet seat.  Depression Screen PHQ 2/9 Scores 03/30/2018 03/27/2017 05/22/2016 01/19/2016  PHQ - 2 Score 0 2 4 0  PHQ- 9 Score 0 3 9 -     Cognitive Function     6CIT Screen 03/30/2018 03/27/2017  What Year? 0 points  0 points  What month? 0 points 0 points  What time? 0 points 0 points  Count back from 20 0 points 0 points  Months in reverse 0 points 0 points  Repeat phrase 2 points 0 points  Total Score 2 0    Immunization History  Administered Date(s) Administered  . Influenza,inj,Quad PF,6+ Mos 06/06/2015, 05/22/2016  . Influenza-Unspecified 05/19/2017  . Pneumococcal Conjugate-13 12/22/2014  . Pneumococcal Polysaccharide-23 08/06/2011    Qualifies for Shingles Vaccine? Yes. Due for Shingrix. Education has been provided regarding the importance of this vaccine. Pt has been advised to call insurance company to determine out of pocket expense. Advised may also receive vaccine at local pharmacy or Health Dept. Verbalized acceptance and understanding.  Due for Tdap vaccine. Education has been provided regarding the importance of this vaccine. Advised may receive this vaccine at local pharmacy or Health Dept. Aware to provide a copy of the vaccination record if obtained from local pharmacy or Health Dept. Verbalized acceptance and understanding.  Screening Tests Health Maintenance  Topic Date Due  . INFLUENZA VACCINE  03/05/2018  . TETANUS/TDAP  03/31/2019 (Originally [birth year]/10/1960)  . DEXA SCAN  03/27/2022 (Originally 01/09/2006)  . PNA vac Low Risk Adult  Completed    Cancer Screenings: Lung: Low Dose CT Chest recommended if Age 66-80 years, 30 pack-year currently smoking OR have quit w/in 15years. Patient does not qualify. Breast:  Up to date on Mammogram? Yes. Completed 03/12/18. Repeat every year   Up to date of Bone Density/Dexa? No. Ordered today. Aware she will receive a call from our office re: her appt Colorectal: No longer required  Additional Screenings: Hepatitis C Screening: No longer required    Plan:  I have personally reviewed and addressed the Medicare Annual Wellness questionnaire and have noted the following in the patient's chart:  A. Medical and social history B. Use of  alcohol, tobacco or illicit drugs  C. Current medications and supplements D. Functional ability and status E.  Nutritional status F.  Physical activity G. Advance directives H. List of other physicians I.  Hospitalizations, surgeries, and ER visits in previous 12 months J.  [place] such as hearing and vision if needed, cognitive and depression L. Referrals and appointments  In addition, I have reviewed and discussed with patient certain preventive protocols, quality metrics, and best practice recommendations. A written personalized care plan for preventive services as well as general preventive health recommendations were provided to patient.  Signed,  Aleatha Borer, LPN Nurse Health Advisor  MD Recommendations: Due for Shingrix. Education has been provided regarding the importance of this vaccine. Pt has been advised to call insurance company to determine out of pocket expense. Advised may also receive vaccine at local pharmacy or Health Dept. Verbalized acceptance and understanding.  Due for Tdap vaccine. Education has been provided regarding the importance of this vaccine. Advised may receive this vaccine at local pharmacy or Health Dept. Aware to provide a copy of the vaccination record if obtained from local pharmacy or Health Dept. Verbalized acceptance and understanding.  Bone Density/Dexa: Ordered today. Aware she will receive a call from our office re: her appt

## 2018-04-20 ENCOUNTER — Other Ambulatory Visit: Payer: Medicare Other

## 2018-04-23 ENCOUNTER — Ambulatory Visit: Payer: Medicare Other | Admitting: Internal Medicine

## 2018-04-28 ENCOUNTER — Encounter: Payer: Self-pay | Admitting: Internal Medicine

## 2018-04-28 ENCOUNTER — Ambulatory Visit (INDEPENDENT_AMBULATORY_CARE_PROVIDER_SITE_OTHER): Payer: Medicare Other | Admitting: Internal Medicine

## 2018-04-28 VITALS — BP 128/60 | HR 72 | Ht 64.0 in | Wt 183.4 lb

## 2018-04-28 DIAGNOSIS — E782 Mixed hyperlipidemia: Secondary | ICD-10-CM

## 2018-04-28 DIAGNOSIS — I10 Essential (primary) hypertension: Secondary | ICD-10-CM | POA: Diagnosis not present

## 2018-04-28 DIAGNOSIS — I639 Cerebral infarction, unspecified: Secondary | ICD-10-CM

## 2018-04-28 DIAGNOSIS — G629 Polyneuropathy, unspecified: Secondary | ICD-10-CM | POA: Diagnosis not present

## 2018-04-28 DIAGNOSIS — Z23 Encounter for immunization: Secondary | ICD-10-CM | POA: Diagnosis not present

## 2018-04-28 DIAGNOSIS — I6381 Other cerebral infarction due to occlusion or stenosis of small artery: Secondary | ICD-10-CM

## 2018-04-28 MED ORDER — ATORVASTATIN CALCIUM 20 MG PO TABS: 20.0000 mg | ORAL_TABLET | Freq: Every day | ORAL | 1 refills | Status: DC

## 2018-04-28 NOTE — Progress Notes (Signed)
Date:  04/28/2018   Name:  Bonnie Jackson   DOB:  Mar 18, 1941   MRN:  [id number]   Chief Complaint: Edema (Swollen feet and ankles. Started on and off months ago. Started to get numb ion feet and ankles. Worse at night. By morning its gone. ) and Immunizations (high dose flu shot.)  Hypertension  This is a chronic problem. The problem is controlled. Pertinent negatives include no chest pain, headaches, palpitations or shortness of breath. Past treatments include ACE inhibitors and diuretics.  Hyperlipidemia  This is a chronic problem. Pertinent negatives include no chest pain or shortness of breath. She is currently on no antihyperlipidemic treatment (stopped lipitor several months ago). Risk factors: hx of stroke.   Edema - she gets mild swelling around her ankles by the end of the day.  It goes down overnight and is not painful.  Sometimes her feet feel slightly numb.  She limits her salt intake, she may not drink as much water as she should.  She has no PND or orthopnea.  She has not tried compression stockings.  There has been no change in her medications.   Review of Systems  Constitutional: Negative for chills, fatigue and fever.  Respiratory: Negative for chest tightness, shortness of breath and wheezing.   Cardiovascular: Positive for leg swelling. Negative for chest pain and palpitations.  Endocrine: Negative for polydipsia and polyuria.  Genitourinary: Positive for frequency.  Musculoskeletal: Positive for gait problem (due to knees). Negative for arthralgias.  Skin: Negative for rash.  Neurological: Negative for dizziness and headaches.  Psychiatric/Behavioral: Negative for dysphoric mood and sleep disturbance. The patient is not nervous/anxious.     Patient Active Problem List   Diagnosis Date Noted  . Neuropathy 06/06/2017  . Hearing loss of right ear 06/06/2017  . Gait instability 03/27/2017  . Muscle fasciculation 03/27/2017  . Gastroesophageal reflux disease 03/27/2017    . Mild single current episode of major depressive disorder ([place]) 05/22/2016  . OAB (overactive bladder) 05/22/2016  . Cataract of right eye 01/19/2016  . Eversion of the eyelid 01/19/2016  . Neoplasm of uncertain behavior of skin 01/19/2016  . Thalamic infarction ([place]) 11/16/2015  . Aneurysm, cerebral, nonruptured 01/04/2015  . Essential (primary) hypertension 01/04/2015  . HLD (hyperlipidemia) 01/04/2015  . Idiopathic insomnia 01/04/2015  . Special screening for malignant neoplasms, colon 01/04/2015    No Known Allergies  Past Surgical History:  Procedure Laterality Date  . ABDOMINAL HERNIA REPAIR    . CATARACT EXTRACTION W/PHACO Right 06/24/2016   Procedure: CATARACT EXTRACTION PHACO AND INTRAOCULAR LENS PLACEMENT (IOC);  Surgeon: Ronnell Freshwater, MD;  Location: [facility];  Service: Ophthalmology;  Laterality: Right;  RIGHT  . CHOLECYSTECTOMY    . IR GENERIC HISTORICAL  01/12/2016   IR ANGIO INTRA EXTRACRAN SEL INTERNAL CAROTID BILAT MOD SED 01/12/2016 Consuella Lose, MD [facility]  . IR GENERIC HISTORICAL  01/12/2016   IR ANGIO VERTEBRAL SEL VERTEBRAL BILAT MOD SED 01/12/2016 Consuella Lose, MD [facility]  . IR GENERIC HISTORICAL  01/12/2016   IR 3D INDEPENDENT WKST 01/12/2016 Consuella Lose, MD [facility]  . REPLACEMENT TOTAL KNEE Bilateral   . TOTAL ABDOMINAL HYSTERECTOMY      Social History   Tobacco Use  . Smoking status: Never Smoker  . Smokeless tobacco: Never Used  . Tobacco comment: smoking cessation materials not required  Substance Use Topics  . Alcohol use: Yes    Alcohol/week: 1.0 standard drinks    Types: 1 Cans  of beer per week    Comment:    . Drug use: No     Medication list has been reviewed and updated.  Current Meds  Medication Sig  . aspirin EC 81 MG tablet Take 81 mg by mouth daily.  Marland Kitchen latanoprost (XALATAN) 0.005 % ophthalmic solution 1 drop at bedtime.  Marland Kitchen lisinopril-hydrochlorothiazide (PRINZIDE,ZESTORETIC)  10-12.5 MG tablet TAKE ONE TABELT BY MOUTH DAILY  . traZODone (DESYREL) 50 MG tablet TAKE ONE TABLET BY MOUTH AT BEDTIME    PHQ 2/9 Scores 03/30/2018 03/27/2017 05/22/2016 01/19/2016  PHQ - 2 Score 0 2 4 0  PHQ- 9 Score 0 3 9 -    Physical Exam  Constitutional: She is oriented to person, place, and time. She appears well-developed. No distress.  HENT:  Head: Normocephalic and atraumatic.  Neck: Normal range of motion. Neck supple. Carotid bruit is not present.  Cardiovascular: Normal rate, regular rhythm and normal heart sounds.  No murmur heard. Pulmonary/Chest: Effort normal and breath sounds normal. No respiratory distress. She has no wheezes.  Musculoskeletal: She exhibits no edema or tenderness.  Neurological: She is alert and oriented to person, place, and time.  Skin: Skin is warm and dry. No rash noted.  Psychiatric: She has a normal mood and affect. Her behavior is normal. Thought content normal.  Nursing note and vitals reviewed.   BP 128/60 (BP Location: Right Arm, Patient Position: Sitting, Cuff Size: Normal)   Pulse 72   Ht 5\' 4"  (1.626 m)   Wt 183 lb 6.4 oz (83.2 kg)   SpO2 97%   BMI 31.48 kg/m   Assessment and Plan: 1. Essential (primary) hypertension Controlled Mild edema dependent - pt reassured, follow up if worsening - Comprehensive metabolic panel - CBC with Differential/Platelet - TSH  2. Neuropathy stable  3. Thalamic infarction ([place]) Resume statin therapy and recheck next visit - atorvastatin (LIPITOR) 20 MG tablet; Take 1 tablet (20 mg total) by mouth daily at 6 PM.  Dispense: 90 tablet; Refill: 1  4. Mixed hyperlipidemia - atorvastatin (LIPITOR) 20 MG tablet; Take 1 tablet (20 mg total) by mouth daily at 6 PM.  Dispense: 90 tablet; Refill: 1  5. Need for influenza vaccination - Flu vaccine HIGH DOSE PF   Partially dictated using Editor, commissioning. Any errors are unintentional.  Halina Maidens, MD [facility]  04/28/2018

## 2018-04-29 LAB — COMPREHENSIVE METABOLIC PANEL
ALBUMIN: 4.6 g/dL (ref 3.5–4.8)
ALK PHOS: 54 IU/L (ref 39–117)
ALT: 18 IU/L (ref 0–32)
AST: 18 IU/L (ref 0–40)
Albumin/Globulin Ratio: 2 (ref 1.2–2.2)
BILIRUBIN TOTAL: 0.4 mg/dL (ref 0.0–1.2)
BUN / CREAT RATIO: 29 — AB (ref 12–28)
BUN: 16 mg/dL (ref 8–27)
CO2: 23 mmol/L (ref 20–29)
CREATININE: 0.56 mg/dL — AB (ref 0.57–1.00)
Calcium: 10.3 mg/dL (ref 8.7–10.3)
Chloride: 102 mmol/L (ref 96–106)
GFR calc Af Amer: 104 mL/min/{1.73_m2} (ref >59)
GFR calc non Af Amer: 90 mL/min/{1.73_m2} (ref >59)
GLUCOSE: 106 mg/dL — AB (ref 65–99)
Globulin, Total: 2.3 g/dL (ref 1.5–4.5)
Potassium: 5 mmol/L (ref 3.5–5.2)
Sodium: 142 mmol/L (ref 134–144)
Total Protein: 6.9 g/dL (ref 6.0–8.5)

## 2018-04-29 LAB — CBC WITH DIFFERENTIAL/PLATELET
BASOS ABS: 0.1 10*3/uL (ref 0.0–0.2)
Basos: 1 %
EOS (ABSOLUTE): 0.3 10*3/uL (ref 0.0–0.4)
Eos: 5 %
HEMOGLOBIN: 14.1 g/dL (ref 11.1–15.9)
Hematocrit: 42.8 % (ref 34.0–46.6)
Immature Grans (Abs): 0 10*3/uL (ref 0.0–0.1)
Immature Granulocytes: 0 %
LYMPHS: 35 %
Lymphocytes Absolute: 2 10*3/uL (ref 0.7–3.1)
MCH: 31.5 pg (ref 26.6–33.0)
MCHC: 32.9 g/dL (ref 31.5–35.7)
MCV: 96 fL (ref 79–97)
MONOCYTES: 14 %
Monocytes Absolute: 0.8 10*3/uL (ref 0.1–0.9)
NEUTROS ABS: 2.5 10*3/uL (ref 1.4–7.0)
NEUTROS PCT: 45 %
PLATELETS: 282 10*3/uL (ref 150–450)
RBC: 4.47 x10E6/uL (ref 3.77–5.28)
RDW: 13 % (ref 12.3–15.4)
WBC: 5.7 10*3/uL (ref 3.4–10.8)

## 2018-04-29 LAB — TSH: TSH: 1.54 u[IU]/mL (ref 0.450–4.500)

## 2018-05-29 DIAGNOSIS — H401131 Primary open-angle glaucoma, bilateral, mild stage: Secondary | ICD-10-CM | POA: Diagnosis not present

## 2018-08-20 ENCOUNTER — Encounter: Payer: Self-pay | Admitting: Internal Medicine

## 2018-08-20 ENCOUNTER — Ambulatory Visit (INDEPENDENT_AMBULATORY_CARE_PROVIDER_SITE_OTHER): Payer: Medicare Other | Admitting: Internal Medicine

## 2018-08-20 VITALS — BP 138/82 | HR 77 | Ht 64.0 in | Wt 184.0 lb

## 2018-08-20 DIAGNOSIS — E538 Deficiency of other specified B group vitamins: Secondary | ICD-10-CM | POA: Diagnosis not present

## 2018-08-20 DIAGNOSIS — F321 Major depressive disorder, single episode, moderate: Secondary | ICD-10-CM | POA: Diagnosis not present

## 2018-08-20 DIAGNOSIS — G629 Polyneuropathy, unspecified: Secondary | ICD-10-CM

## 2018-08-20 DIAGNOSIS — I10 Essential (primary) hypertension: Secondary | ICD-10-CM

## 2018-08-20 NOTE — Patient Instructions (Signed)
Begin cymbalta 60 mg at bedtime  Call for refill after several weeks if no side effects

## 2018-08-20 NOTE — Progress Notes (Signed)
Date:  08/20/2018   Name:  Bonnie Jackson   DOB:  [birth year]   MRN:  [id number]   Chief Complaint: Numbness (Numbness and tingling in feet. Ongoing. Tingling radiates up below knee. More happening at night. ) and Depression (PHQ9- 9. Wants to know if she can start taking Duloxetine 60 mg. She has old Rx she never took.)  Depression         This is a recurrent problem.  The problem occurs daily.  Associated symptoms include decreased interest and sad.  Associated symptoms include no fatigue, no helplessness, no hopelessness, no headaches and no suicidal ideas.     The symptoms are aggravated by social issues.  Past treatments include nothing.  Numbness - she has numbness in both feet and lower legs that occurs only at night.  The severity is gradually worsening. During the day she has no sx.  She can go to sleep but when she wakes up to use the bathroom, both feet are numb.  After walking around a bit, the numbness resolves.  There is no pain or tingling, no itching.  She has no back or hip pain or history of surgery.  She uses a cane for ambulation since her CVA (2017 thalamic cva with dizziness)  Review of Systems  Constitutional: Negative for chills, fatigue and fever.  Respiratory: Negative for cough, shortness of breath and wheezing.   Cardiovascular: Negative for chest pain, palpitations and leg swelling.  Gastrointestinal: Negative for abdominal pain, constipation and diarrhea.  Endocrine: Negative for polyuria.  Musculoskeletal: Positive for gait problem. Negative for arthralgias and back pain.  Neurological: Positive for weakness and numbness. Negative for dizziness, tremors and headaches.  Hematological: Negative for adenopathy. Does not bruise/bleed easily.  Psychiatric/Behavioral: Positive for depression, dysphoric mood and sleep disturbance. Negative for suicidal ideas. The patient is nervous/anxious.     Patient Active Problem List   Diagnosis Date Noted  . Current moderate  episode of major depressive disorder without prior episode ([place]) 08/20/2018  . Neuropathy 06/06/2017  . Hearing loss of right ear 06/06/2017  . Gait instability 03/27/2017  . Muscle fasciculation 03/27/2017  . Gastroesophageal reflux disease 03/27/2017  . OAB (overactive bladder) 05/22/2016  . Cataract of right eye 01/19/2016  . Eversion of the eyelid 01/19/2016  . Neoplasm of uncertain behavior of skin 01/19/2016  . Thalamic infarction ([place]) 11/16/2015  . Aneurysm, cerebral, nonruptured 01/04/2015  . Essential (primary) hypertension 01/04/2015  . HLD (hyperlipidemia) 01/04/2015  . Idiopathic insomnia 01/04/2015  . Special screening for malignant neoplasms, colon 01/04/2015    No Known Allergies  Past Surgical History:  Procedure Laterality Date  . ABDOMINAL HERNIA REPAIR    . CATARACT EXTRACTION W/PHACO Right 06/24/2016   Procedure: CATARACT EXTRACTION PHACO AND INTRAOCULAR LENS PLACEMENT (IOC);  Surgeon: Ronnell Freshwater, MD;  Location: [facility];  Service: Ophthalmology;  Laterality: Right;  RIGHT  . CHOLECYSTECTOMY    . IR GENERIC HISTORICAL  01/12/2016   IR ANGIO INTRA EXTRACRAN SEL INTERNAL CAROTID BILAT MOD SED 01/12/2016 Consuella Lose, MD [facility]  . IR GENERIC HISTORICAL  01/12/2016   IR ANGIO VERTEBRAL SEL VERTEBRAL BILAT MOD SED 01/12/2016 Consuella Lose, MD [facility]  . IR GENERIC HISTORICAL  01/12/2016   IR 3D INDEPENDENT WKST 01/12/2016 Consuella Lose, MD [facility]  . REPLACEMENT TOTAL KNEE Bilateral   . TOTAL ABDOMINAL HYSTERECTOMY      Social History   Tobacco Use  . Smoking status: Never Smoker  .  Smokeless tobacco: Never Used  . Tobacco comment: smoking cessation materials not required  Substance Use Topics  . Alcohol use: Yes    Alcohol/week: 1.0 standard drinks    Types: 1 Cans of beer per week    Comment:    . Drug use: No     Medication list has been reviewed and updated.  Current Meds  Medication Sig  . aspirin  EC 81 MG tablet Take 81 mg by mouth daily.  Marland Kitchen atorvastatin (LIPITOR) 20 MG tablet Take 1 tablet (20 mg total) by mouth daily at 6 PM.  . latanoprost (XALATAN) 0.005 % ophthalmic solution 1 drop at bedtime.  Marland Kitchen lisinopril-hydrochlorothiazide (PRINZIDE,ZESTORETIC) 10-12.5 MG tablet TAKE ONE TABELT BY MOUTH DAILY  . traZODone (DESYREL) 50 MG tablet TAKE ONE TABLET BY MOUTH AT BEDTIME    PHQ 2/9 Scores 08/20/2018 03/30/2018 03/27/2017 05/22/2016  PHQ - 2 Score 2 0 2 4  PHQ- 9 Score 9 0 3 9    Physical Exam Vitals signs and nursing note reviewed.  Constitutional:      General: She is not in acute distress.    Appearance: She is well-developed.  HENT:     Head: Normocephalic and atraumatic.  Neck:     Musculoskeletal: Normal range of motion and neck supple.  Cardiovascular:     Rate and Rhythm: Normal rate and regular rhythm.     Pulses:          Dorsalis pedis pulses are 2+ on the right side and 2+ on the left side.       Posterior tibial pulses are 1+ on the right side and 1+ on the left side.  Pulmonary:     Effort: Pulmonary effort is normal. No respiratory distress.  Musculoskeletal: Normal range of motion.     Right lower leg: No edema.     Left lower leg: No edema.  Lymphadenopathy:     Cervical: No cervical adenopathy.  Skin:    General: Skin is warm and dry.     Findings: No rash.  Neurological:     Mental Status: She is alert and oriented to person, place, and time.     Cranial Nerves: Cranial nerves are intact.     Sensory: Sensation is intact.     Motor: Motor function is intact.     Deep Tendon Reflexes:     Reflex Scores:      Patellar reflexes are 1+ on the right side and 1+ on the left side. Psychiatric:        Behavior: Behavior normal.        Thought Content: Thought content normal.     BP 138/82 (BP Location: Right Arm, Patient Position: Sitting, Cuff Size: Normal)   Pulse 77   Ht 5\' 4"  (1.626 m)   Wt 184 lb (83.5 kg)   SpO2 97%   BMI 31.58 kg/m    Assessment and Plan: 1. Essential (primary) hypertension controlled - CBC with Differential/Platelet  2. Neuropathy Uncertain cause and unusual pattern May need EMG/NCS - ANA w/Reflex if Positive - Sedimentation rate  3. Current moderate episode of major depressive disorder without prior episode ([facility]) Begin cymbalta 60 mg qhs (has previous bottle) - call for refills if no SE - TSH  4. B12 nutritional deficiency Rule out vitamin def as cause of numbness - Vitamin B12   Partially dictated using Editor, commissioning. Any errors are unintentional.  Halina Maidens, MD [facility]  08/20/2018

## 2018-08-21 LAB — CBC WITH DIFFERENTIAL/PLATELET
BASOS ABS: 0.1 10*3/uL (ref 0.0–0.2)
Basos: 1 %
EOS (ABSOLUTE): 0.2 10*3/uL (ref 0.0–0.4)
Eos: 3 %
HEMOGLOBIN: 14.5 g/dL (ref 11.1–15.9)
Hematocrit: 42.4 % (ref 34.0–46.6)
IMMATURE GRANS (ABS): 0 10*3/uL (ref 0.0–0.1)
IMMATURE GRANULOCYTES: 0 %
LYMPHS: 31 %
Lymphocytes Absolute: 2 10*3/uL (ref 0.7–3.1)
MCH: 32.4 pg (ref 26.6–33.0)
MCHC: 34.2 g/dL (ref 31.5–35.7)
MCV: 95 fL (ref 79–97)
MONOCYTES: 14 %
Monocytes Absolute: 0.9 10*3/uL (ref 0.1–0.9)
NEUTROS PCT: 51 %
Neutrophils Absolute: 3.2 10*3/uL (ref 1.4–7.0)
PLATELETS: 303 10*3/uL (ref 150–450)
RBC: 4.48 x10E6/uL (ref 3.77–5.28)
RDW: 13.1 % (ref 11.7–15.4)
WBC: 6.3 10*3/uL (ref 3.4–10.8)

## 2018-08-21 LAB — SEDIMENTATION RATE: SED RATE: 12 mm/h (ref 0–40)

## 2018-08-21 LAB — VITAMIN B12: VITAMIN B 12: 508 pg/mL (ref 232–1245)

## 2018-08-21 LAB — ANA W/REFLEX IF POSITIVE: ANA: NEGATIVE

## 2018-08-21 LAB — TSH: TSH: 1.7 u[IU]/mL (ref 0.450–4.500)

## 2018-10-16 ENCOUNTER — Ambulatory Visit (INDEPENDENT_AMBULATORY_CARE_PROVIDER_SITE_OTHER): Payer: Medicare Other | Admitting: Internal Medicine

## 2018-10-16 ENCOUNTER — Encounter: Payer: Self-pay | Admitting: Internal Medicine

## 2018-10-16 ENCOUNTER — Other Ambulatory Visit: Payer: Self-pay

## 2018-10-16 VITALS — BP 128/64 | HR 82 | Ht 64.0 in | Wt 182.0 lb

## 2018-10-16 DIAGNOSIS — I6529 Occlusion and stenosis of unspecified carotid artery: Secondary | ICD-10-CM | POA: Insufficient documentation

## 2018-10-16 DIAGNOSIS — I6523 Occlusion and stenosis of bilateral carotid arteries: Secondary | ICD-10-CM

## 2018-10-16 DIAGNOSIS — E782 Mixed hyperlipidemia: Secondary | ICD-10-CM

## 2018-10-16 DIAGNOSIS — I10 Essential (primary) hypertension: Secondary | ICD-10-CM | POA: Diagnosis not present

## 2018-10-16 NOTE — Patient Instructions (Signed)
Resume Lipitor daily

## 2018-10-16 NOTE — Progress Notes (Signed)
Date:  10/16/2018   Name:  Charlayne Vultaggio   DOB:  [birth year]   MRN:  [id number]   Chief Complaint: Head Problem (Every morning she has a feeling of her head ( inside ) vibrating. Can sit on the side of bed and after a min or two it goes away. Been going for about a month. )  Pt describes a month of daily symptoms that occur only when lying in bed.  Often awakens her from sleep.  It feels like her head is vibrating but there is no tremor, dizziness, n/v/d, tinnitus or hearing loss.  No confusion, visual change or chest pain.  It does not change with position in bed.  Once she sits up the sx resolve within a few seconds.  They do not occur during the day. She has hx of aneurysm.  She had carotid US in 2017 - mild bilateral disease.  She is not taking lipitor - for unclear reasons.  HTN - she continues on her mediations.  No chest pains or dizziness. Hyperlipidemia - she has been prescribed lipitor but stopped some time ago for unclear reasons.  Review of Systems  Constitutional: Negative for chills, fatigue and fever.  HENT: Negative for ear pain, hearing loss, tinnitus, trouble swallowing and voice change.   Respiratory: Negative for cough, chest tightness, shortness of breath and wheezing.   Cardiovascular: Negative for chest pain and palpitations.  Gastrointestinal: Negative for constipation, diarrhea and nausea.  Genitourinary: Negative for difficulty urinating and hematuria.  Musculoskeletal: Negative for neck stiffness.  Allergic/Immunologic: Negative for environmental allergies.  Neurological: Negative for dizziness, tremors, syncope, facial asymmetry, weakness, light-headedness and headaches.  Psychiatric/Behavioral: Negative for dysphoric mood and sleep disturbance.    Patient Active Problem List   Diagnosis Date Noted  . Current moderate episode of major depressive disorder without prior episode ([place]) 08/20/2018  . B12 nutritional deficiency 08/20/2018  . Neuropathy 06/06/2017  .  Hearing loss of right ear 06/06/2017  . Gait instability 03/27/2017  . Muscle fasciculation 03/27/2017  . Gastroesophageal reflux disease 03/27/2017  . OAB (overactive bladder) 05/22/2016  . Cataract of right eye 01/19/2016  . Eversion of the eyelid 01/19/2016  . Neoplasm of uncertain behavior of skin 01/19/2016  . Thalamic infarction ([place]) 11/16/2015  . Aneurysm, cerebral, nonruptured 01/04/2015  . Essential (primary) hypertension 01/04/2015  . HLD (hyperlipidemia) 01/04/2015  . Idiopathic insomnia 01/04/2015  . Special screening for malignant neoplasms, colon 01/04/2015    No Known Allergies  Past Surgical History:  Procedure Laterality Date  . ABDOMINAL HERNIA REPAIR    . CATARACT EXTRACTION W/PHACO Right 06/24/2016   Procedure: CATARACT EXTRACTION PHACO AND INTRAOCULAR LENS PLACEMENT (IOC);  Surgeon: Ronnell Freshwater, MD;  Location: [facility];  Service: Ophthalmology;  Laterality: Right;  RIGHT  . CHOLECYSTECTOMY    . IR GENERIC HISTORICAL  01/12/2016   IR ANGIO INTRA EXTRACRAN SEL INTERNAL CAROTID BILAT MOD SED 01/12/2016 Consuella Lose, MD [facility]  . IR GENERIC HISTORICAL  01/12/2016   IR ANGIO VERTEBRAL SEL VERTEBRAL BILAT MOD SED 01/12/2016 Consuella Lose, MD [facility]  . IR GENERIC HISTORICAL  01/12/2016   IR 3D INDEPENDENT WKST 01/12/2016 Consuella Lose, MD [facility]  . REPLACEMENT TOTAL KNEE Bilateral   . TOTAL ABDOMINAL HYSTERECTOMY      Social History   Tobacco Use  . Smoking status: Never Smoker  . Smokeless tobacco: Never Used  . Tobacco comment: smoking cessation materials not required  Substance Use Topics  .  Alcohol use: Yes    Alcohol/week: 1.0 standard drinks    Types: 1 Cans of beer per week    Comment:    . Drug use: No     Medication list has been reviewed and updated.  Current Meds  Medication Sig  . aspirin EC 81 MG tablet Take 81 mg by mouth daily.  Marland Kitchen atorvastatin (LIPITOR) 20 MG tablet Take 1 tablet (20 mg  total) by mouth daily at 6 PM.  . latanoprost (XALATAN) 0.005 % ophthalmic solution 1 drop at bedtime.  Marland Kitchen lisinopril-hydrochlorothiazide (PRINZIDE,ZESTORETIC) 10-12.5 MG tablet TAKE ONE TABELT BY MOUTH DAILY  . traZODone (DESYREL) 50 MG tablet TAKE ONE TABLET BY MOUTH AT BEDTIME    PHQ 2/9 Scores 10/16/2018 08/20/2018 03/30/2018 03/27/2017  PHQ - 2 Score 0 2 0 2  PHQ- 9 Score 4 9 0 3    Physical Exam Vitals signs and nursing note reviewed.  Constitutional:      General: She is not in acute distress.    Appearance: She is well-developed.  HENT:     Head: Normocephalic and atraumatic.     Right Ear: Tympanic membrane and ear canal normal.     Left Ear: Tympanic membrane and ear canal normal.     Nose:     Right Sinus: No maxillary sinus tenderness.     Left Sinus: No maxillary sinus tenderness.     Mouth/Throat:     Mouth: Mucous membranes are moist.     Pharynx: No posterior oropharyngeal erythema.  Eyes:     Pupils: Pupils are equal, round, and reactive to light.  Neck:     Musculoskeletal: Normal range of motion and neck supple. No spinous process tenderness or muscular tenderness.     Vascular: No carotid bruit.  Cardiovascular:     Rate and Rhythm: Normal rate and regular rhythm.     Heart sounds: No murmur.  Pulmonary:     Effort: Pulmonary effort is normal. No respiratory distress.     Breath sounds: Normal breath sounds. No rales.  Musculoskeletal:     Right lower leg: No edema.     Left lower leg: No edema.  Lymphadenopathy:     Cervical: No cervical adenopathy.  Skin:    General: Skin is warm and dry.     Findings: No rash.  Neurological:     Mental Status: She is alert and oriented to person, place, and time.  Psychiatric:        Behavior: Behavior normal.        Thought Content: Thought content normal.     Wt Readings from Last 3 Encounters:  10/16/18 182 lb (82.6 kg)  08/20/18 184 lb (83.5 kg)  04/28/18 183 lb 6.4 oz (83.2 kg)    BP 128/64   Pulse 82    Ht 5\' 4"  (1.626 m)   Wt 182 lb (82.6 kg)   SpO2 94%   BMI 31.24 kg/m   Assessment and Plan: 1. Bilateral carotid artery stenosis May be the source of her atypical symptoms Resume lipitor and follow up for labs in 3 months - US Carotid Duplex Bilateral; Future  2. Essential (primary) hypertension controlled  3. Mixed hyperlipidemia Not currently being treated Resume lipitor   Partially dictated using Editor, commissioning. Any errors are unintentional.  Halina Maidens, MD [facility]  10/16/2018

## 2018-10-21 ENCOUNTER — Other Ambulatory Visit: Payer: Self-pay | Admitting: Internal Medicine

## 2018-10-21 ENCOUNTER — Ambulatory Visit: Payer: Medicare Other | Admitting: Internal Medicine

## 2018-10-21 DIAGNOSIS — E782 Mixed hyperlipidemia: Secondary | ICD-10-CM

## 2018-10-21 DIAGNOSIS — I639 Cerebral infarction, unspecified: Secondary | ICD-10-CM

## 2018-10-21 DIAGNOSIS — I6381 Other cerebral infarction due to occlusion or stenosis of small artery: Secondary | ICD-10-CM

## 2018-10-27 ENCOUNTER — Encounter: Payer: Medicare Other | Admitting: Internal Medicine

## 2018-10-27 ENCOUNTER — Ambulatory Visit: Payer: Medicare Other

## 2018-12-29 ENCOUNTER — Other Ambulatory Visit: Payer: Self-pay

## 2018-12-29 ENCOUNTER — Other Ambulatory Visit: Payer: Self-pay | Admitting: Internal Medicine

## 2018-12-29 ENCOUNTER — Ambulatory Visit
Admission: RE | Admit: 2018-12-29 | Discharge: 2018-12-29 | Disposition: A | Discharge status: Home / Self Care | Payer: Medicare Other | Source: Ambulatory Visit | Attending: Internal Medicine | Admitting: Internal Medicine

## 2018-12-29 DIAGNOSIS — I6523 Occlusion and stenosis of bilateral carotid arteries: Secondary | ICD-10-CM

## 2018-12-29 NOTE — Progress Notes (Signed)
Patient informed. Needs referral to Vascular Surgery. Wants to know if this can be urgent. Very afraid of artery blockage.

## 2018-12-29 NOTE — Progress Notes (Signed)
It does not need to be urgent.  The partial blockage did not develop overnight.  She does need to take cholesterol medication every day along with an aspirin 81 mg daily.

## 2019-01-18 ENCOUNTER — Ambulatory Visit (INDEPENDENT_AMBULATORY_CARE_PROVIDER_SITE_OTHER): Payer: Medicare Other | Admitting: Vascular Surgery

## 2019-01-18 ENCOUNTER — Other Ambulatory Visit: Payer: Self-pay

## 2019-01-18 ENCOUNTER — Encounter (INDEPENDENT_AMBULATORY_CARE_PROVIDER_SITE_OTHER): Payer: Self-pay | Admitting: Vascular Surgery

## 2019-01-18 VITALS — BP 171/76 | HR 85 | Resp 16 | Ht 63.5 in | Wt 183.2 lb

## 2019-01-18 DIAGNOSIS — E782 Mixed hyperlipidemia: Secondary | ICD-10-CM

## 2019-01-18 DIAGNOSIS — I6523 Occlusion and stenosis of bilateral carotid arteries: Secondary | ICD-10-CM | POA: Diagnosis not present

## 2019-01-18 DIAGNOSIS — I10 Essential (primary) hypertension: Secondary | ICD-10-CM | POA: Diagnosis not present

## 2019-01-18 DIAGNOSIS — Z79899 Other long term (current) drug therapy: Secondary | ICD-10-CM | POA: Diagnosis not present

## 2019-01-18 DIAGNOSIS — Z7982 Long term (current) use of aspirin: Secondary | ICD-10-CM | POA: Diagnosis not present

## 2019-01-18 DIAGNOSIS — K219 Gastro-esophageal reflux disease without esophagitis: Secondary | ICD-10-CM

## 2019-01-18 DIAGNOSIS — I671 Cerebral aneurysm, nonruptured: Secondary | ICD-10-CM | POA: Diagnosis not present

## 2019-01-18 NOTE — Progress Notes (Signed)
MRN : [id number]  Bonnie Jackson is a 78 y.o. (August 27, 1940) female who presents with chief complaint of  Chief Complaint  Patient presents with   New Patient (Initial Visit)    ref [facility] carotid stenosis  .  History of Present Illness:   The patient is seen for evaluation of carotid stenosis. The carotid stenosis was identified after a duplex was ordered to evaluate a bruit.  The patient denies amaurosis fugax. There is no recent history of TIA symptoms or focal motor deficits. There is no prior documented CVA.  There is no history of migraine headaches. There is no history of seizures.  She has a history of cerebral artery aneurysm.  The patient is taking enteric-coated aspirin 81 mg daily.  The patient has a history of coronary artery disease, no recent episodes of angina or shortness of breath. The patient denies PAD or claudication symptoms. There is a history of hyperlipidemia which is being treated with a statin.    Current Meds  Medication Sig   aspirin EC 325 MG tablet Take 325 mg by mouth daily.    atorvastatin (LIPITOR) 20 MG tablet TAKE 1 TABLET (20 MG TOTAL) BY MOUTH DAILY AT 6 PM.   latanoprost (XALATAN) 0.005 % ophthalmic solution 1 drop at bedtime.   lisinopril-hydrochlorothiazide (PRINZIDE,ZESTORETIC) 10-12.5 MG tablet TAKE ONE TABELT BY MOUTH DAILY    Past Medical History:  Diagnosis Date   Brain aneurysm    Cataract    right   CVA (cerebral infarction)    no deficits   Hypertension     Past Surgical History:  Procedure Laterality Date   ABDOMINAL HERNIA REPAIR     CATARACT EXTRACTION W/PHACO Right 06/24/2016   Procedure: CATARACT EXTRACTION PHACO AND INTRAOCULAR LENS PLACEMENT ([place]);  Surgeon: Ronnell Freshwater, MD;  Location: [facility];  Service: Ophthalmology;  Laterality: Right;  RIGHT   CHOLECYSTECTOMY     IR GENERIC HISTORICAL  01/12/2016   IR ANGIO INTRA EXTRACRAN SEL INTERNAL CAROTID BILAT MOD SED 01/12/2016 Consuella Lose, MD [facility]   IR GENERIC HISTORICAL  01/12/2016   IR ANGIO VERTEBRAL SEL VERTEBRAL BILAT MOD SED 01/12/2016 Consuella Lose, MD [facility]   IR GENERIC HISTORICAL  01/12/2016   IR 3D INDEPENDENT WKST 01/12/2016 Consuella Lose, MD [facility]   REPLACEMENT TOTAL KNEE Bilateral    TOTAL ABDOMINAL HYSTERECTOMY      Social History Social History   Tobacco Use   Smoking status: Never Smoker   Smokeless tobacco: Never Used   Tobacco comment: smoking cessation materials not required  Substance Use Topics   Alcohol use: Yes    Alcohol/week: 1.0 standard drinks    Types: 1 Cans of beer per week    Comment:     Drug use: No    Family History Family History  Problem Relation Age of Onset   Breast cancer Mother 39   Heart attack Father    Dementia Sister    Cancer Brother        lung   Cancer Sister        pancreatic   Cancer Brother        lung   Breast cancer Daughter 36  No family history of bleeding/clotting disorders, porphyria or autoimmune disease   No Known Allergies   REVIEW OF SYSTEMS (Negative unless checked)  Constitutional: [] Weight loss  [] Fever  [] Chills Cardiac: [] Chest pain   [] Chest pressure   [] Palpitations   [] Shortness of breath when laying  flat   [] Shortness of breath with exertion. Vascular:  [] Pain in legs with walking   [] Pain in legs at rest  [] History of DVT   [] Phlebitis   [] Swelling in legs   [] Varicose veins   [] Non-healing ulcers Pulmonary:   [] Uses home oxygen   [] Productive cough   [] Hemoptysis   [] Wheeze  [] COPD   [] Asthma Neurologic:  [] Dizziness   [] Seizures   [] History of stroke   [] History of TIA  [] Aphasia   [] Vissual changes   [] Weakness or numbness in arm   [] Weakness or numbness in leg Musculoskeletal:   [] Joint swelling   [] Joint pain   [] Low back pain Hematologic:  [] Easy bruising  [] Easy bleeding   [] Hypercoagulable state   [] Anemic Gastrointestinal:  [] Diarrhea   [] Vomiting  [] Gastroesophageal  reflux/heartburn   [] Difficulty swallowing. Genitourinary:  [] Chronic kidney disease   [] Difficult urination  [] Frequent urination   [] Blood in urine Skin:  [] Rashes   [] Ulcers  Psychological:  [] History of anxiety   []  History of major depression.  Physical Examination  Vitals:   01/18/19 1508  BP: (!) 171/76  Pulse: 85  Resp: 16  Weight: 183 lb 3.2 oz (83.1 kg)  Height: 5' 3.5" (1.613 m)   Body mass index is 31.94 kg/m. Gen: WD/WN, NAD Head: [place]/AT, No temporalis wasting.  Ear/Nose/Throat: Hearing grossly intact, nares w/o erythema or drainage, poor dentition Eyes: PER, EOMI, sclera nonicteric.  Neck: Supple, no masses.  No bruit or JVD.  Pulmonary:  Good air movement, clear to auscultation bilaterally, no use of accessory muscles.  Cardiac: RRR, normal S1, S2, no Murmurs. Vascular: scattered varicosities present bilaterally.  Mild venous stasis changes to the legs bilaterally.  2+ soft pitting edema Vessel Right Left  Radial Palpable Palpable  Brachial Palpable Palpable  Carotid Palpable Palpable  PT Palpable Palpable  DP Palpable Palpable  Gastrointestinal: soft, non-distended. No guarding/no peritoneal signs.  Musculoskeletal: M/S 5/5 throughout.  No deformity or atrophy.  Neurologic: CN 2-12 intact. Pain and light touch intact in extremities.  Symmetrical.  Speech is fluent. Motor exam as listed above. Psychiatric: Judgment intact, Mood & affect appropriate for pt's clinical situation. Dermatologic: Mild venous rashes no ulcers noted.  No changes consistent with cellulitis. Lymph : No Cervical lymphadenopathy, no lichenification or skin changes of chronic lymphedema.  CBC Lab Results  Component Value Date   WBC 6.3 08/20/2018   HGB 14.5 08/20/2018   HCT 42.4 08/20/2018   MCV 95 08/20/2018   PLT 303 08/20/2018    BMET    Component Value Date/Time   NA 142 04/28/2018 0854   K 5.0 04/28/2018 0854   CL 102 04/28/2018 0854   CO2 23 04/28/2018 0854   GLUCOSE 106  (H) 04/28/2018 0854   GLUCOSE 153 (H) 02/27/2016 0951   BUN 16 04/28/2018 0854   CREATININE 0.56 (L) 04/28/2018 0854   CALCIUM 10.3 04/28/2018 0854   GFRNONAA 90 04/28/2018 0854   GFRAA 104 04/28/2018 0854   CrCl cannot be calculated (Patient's most recent lab result is older than the maximum 21 days allowed.).  COAG Lab Results  Component Value Date   INR 0.99 01/12/2016   INR 0.95 11/16/2015    Radiology US Carotid Duplex Bilateral  Result Date: 12/29/2018 CLINICAL DATA:  Carotid atherosclerosis EXAM: BILATERAL CAROTID DUPLEX ULTRASOUND TECHNIQUE: Pearline Cables scale imaging, color Doppler and duplex ultrasound were performed of bilateral carotid and vertebral arteries in the neck. COMPARISON:  11/16/2015 FINDINGS: Criteria: Quantification of carotid stenosis is based on  velocity parameters that correlate the residual internal carotid diameter with NASCET-based stenosis levels, using the diameter of the distal internal carotid lumen as the denominator for stenosis measurement. The following velocity measurements were obtained: RIGHT ICA: 199/58 cm/sec CCA: 409/81 cm/sec SYSTOLIC ICA/CCA RATIO:  1.8 ECA: 112 cm/sec LEFT ICA: 190/40 cm/sec CCA: 191/47 cm/sec SYSTOLIC ICA/CCA RATIO:  1.5 ECA: 35 cm/sec RIGHT CAROTID ARTERY: Moderate heterogeneous partially calcified proximal right ICA atherosclerosis. This narrows the ICA lumen by grayscale imaging. In this region there is mild velocity elevation measuring 199/58 centimeters/second but no significant turbulent flow. Degree of stenosis estimated at 50-69% by ultrasound criteria. RIGHT VERTEBRAL ARTERY:  Antegrade LEFT CAROTID ARTERY: Similar scattered minor echogenic plaque formation. No hemodynamically significant left ICA stenosis, velocity elevation, or turbulent flow. LEFT VERTEBRAL ARTERY:  Antegrade IMPRESSION: Right ICA stenosis estimated at 50-69% by ultrasound criteria Left ICA narrowing less than 50% Patent antegrade vertebral flow bilaterally  Electronically Signed   By: Jerilynn Mages.  Shick M.D.   On: 12/29/2018 08:54     Assessment/Plan 1. Bilateral carotid artery stenosis Recommend:  Given the patient's asymptomatic subcritical stenosis no further invasive testing or surgery at this time.  Duplex ultrasound shows 40-59% stenosis bilaterally.  Continue antiplatelet therapy as prescribed Continue management of CAD, HTN and Hyperlipidemia Healthy heart diet,  encouraged exercise at least 4 times per week Follow up in 6 months with duplex ultrasound and physical exam   - VAS US CAROTID; Future  2. Aneurysm, cerebral, nonruptured I will ask Neurosurgery to see for evaluation and future surveillance.  She notes that she has been aware of her aneurysm for years and was evaluated a while back in [place].  It appears to have been by a neuro interventional radiologist.  I will ask Dr. Cari Caraway to see her and plan for her future care.  - Ambulatory referral to Neurosurgery  3. Essential (primary) hypertension Continue antihypertensive medications as already ordered, these medications have been reviewed and there are no changes at this time.   4. Mixed hyperlipidemia Continue statin as ordered and reviewed, no changes at this time   5. Gastroesophageal reflux disease, esophagitis presence not specified Continue PPI as already ordered, this medication has been reviewed and there are no changes at this time.  Avoidence of caffeine and alcohol  Moderate elevation of the head of the bed    Hortencia Pilar, MD  01/18/2019 3:55 PM

## 2019-01-20 ENCOUNTER — Ambulatory Visit: Payer: Medicare Other | Admitting: Internal Medicine

## 2019-01-28 ENCOUNTER — Ambulatory Visit (INDEPENDENT_AMBULATORY_CARE_PROVIDER_SITE_OTHER): Payer: Medicare Other | Admitting: Internal Medicine

## 2019-01-28 ENCOUNTER — Other Ambulatory Visit: Payer: Self-pay

## 2019-01-28 ENCOUNTER — Encounter: Payer: Self-pay | Admitting: Internal Medicine

## 2019-01-28 VITALS — BP 132/68 | HR 75 | Ht 63.5 in | Wt 184.0 lb

## 2019-01-28 DIAGNOSIS — I639 Cerebral infarction, unspecified: Secondary | ICD-10-CM | POA: Diagnosis not present

## 2019-01-28 DIAGNOSIS — I6523 Occlusion and stenosis of bilateral carotid arteries: Secondary | ICD-10-CM

## 2019-01-28 DIAGNOSIS — F5101 Primary insomnia: Secondary | ICD-10-CM | POA: Diagnosis not present

## 2019-01-28 DIAGNOSIS — E782 Mixed hyperlipidemia: Secondary | ICD-10-CM

## 2019-01-28 DIAGNOSIS — I671 Cerebral aneurysm, nonruptured: Secondary | ICD-10-CM | POA: Diagnosis not present

## 2019-01-28 DIAGNOSIS — I10 Essential (primary) hypertension: Secondary | ICD-10-CM

## 2019-01-28 MED ORDER — LISINOPRIL-HYDROCHLOROTHIAZIDE 10-12.5 MG PO TABS: 1.0000 | ORAL_TABLET | Freq: Every day | ORAL | 3 refills | Status: DC

## 2019-01-28 NOTE — Progress Notes (Signed)
Date:  01/28/2019   Name:  Bonnie Jackson   DOB:  05/02/1941   MRN:  [id number]   Chief Complaint: Hypertension (3 month follow up.) and Depression  Hyperlipidemia This is a chronic problem. Pertinent negatives include no chest pain, myalgias or shortness of breath. Current antihyperlipidemic treatment includes statins (resumed statin 3 mo ago). There are no compliance problems.  Risk factors: bilat CAS.  Hypertension This is a chronic problem. The problem is controlled. Pertinent negatives include no chest pain, headaches, palpitations or shortness of breath. Past treatments include ACE inhibitors and diuretics. The current treatment provides significant improvement.  Insomnia Primary symptoms: sleep disturbance, no difficulty falling asleep, frequent awakening.  The symptoms are aggravated by bed partner (and old dog that has to be helped to the bathroom during the night). Treatments tried: trazodone.  CAS - seen by [facility] - recommend monitoring every 6 months, statins and aspirin.   No surgical intervention yet Cerebral aneurysm - has been referred to Neurosurgery.   Review of Systems  Constitutional: Positive for fatigue. Negative for chills, diaphoresis and fever.  HENT: Negative for trouble swallowing.   Respiratory: Negative for chest tightness, shortness of breath and wheezing.   Cardiovascular: Negative for chest pain, palpitations and leg swelling.  Gastrointestinal: Negative for abdominal pain and constipation.  Genitourinary: Negative for dysuria.  Musculoskeletal: Positive for gait problem. Negative for myalgias.  Allergic/Immunologic: Negative for environmental allergies.  Neurological: Negative for dizziness, light-headedness and headaches.  Psychiatric/Behavioral: Positive for sleep disturbance. Negative for dysphoric mood. The patient has insomnia. The patient is not nervous/anxious.     Patient Active Problem List   Diagnosis Date Noted  . Carotid artery stenosis  10/16/2018  . Current moderate episode of major depressive disorder without prior episode ([place]) 08/20/2018  . B12 nutritional deficiency 08/20/2018  . Neuropathy 06/06/2017  . Hearing loss of right ear 06/06/2017  . Gait instability 03/27/2017  . Muscle fasciculation 03/27/2017  . Gastroesophageal reflux disease 03/27/2017  . OAB (overactive bladder) 05/22/2016  . Cataract of right eye 01/19/2016  . Eversion of the eyelid 01/19/2016  . Neoplasm of uncertain behavior of skin 01/19/2016  . Thalamic infarction ([place]) 11/16/2015  . Aneurysm, cerebral, nonruptured 01/04/2015  . Essential (primary) hypertension 01/04/2015  . Mixed hyperlipidemia 01/04/2015  . Idiopathic insomnia 01/04/2015  . Special screening for malignant neoplasms, colon 01/04/2015    No Known Allergies  Past Surgical History:  Procedure Laterality Date  . ABDOMINAL HERNIA REPAIR    . CATARACT EXTRACTION W/PHACO Right 06/24/2016   Procedure: CATARACT EXTRACTION PHACO AND INTRAOCULAR LENS PLACEMENT (IOC);  Surgeon: Ronnell Freshwater, MD;  Location: [facility];  Service: Ophthalmology;  Laterality: Right;  RIGHT  . CHOLECYSTECTOMY    . IR GENERIC HISTORICAL  01/12/2016   IR ANGIO INTRA EXTRACRAN SEL INTERNAL CAROTID BILAT MOD SED 01/12/2016 Consuella Lose, MD [facility]  . IR GENERIC HISTORICAL  01/12/2016   IR ANGIO VERTEBRAL SEL VERTEBRAL BILAT MOD SED 01/12/2016 Consuella Lose, MD [facility]  . IR GENERIC HISTORICAL  01/12/2016   IR 3D INDEPENDENT WKST 01/12/2016 Consuella Lose, MD [facility]  . REPLACEMENT TOTAL KNEE Bilateral   . TOTAL ABDOMINAL HYSTERECTOMY      Social History   Tobacco Use  . Smoking status: Never Smoker  . Smokeless tobacco: Never Used  . Tobacco comment: smoking cessation materials not required  Substance Use Topics  . Alcohol use: Yes    Alcohol/week: 1.0 standard drinks    Types:  1 Cans of beer per week    Comment:    . Drug use: No     Medication list  has been reviewed and updated.  Current Meds  Medication Sig  . aspirin EC 325 MG tablet Take 325 mg by mouth daily.   Marland Kitchen atorvastatin (LIPITOR) 20 MG tablet TAKE 1 TABLET (20 MG TOTAL) BY MOUTH DAILY AT 6 PM.  . latanoprost (XALATAN) 0.005 % ophthalmic solution 1 drop at bedtime.  Marland Kitchen lisinopril-hydrochlorothiazide (PRINZIDE,ZESTORETIC) 10-12.5 MG tablet TAKE ONE TABELT BY MOUTH DAILY  . traZODone (DESYREL) 50 MG tablet TAKE ONE TABLET BY MOUTH AT BEDTIME    PHQ 2/9 Scores 01/28/2019 10/16/2018 08/20/2018 03/30/2018  PHQ - 2 Score 0 0 2 0  PHQ- 9 Score 5 4 9  0    BP Readings from Last 3 Encounters:  01/28/19 132/68  01/18/19 (!) 171/76  10/16/18 128/64    Physical Exam Vitals signs and nursing note reviewed.  Constitutional:      General: She is not in acute distress.    Appearance: She is well-developed.  HENT:     Head: Normocephalic and atraumatic.  Neck:     Musculoskeletal: Normal range of motion and neck supple.     Vascular: No carotid bruit.  Cardiovascular:     Rate and Rhythm: Normal rate and regular rhythm.     Heart sounds: No murmur.  Pulmonary:     Effort: Pulmonary effort is normal. No respiratory distress.     Breath sounds: No wheezing or rhonchi.  Musculoskeletal: Normal range of motion.     Right lower leg: No edema.     Left lower leg: No edema.  Skin:    General: Skin is warm and dry.     Capillary Refill: Capillary refill takes less than 2 seconds.     Findings: No rash.  Neurological:     Mental Status: She is alert and oriented to person, place, and time.  Psychiatric:        Behavior: Behavior normal.        Thought Content: Thought content normal.     Wt Readings from Last 3 Encounters:  01/28/19 184 lb (83.5 kg)  01/18/19 183 lb 3.2 oz (83.1 kg)  10/16/18 182 lb (82.6 kg)    BP 132/68   Pulse 75   Ht 5' 3.5" (1.613 m)   Wt 184 lb (83.5 kg)   SpO2 97%   BMI 32.08 kg/m   Assessment and Plan: 1. Mixed hyperlipidemia Continue  statin therapy - Comprehensive metabolic panel - Lipid panel  2. Essential (primary) hypertension Controlled, continue current medication - Comprehensive metabolic panel - lisinopril-hydrochlorothiazide (ZESTORETIC) 10-12.5 MG tablet; Take 1 tablet by mouth daily.  Dispense: 90 tablet; Refill: 3  3. Bilateral carotid artery stenosis On statin and aspirin Check labs  4. Aneurysm, cerebral, nonruptured Referred to Neurosurgery  5. Idiopathic insomnia Try low dose trazodone 25 mg qhs PRN   Partially dictated using Editor, commissioning. Any errors are unintentional.  Halina Maidens, MD [facility]  01/28/2019

## 2019-01-29 LAB — COMPREHENSIVE METABOLIC PANEL
ALT: 29 IU/L (ref 0–32)
AST: 22 IU/L (ref 0–40)
Albumin/Globulin Ratio: 2 (ref 1.2–2.2)
Albumin: 4.7 g/dL (ref 3.7–4.7)
Alkaline Phosphatase: 61 IU/L (ref 39–117)
BUN/Creatinine Ratio: 30 — ABNORMAL HIGH (ref 12–28)
BUN: 24 mg/dL (ref 8–27)
Bilirubin Total: 0.4 mg/dL (ref 0.0–1.2)
CO2: 25 mmol/L (ref 20–29)
Calcium: 10.8 mg/dL — ABNORMAL HIGH (ref 8.7–10.3)
Chloride: 101 mmol/L (ref 96–106)
Creatinine, Ser: 0.8 mg/dL (ref 0.57–1.00)
GFR calc Af Amer: 82 mL/min/{1.73_m2} (ref >59)
GFR calc non Af Amer: 71 mL/min/{1.73_m2} (ref >59)
Globulin, Total: 2.4 g/dL (ref 1.5–4.5)
Glucose: 109 mg/dL — ABNORMAL HIGH (ref 65–99)
Potassium: 5 mmol/L (ref 3.5–5.2)
Sodium: 140 mmol/L (ref 134–144)
Total Protein: 7.1 g/dL (ref 6.0–8.5)

## 2019-01-29 LAB — LIPID PANEL
Chol/HDL Ratio: 2.8 ratio (ref 0.0–4.4)
Cholesterol, Total: 169 mg/dL (ref 100–199)
HDL: 61 mg/dL (ref >39)
LDL Calculated: 84 mg/dL (ref 0–99)
Triglycerides: 120 mg/dL (ref 0–149)
VLDL Cholesterol Cal: 24 mg/dL (ref 5–40)

## 2019-02-04 DIAGNOSIS — I671 Cerebral aneurysm, nonruptured: Secondary | ICD-10-CM | POA: Diagnosis not present

## 2019-02-26 ENCOUNTER — Encounter: Payer: Self-pay | Admitting: Internal Medicine

## 2019-02-26 ENCOUNTER — Ambulatory Visit (INDEPENDENT_AMBULATORY_CARE_PROVIDER_SITE_OTHER): Payer: Medicare Other | Admitting: Internal Medicine

## 2019-02-26 ENCOUNTER — Other Ambulatory Visit: Payer: Self-pay

## 2019-02-26 VITALS — BP 136/64 | HR 71 | Temp 97.6°F | Ht 63.5 in | Wt 186.0 lb

## 2019-02-26 DIAGNOSIS — I639 Cerebral infarction, unspecified: Secondary | ICD-10-CM

## 2019-02-26 DIAGNOSIS — R2681 Unsteadiness on feet: Secondary | ICD-10-CM

## 2019-02-26 DIAGNOSIS — E538 Deficiency of other specified B group vitamins: Secondary | ICD-10-CM

## 2019-02-26 DIAGNOSIS — I10 Essential (primary) hypertension: Secondary | ICD-10-CM

## 2019-02-26 NOTE — Progress Notes (Signed)
Date:  02/26/2019   Name:  Bonnie Jackson   DOB:  1941/01/13   MRN:  [id number]   Chief Complaint: PT Treatment (Seen PT in past. Wants referral for [facility] to be seen for balance issue. 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 )  Gait instability - she has been seen by PT in the past and had improvement.  However, she is noticing more concerns with balance and has had to get the neighbor to take her trash to and from the street to avoid the uneven yard. She has not fallen and uses a cane for assistance. She also noted tingling in her feet at night.  She has a hx of B12 def but has not been taking supplements.  Hypertension This is a chronic problem. The problem is controlled. Pertinent negatives include no chest pain, headaches or shortness of breath. Past treatments include diuretics and ACE inhibitors. The current treatment provides significant improvement.    Review of Systems  Constitutional: Negative for chills, fatigue and fever.  Respiratory: Negative for chest tightness and shortness of breath.   Cardiovascular: Negative for chest pain.  Musculoskeletal: Positive for arthralgias, gait problem and myalgias.  Neurological: Positive for numbness (tingling in feet at night). Negative for dizziness, light-headedness and headaches.    Patient Active Problem List   Diagnosis Date Noted  . Carotid artery stenosis 10/16/2018  . Current moderate episode of major depressive disorder without prior episode ([place]) 08/20/2018  . B12 nutritional deficiency 08/20/2018  . Neuropathy 06/06/2017  . Hearing loss of right ear 06/06/2017  . Gait instability 03/27/2017  . Muscle fasciculation 03/27/2017  . Gastroesophageal reflux disease 03/27/2017  . OAB (overactive bladder) 05/22/2016  . Cataract of right eye 01/19/2016  . Eversion of the eyelid 01/19/2016  . Neoplasm of uncertain behavior of skin 01/19/2016  . Thalamic infarction ([place]) 11/16/2015  . Aneurysm, cerebral, nonruptured 01/04/2015  . Essential  (primary) hypertension 01/04/2015  . Mixed hyperlipidemia 01/04/2015  . Idiopathic insomnia 01/04/2015  . Special screening for malignant neoplasms, colon 01/04/2015    No Known Allergies  Past Surgical History:  Procedure Laterality Date  . ABDOMINAL HERNIA REPAIR    . CATARACT EXTRACTION W/PHACO Right 06/24/2016   Procedure: CATARACT EXTRACTION PHACO AND INTRAOCULAR LENS PLACEMENT (IOC);  Surgeon: Ronnell Freshwater, MD;  Location: [place];  Service: Ophthalmology;  Laterality: Right;  RIGHT  . CHOLECYSTECTOMY    . IR GENERIC HISTORICAL  01/12/2016   IR ANGIO INTRA EXTRACRAN SEL INTERNAL CAROTID BILAT MOD SED 01/12/2016 Consuella Lose, MD [facility]  . IR GENERIC HISTORICAL  01/12/2016   IR ANGIO VERTEBRAL SEL VERTEBRAL BILAT MOD SED 01/12/2016 Consuella Lose, MD [facility]  . IR GENERIC HISTORICAL  01/12/2016   IR 3D INDEPENDENT WKST 01/12/2016 Consuella Lose, MD [facility]  . REPLACEMENT TOTAL KNEE Bilateral   . TOTAL ABDOMINAL HYSTERECTOMY      Social History   Tobacco Use  . Smoking status: Never Smoker  . Smokeless tobacco: Never Used  . Tobacco comment: smoking cessation materials not required  Substance Use Topics  . Alcohol use: Yes    Alcohol/week: 1.0 standard drinks    Types: 1 Cans of beer per week    Comment:    . Drug use: No     Medication list has been reviewed and updated.  Current Meds  Medication Sig  . aspirin EC 325 MG tablet Take 325 mg by mouth daily.   Marland Kitchen atorvastatin (LIPITOR) 20 MG tablet TAKE  1 TABLET (20 MG TOTAL) BY MOUTH DAILY AT 6 PM.  . latanoprost (XALATAN) 0.005 % ophthalmic solution 1 drop at bedtime.  Marland Kitchen lisinopril-hydrochlorothiazide (ZESTORETIC) 10-12.5 MG tablet Take 1 tablet by mouth daily.  . traZODone (DESYREL) 50 MG tablet TAKE ONE TABLET BY MOUTH AT BEDTIME    PHQ 2/9 Scores 02/26/2019 01/28/2019 10/16/2018 08/20/2018  PHQ - 2 Score 0 0 0 2  PHQ- 9 Score 0 5 4 9     BP Readings from Last 3  Encounters:  02/26/19 136/64  01/28/19 132/68  01/18/19 (!) 171/76    Physical Exam Vitals signs and nursing note reviewed.  Constitutional:      General: She is not in acute distress.    Appearance: She is well-developed.  HENT:     Head: Normocephalic and atraumatic.  Cardiovascular:     Rate and Rhythm: Normal rate and regular rhythm.     Pulses: Normal pulses.  Pulmonary:     Effort: Pulmonary effort is normal. No respiratory distress.     Breath sounds: No wheezing or rhonchi.  Musculoskeletal:     Right knee: She exhibits decreased range of motion.     Left knee: She exhibits decreased range of motion.     Lumbar back: She exhibits decreased range of motion.     Right lower leg: No edema.     Left lower leg: No edema.  Lymphadenopathy:     Cervical: No cervical adenopathy.  Skin:    General: Skin is warm and dry.     Findings: No rash.  Neurological:     Mental Status: She is alert and oriented to person, place, and time.  Psychiatric:        Behavior: Behavior normal.        Thought Content: Thought content normal.     Wt Readings from Last 3 Encounters:  02/26/19 186 lb (84.4 kg)  01/28/19 184 lb (83.5 kg)  01/18/19 183 lb 3.2 oz (83.1 kg)    BP 136/64   Pulse 71   Temp 97.6 F (36.4 C) (Oral)   Ht 5' 3.5" (1.613 m)   Wt 186 lb (84.4 kg)   SpO2 97%   BMI 32.43 kg/m   Assessment and Plan: 1. Gait instability - Ambulatory referral to Physical Therapy  2. Essential (primary) hypertension controlled  3. B12 nutritional deficiency Resume daily B12 supplements   Partially dictated using Editor, commissioning. Any errors are unintentional.  Halina Maidens, MD [facility]  02/26/2019

## 2019-03-09 DIAGNOSIS — H2512 Age-related nuclear cataract, left eye: Secondary | ICD-10-CM | POA: Diagnosis not present

## 2019-03-10 ENCOUNTER — Encounter: Payer: Self-pay | Admitting: Physical Therapy

## 2019-03-10 ENCOUNTER — Other Ambulatory Visit: Payer: Self-pay

## 2019-03-10 ENCOUNTER — Ambulatory Visit: Payer: Medicare Other | Attending: Internal Medicine | Admitting: Physical Therapy

## 2019-03-10 DIAGNOSIS — Z9181 History of falling: Secondary | ICD-10-CM | POA: Insufficient documentation

## 2019-03-10 DIAGNOSIS — R262 Difficulty in walking, not elsewhere classified: Secondary | ICD-10-CM | POA: Diagnosis not present

## 2019-03-10 DIAGNOSIS — M6281 Muscle weakness (generalized): Secondary | ICD-10-CM | POA: Diagnosis not present

## 2019-03-10 NOTE — Therapy (Signed)
[place] [facility] [address] [place], [place], [address] Phone: [phone]   Fax:  [phone]  Physical Therapy Evaluation  Patient Details  Name: Bonnie Jackson MRN: [id number] Date of Birth: [birth year] Referring Provider (PT): Dr. Army Melia   Encounter Date: 03/10/2019  PT End of Session - 03/10/19 1006    Visit Number  1    Number of Visits  8    Date for PT Re-Evaluation  04/07/19    Authorization - Visit Number  1    Authorization - Number of Visits  10    PT Start Time  0820    PT Stop Time  0928    PT Time Calculation (min)  68 min    Equipment Utilized During Treatment  Gait belt    Activity Tolerance  Patient tolerated treatment well    Behavior During Therapy  [facility] for tasks assessed/performed       Past Medical History:  Diagnosis Date  . Brain aneurysm   . Cataract    right  . CVA (cerebral infarction)    no deficits  . Hypertension     Past Surgical History:  Procedure Laterality Date  . ABDOMINAL HERNIA REPAIR    . CATARACT EXTRACTION W/PHACO Right 06/24/2016   Procedure: CATARACT EXTRACTION PHACO AND INTRAOCULAR LENS PLACEMENT (IOC);  Surgeon: Ronnell Freshwater, MD;  Location: [place];  Service: Ophthalmology;  Laterality: Right;  RIGHT  . CHOLECYSTECTOMY    . IR GENERIC HISTORICAL  01/12/2016   IR ANGIO INTRA EXTRACRAN SEL INTERNAL CAROTID BILAT MOD SED 01/12/2016 Consuella Lose, MD [facility]  . IR GENERIC HISTORICAL  01/12/2016   IR ANGIO VERTEBRAL SEL VERTEBRAL BILAT MOD SED 01/12/2016 Consuella Lose, MD [facility]  . IR GENERIC HISTORICAL  01/12/2016   IR 3D INDEPENDENT WKST 01/12/2016 Consuella Lose, MD [facility]  . REPLACEMENT TOTAL KNEE Bilateral   . TOTAL ABDOMINAL HYSTERECTOMY      There were no vitals filed for this visit.   Subjective Assessment - 03/10/19 1003    Subjective  Pt reports no issues with pain and no dizziness or decreased vision. Pt reports no falls but  states she is "very, very careful," and that FOF and instability limits her from gardening and performing other outdoor ADLs. Pt reports her main concern is feeling of instability/balance and instability in her LEs. Pt reports she does not feel weak. Pt states she feels a "vibration in my head that sounds like blood gushing around" at night that wakes her up. Pt reports that it goes away when she gets up. Pt reports no headaches. Pt reports she followed up with her neurosurgeon about it and that doctor told her it was unrelated to past aneurysm. Pt reports on issues with BP (see today's vitals). Pt reports she does a few home exercises every day, but she must hold onto something for balance. Pt report she lives in one level home alone and that she has a ramp, which helps her with her ADLs. Pt reports she has a dog. Pt reports she avoids using stairs. Pt reports she has to use stairs when she visits son and needs to improve balance with stairs. Pt reports she avoids all activities where she is fearful of falling. Pt wants to be able to live independently and is worried she will lose her independence if she "falls and breaks my hip." Pt reports she fell 2 years ago around Christmas when she was gardening. Pt  reports her feet "got tangled up in vines" and that someone passing by helped her get back up. Pt reports sit<>stand are difficult from her couch.    Limitations  Walking;House hold activities;Standing    Patient Stated Goals  Improve walking/ balance    Currently in Pain?  No/denies         [facility] PT Assessment - 03/10/19 0001      Assessment   Medical Diagnosis  Gait instability    Referring Provider (PT)  Dr. Army Melia    Onset Date/Surgical Date  08/05/18    Prior Therapy  yes (see notes)      Precautions   Precautions  [place]  Private residence    [place] entrance      Prior Function   Level of  Independence  Independent with community mobility with device        Blood pressure: supine:  188/68 seated: 188/68, HR    LE MMT: LE grossly  5/5 except hip flexion B 4/5, dorsiflexion B 4+/5 and hip adduction B 4+/5  LE ROM WFL  SLR 10x B   GAIT: Pt ambulates with SPC, sways to the left, decreased gait speed.  SPECIAL TESTS TUG: 13.67 Modified CTSIB: Increased sway with EC, unable to maintain balance standing on foam with EC. Maintains balance EO both level surface and foam. Berg 46/56 10MWT: 0.8 m/s   Therapeutic exercise: Seated dorsiflexion 1x20 B Seated marches  PT Education - 03/11/19 0732    Education Details  See HEP    Person(s) Educated  Patient    Methods  Explanation;Demonstration    Comprehension  Verbalized understanding;Returned demonstration          PT Long Term Goals - 03/10/19 1241      PT LONG TERM GOAL #1   Title  Pt FOTO score will increase to 63 to indicate improved functional mobility.    Baseline  Pt FOTO score 54 03/10/2019    Time  4    Period  Weeks    Status  New    Target Date  04/07/19      PT LONG TERM GOAL #2   Title  Pt Berg Score will increase to at least 52/56 to demonstrate pt is at a low risk for falls    Baseline  Pt Berg score is 46/56 03/10/2019    Time  4    Period  Weeks    Status  New    Target Date  04/07/19      PT LONG TERM GOAL #3   Title  Pt will increase gait speed on 10MWT to 1.2 m/s to increase pt's ease with community activity and crossing the street safely.    Baseline  Pt 10MWT is 0.8 m/s 03/10/2019    Time  4    Period  Weeks    Status  New    Target Date  04/07/19      PT LONG TERM GOAL #4   Title  Pt will increase B hip flexion, B hip adduction and B dorsiflexion by at least  a MMT grade to improve gait mechanics.    Baseline  MMT: : LE grossly  5/5 except hip flexion B 4/5, dorsiflexion B 4+/5 and hip adduction B 4+/5 03/10/2019    Time  4    Period  Weeks    Status  New  Target Date  04/07/19         Plan - 03/10/19 0950    Clinical Impression Statement  Pt is a pleasant 78 y/o female presenting to therapy with complaints of decreased balance/feelings of instability in her LEs. Pt MMT revealed LE grossly 5/5 except B hip flexion 4/5, B dorsiflexion 4+/5 and B hip adduction 4+/5. LE ROM is WFL.  Pt FOTO score is 49 (age norm is 55). Pt Berg score is 46/56 indicating pt should use SPC and is at a moderate risk for falls. Pt had most difficulty with SLB, alternating toe taps, and with 360 degree turns demonstrating increased sway, and slight LOB that pt corrected independently with UE support. Pt with greater postural stability on R LE compared to L. Pt performed TUG in 13.67 seconds, where age norm is 8-10 seconds for community dwelling older females. Pt with increased sway with EC standing on level surface, and unable to maintain balance with EC on foam without UE support and CGA-min assist from SPT on modified CTSIB, suggestive of vestibular dysfunction. Pt 10MWT is 0.8 m/s indicating pt is on the threshold between limited community ambulator and community ambulator. Pt ambulates with SPC and has increased sway to L and decreased gait speed and increased difficulty crossing thresholds. Pt will benefit from further skilled therapy to improve LE strength, improve balance and decrease risk of falls.    Clinical Decision Making  Moderate    Rehab Potential  Good    PT Frequency  2x / week    PT Duration  4 weeks    PT Treatment/Interventions  ADLs/Self Care Home Management;Aquatic Therapy;Functional mobility training;Stair training;Gait training;Therapeutic activities;Therapeutic exercise;Balance training;Patient/family education;Neuromuscular re-education    PT Next Visit Plan  Increase B LE muscle strength/ progress ex. program to more gym based/ aquatic ex.      PT Home Exercise Plan  HEP: seated or standing marches with UE support, seate DF    Consulted and Agree with Plan of Care  Patient        Patient will benefit from skilled therapeutic intervention in order to improve the following deficits and impairments:  Abnormal gait, Decreased endurance, Decreased activity tolerance, Decreased strength, Difficulty walking, Decreased balance, Decreased mobility, Decreased coordination, Postural dysfunction, Improper body mechanics, Decreased range of motion  Visit Diagnosis: 1. Difficulty in walking, not elsewhere classified   2. Risk for falls   3. Muscle weakness (generalized)        Problem List Patient Active Problem List   Diagnosis Date Noted  . Carotid artery stenosis 10/16/2018  . Current moderate episode of major depressive disorder without prior episode ([place]) 08/20/2018  . B12 nutritional deficiency 08/20/2018  . Neuropathy 06/06/2017  . Hearing loss of right ear 06/06/2017  . Gait instability 03/27/2017  . Muscle fasciculation 03/27/2017  . Gastroesophageal reflux disease 03/27/2017  . OAB (overactive bladder) 05/22/2016  . Cataract of right eye 01/19/2016  . Eversion of the eyelid 01/19/2016  . Neoplasm of uncertain behavior of skin 01/19/2016  . Thalamic infarction ([place]) 11/16/2015  . Aneurysm, cerebral, nonruptured 01/04/2015  . Essential (primary) hypertension 01/04/2015  . Mixed hyperlipidemia 01/04/2015  . Idiopathic insomnia 01/04/2015  . Special screening for malignant neoplasms, colon 01/04/2015   Pura Spice, PT, DPT # [id number], SPT 03/11/2019, 7:54 AM  [facility] [address] [place], [place], [address] Phone: [phone]   Fax:  [phone]  Name: Bonnie Jackson MRN: [id number] Date of  Birth: 1940-12-03

## 2019-03-10 NOTE — Patient Instructions (Signed)
Access Code: [email or web address]  URL: [email or web address]  Date: 03/10/2019  Prepared by: Dorcas Carrow   Exercises  Standing March with Unilateral Counter Support - 10 reps - 3 sets - 1x daily - 4x weekly  Seated Toe Raise - 10 reps - 3 sets - 1x daily - 4x weekly

## 2019-03-15 ENCOUNTER — Encounter: Payer: Self-pay | Admitting: Physical Therapy

## 2019-03-15 ENCOUNTER — Ambulatory Visit: Payer: Medicare Other | Admitting: Physical Therapy

## 2019-03-15 ENCOUNTER — Other Ambulatory Visit: Payer: Self-pay

## 2019-03-15 DIAGNOSIS — M6281 Muscle weakness (generalized): Secondary | ICD-10-CM

## 2019-03-15 DIAGNOSIS — R262 Difficulty in walking, not elsewhere classified: Secondary | ICD-10-CM | POA: Diagnosis not present

## 2019-03-15 DIAGNOSIS — Z9181 History of falling: Secondary | ICD-10-CM | POA: Diagnosis not present

## 2019-03-15 NOTE — Therapy (Signed)
[facility] [address] [place], [place], [address] Phone: [phone]   Fax:  [phone]  Physical Therapy Treatment  Patient Details  Name: Bonnie Jackson MRN: [id number] Date of Birth: 07-17-1941 Referring Provider (PT): Dr. Army Melia   Encounter Date: 03/15/2019  PT End of Session - 03/15/19 0921    Visit Number  2    Number of Visits  8    Date for PT Re-Evaluation  04/07/19    Authorization - Visit Number  2    Authorization - Number of Visits  10    PT Start Time  0813    PT Stop Time  0907    PT Time Calculation (min)  54 min    Equipment Utilized During Treatment  Gait belt    Activity Tolerance  Patient tolerated treatment well    Behavior During Therapy  [facility] for tasks assessed/performed       Past Medical History:  Diagnosis Date  . Brain aneurysm   . Cataract    right  . CVA (cerebral infarction)    no deficits  . Hypertension     Past Surgical History:  Procedure Laterality Date  . ABDOMINAL HERNIA REPAIR    . CATARACT EXTRACTION W/PHACO Right 06/24/2016   Procedure: CATARACT EXTRACTION PHACO AND INTRAOCULAR LENS PLACEMENT (IOC);  Surgeon: Ronnell Freshwater, MD;  Location: [place];  Service: Ophthalmology;  Laterality: Right;  RIGHT  . CHOLECYSTECTOMY    . IR GENERIC HISTORICAL  01/12/2016   IR ANGIO INTRA EXTRACRAN SEL INTERNAL CAROTID BILAT MOD SED 01/12/2016 Consuella Lose, MD [facility]  . IR GENERIC HISTORICAL  01/12/2016   IR ANGIO VERTEBRAL SEL VERTEBRAL BILAT MOD SED 01/12/2016 Consuella Lose, MD [facility]  . IR GENERIC HISTORICAL  01/12/2016   IR 3D INDEPENDENT WKST 01/12/2016 Consuella Lose, MD [facility]  . REPLACEMENT TOTAL KNEE Bilateral   . TOTAL ABDOMINAL HYSTERECTOMY      There were no vitals filed for this visit.  Subjective Assessment - 03/15/19 0919    Subjective  Pt with no reports of pain. Pt reports difficulty sleeping. Pt reports she continues to feel  "vibrations in my head" but that the sx have not changed since previous session.    Limitations  Walking;House hold activities;Standing    Patient Stated Goals  Improve walking/ balance    Currently in Pain?  No/denies      Therapeutic exercise:  Nustep L6 10 min Stairs with B UE to unilateral UE support. Side approach due to limited B knee flexion descending. 4x Seated DF with manual resistance 20x B Pt fatigue greater on L LE than R Seated heel raises with 3# ankle weights 25x B Standing hip abduction  no weight 1x20 B Standing hip abduction with 5# ankle weights 2x8 B  Neuro:  Obstacle course around cones and over foam x6 Ambulating in clinic with vertical and horizontal head turns 4x each Step ups onto 6" step progressing from B UE support to unilateral UE support 20x Alternating toe taps on stairs ascending/descending 1x20 B   PT Long Term Goals - 03/10/19 1241      PT LONG TERM GOAL #1   Title  Pt FOTO score will increase to 63 to indicate improved functional mobility.    Baseline  Pt FOTO score 54 03/10/2019    Time  4    Period  Weeks    Status  New    Target Date  04/07/19  PT LONG TERM GOAL #2   Title  Pt Berg Score will increase to at least 52/56 to demonstrate pt is at a low risk for falls    Baseline  Pt Berg score is 46/56 03/10/2019    Time  4    Period  Weeks    Status  New    Target Date  04/07/19      PT LONG TERM GOAL #3   Title  Pt will increase gait speed on 10MWT to 1.2 m/s to increase pt's ease with community activity and crossing the street safely.    Baseline  Pt 10MWT is 0.8 m/s 03/10/2019    Time  4    Period  Weeks    Status  New    Target Date  04/07/19      PT LONG TERM GOAL #4   Title  Pt will increase B hip flexion, B hip adduction and B dorsiflexion by at least  a MMT grade to improve gait mechanics.    Baseline  MMT: : LE grossly  5/5 except hip flexion B 4/5, dorsiflexion B 4+/5 and hip adduction B 4+/5 03/10/2019    Time  4     Period  Weeks    Status  New    Target Date  04/07/19         Plan - 03/15/19 7564    Clinical Impression Statement  Pt self-limiting with balance/step-up exercises today with frequent reports of FOF. Pt with variable BOS when ambulating with vertical and horizontal head turns with SPC, and expressed greater difficulty with vertical headturns. Pt able to demonstrate gait without SPC on even surfaces without LOB and good technique, but required SPC when ambulating on and over uneven surfaces d/t decreased B ankle stability.  Pt reported fatigue with standing hip abduction and seated manually resisted dorsiflexion, where pt demonstrated decreased strength of L LE compared to R. Pt will continue to benefit from further skilled therapy to increase B LE strength and improve dynamic balance.    Stability/Clinical Decision Making  Stable/Uncomplicated    Clinical Decision Making  Moderate    Rehab Potential  Good    PT Frequency  2x / week    PT Duration  4 weeks    PT Treatment/Interventions  ADLs/Self Care Home Management;Aquatic Therapy;Functional mobility training;Stair training;Gait training;Therapeutic activities;Therapeutic exercise;Balance training;Patient/family education;Neuromuscular re-education    PT Next Visit Plan  Increase B LE muscle strength/ progress ex. program to more gym based/ aquatic ex.; perturbations forward/backward/laterlaly in // bars    PT Home Exercise Plan  HEP: seated or standing marches with UE support, seate DF    Consulted and Agree with Plan of Care  Patient       Patient will benefit from skilled therapeutic intervention in order to improve the following deficits and impairments:  Abnormal gait, Decreased endurance, Decreased activity tolerance, Decreased strength, Difficulty walking, Decreased balance, Decreased mobility, Decreased coordination, Postural dysfunction, Improper body mechanics, Decreased range of motion  Visit Diagnosis: 1. Difficulty in walking,  not elsewhere classified   2. Risk for falls   3. Muscle weakness (generalized)        Problem List Patient Active Problem List   Diagnosis Date Noted  . Carotid artery stenosis 10/16/2018  . Current moderate episode of major depressive disorder without prior episode ([place]) 08/20/2018  . B12 nutritional deficiency 08/20/2018  . Neuropathy 06/06/2017  . Hearing loss of right ear 06/06/2017  . Gait instability 03/27/2017  . Muscle  fasciculation 03/27/2017  . Gastroesophageal reflux disease 03/27/2017  . OAB (overactive bladder) 05/22/2016  . Cataract of right eye 01/19/2016  . Eversion of the eyelid 01/19/2016  . Neoplasm of uncertain behavior of skin 01/19/2016  . Thalamic infarction ([place]) 11/16/2015  . Aneurysm, cerebral, nonruptured 01/04/2015  . Essential (primary) hypertension 01/04/2015  . Mixed hyperlipidemia 01/04/2015  . Idiopathic insomnia 01/04/2015  . Special screening for malignant neoplasms, colon 01/04/2015   Pura Spice, PT, DPT # [id number], SPT 03/15/2019, 10:03 AM  [facility] [address] [place], [place], [address] Phone: [phone]   Fax:  [phone]  Name: Shantavia Jha MRN: [id number] Date of Birth: 09-25-1940

## 2019-03-17 ENCOUNTER — Encounter: Payer: Self-pay | Admitting: Physical Therapy

## 2019-03-17 ENCOUNTER — Ambulatory Visit: Payer: Medicare Other | Admitting: Physical Therapy

## 2019-03-17 ENCOUNTER — Other Ambulatory Visit: Payer: Self-pay

## 2019-03-17 DIAGNOSIS — R262 Difficulty in walking, not elsewhere classified: Secondary | ICD-10-CM

## 2019-03-17 DIAGNOSIS — M6281 Muscle weakness (generalized): Secondary | ICD-10-CM | POA: Diagnosis not present

## 2019-03-17 DIAGNOSIS — Z9181 History of falling: Secondary | ICD-10-CM

## 2019-03-17 NOTE — Therapy (Signed)
[facility] [address] [place], [place], [address] Phone: [phone]   Fax:  [phone]  Physical Therapy Treatment  Patient Details  Name: Bonnie Jackson MRN: [id number] Date of Birth: [birth year] Referring Provider (PT): Dr. Army Melia   Encounter Date: 03/17/2019  PT End of Session - 03/17/19 1107    Visit Number  3    Number of Visits  8    Date for PT Re-Evaluation  04/07/19    Authorization - Visit Number  3    Authorization - Number of Visits  10    PT Start Time  0817    PT Stop Time  0913    PT Time Calculation (min)  56 min    Equipment Utilized During Treatment  Gait belt    Activity Tolerance  Patient tolerated treatment well    Behavior During Therapy  [facility] for tasks assessed/performed       Past Medical History:  Diagnosis Date  . Brain aneurysm   . Cataract    right  . CVA (cerebral infarction)    no deficits  . Hypertension     Past Surgical History:  Procedure Laterality Date  . ABDOMINAL HERNIA REPAIR    . CATARACT EXTRACTION W/PHACO Right 06/24/2016   Procedure: CATARACT EXTRACTION PHACO AND INTRAOCULAR LENS PLACEMENT (IOC);  Surgeon: Ronnell Freshwater, MD;  Location: [place];  Service: Ophthalmology;  Laterality: Right;  RIGHT  . CHOLECYSTECTOMY    . IR GENERIC HISTORICAL  01/12/2016   IR ANGIO INTRA EXTRACRAN SEL INTERNAL CAROTID BILAT MOD SED 01/12/2016 Consuella Lose, MD [facility]-INTER[facility]  . IR GENERIC HISTORICAL  01/12/2016   IR ANGIO VERTEBRAL SEL VERTEBRAL BILAT MOD SED 01/12/2016 Consuella Lose, MD [facility]  . IR GENERIC HISTORICAL  01/12/2016   IR 3D INDEPENDENT WKST 01/12/2016 Consuella Lose, MD [facility]  . REPLACEMENT TOTAL KNEE Bilateral   . TOTAL ABDOMINAL HYSTERECTOMY      There were no vitals filed for this visit.  Subjective Assessment - 03/17/19 1102    Subjective  Pt reports no reports no pain. Pt reports performing HEP. Pt reports she attempted step ups at  home but discontinued because she did not like her set-up.    Limitations  Walking;House hold activities;Standing    Patient Stated Goals  Improve walking/ balance    Currently in Pain?  No/denies         Therapeutic Exercise:  Nustep L7, 10 min Seated marches 4# - 2x20 Seated heel raises with 4# ankle weights 2x20  Seated adductor squeeze - 15x with 3 second hold Seated RTB ankle eversion- 2x20 B  Neuro:  Standing on foam without UE support 2x30 sec. Cuing provided/pt with increased pronation L. Standing on foam feet together with intermittent UE support - 2x30 sec Standing on foam, EC, feet apart -2x30 sec Standing on foam, EC, feet together with intermittent UE support - 2x30 sec. Pt lists to L.  SLB with unilateral UE support 4x30 sec B LEs. Increased pronation noted on L LE. Ambulating around clinic and through parking lot/on grass with [facility]       PT Education - 03/17/19 1104    Education Details  Pt educated on role of ankle stability with balance/posture    Person(s) Educated  Patient    Methods  Explanation;Demonstration    Comprehension  Verbalized understanding;Returned demonstration          PT Long Term Goals - 03/10/19 1241  PT LONG TERM GOAL #1   Title  Pt FOTO score will increase to 63 to indicate improved functional mobility.    Baseline  Pt FOTO score 54 03/10/2019    Time  4    Period  Weeks    Status  New    Target Date  04/07/19      PT LONG TERM GOAL #2   Title  Pt Berg Score will increase to at least 52/56 to demonstrate pt is at a low risk for falls    Baseline  Pt Berg score is 46/56 03/10/2019    Time  4    Period  Weeks    Status  New    Target Date  04/07/19      PT LONG TERM GOAL #3   Title  Pt will increase gait speed on 10MWT to 1.2 m/s to increase pt's ease with community activity and crossing the street safely.    Baseline  Pt 10MWT is 0.8 m/s 03/10/2019    Time  4    Period  Weeks    Status  New    Target Date  04/07/19       PT LONG TERM GOAL #4   Title  Pt will increase B hip flexion, B hip adduction and B dorsiflexion by at least  a MMT grade to improve gait mechanics.    Baseline  MMT: : LE grossly  5/5 except hip flexion B 4/5, dorsiflexion B 4+/5 and hip adduction B 4+/5 03/10/2019    Time  4    Period  Weeks    Status  New    Target Date  04/07/19          Plan - 03/17/19 1109    Clinical Impression Statement  Pt demonstrates decreased stability with all standing foam balance exercises with feet together and with eyes closed. During balance exercises pt with tendency to list to left side, requiring intermittent UE support and CGA-min assist from SPT to regain balance. Pt also with increased pronation of L LE compared to right with SLB exercises and required frequent cuing for correct technique. Pt will continue to benefit from further skilled therapy to increase B LE strength and balance.    Personal Factors and Comorbidities  Age    Stability/Clinical Decision Making  Stable/Uncomplicated    Clinical Decision Making  Moderate    Rehab Potential  Good    PT Frequency  2x / week    PT Duration  4 weeks    PT Treatment/Interventions  ADLs/Self Care Home Management;Aquatic Therapy;Functional mobility training;Stair training;Gait training;Therapeutic activities;Therapeutic exercise;Balance training;Patient/family education;Neuromuscular re-education    PT Next Visit Plan  Progress static and dynamic balance exercises    PT Home Exercise Plan  HEP: seated or standing marches with UE support, seate DF    Consulted and Agree with Plan of Care  Patient       Patient will benefit from skilled therapeutic intervention in order to improve the following deficits and impairments:  Abnormal gait, Decreased endurance, Decreased activity tolerance, Decreased strength, Difficulty walking, Decreased balance, Decreased mobility, Decreased coordination, Postural dysfunction, Improper body mechanics, Decreased range of  motion  Visit Diagnosis: 1. Risk for falls   2. Muscle weakness (generalized)   3. Difficulty in walking, not elsewhere classified        Problem List Patient Active Problem List   Diagnosis Date Noted  . Carotid artery stenosis 10/16/2018  . Current moderate episode of major depressive disorder  without prior episode ([place]) 08/20/2018  . B12 nutritional deficiency 08/20/2018  . Neuropathy 06/06/2017  . Hearing loss of right ear 06/06/2017  . Gait instability 03/27/2017  . Muscle fasciculation 03/27/2017  . Gastroesophageal reflux disease 03/27/2017  . OAB (overactive bladder) 05/22/2016  . Cataract of right eye 01/19/2016  . Eversion of the eyelid 01/19/2016  . Neoplasm of uncertain behavior of skin 01/19/2016  . Thalamic infarction ([place]) 11/16/2015  . Aneurysm, cerebral, nonruptured 01/04/2015  . Essential (primary) hypertension 01/04/2015  . Mixed hyperlipidemia 01/04/2015  . Idiopathic insomnia 01/04/2015  . Special screening for malignant neoplasms, colon 01/04/2015   Pura Spice, PT, DPT # [address] SPT 03/18/2019, 10:34 AM  [facility] [address] [place], [place], [address] Phone: [phone]   Fax:  [phone]  Name: Bonnie Jackson MRN: [id number] Date of Birth: [birth year]

## 2019-03-22 ENCOUNTER — Other Ambulatory Visit: Payer: Self-pay

## 2019-03-22 ENCOUNTER — Ambulatory Visit: Payer: Medicare Other | Admitting: Physical Therapy

## 2019-03-22 ENCOUNTER — Encounter: Payer: Self-pay | Admitting: Physical Therapy

## 2019-03-22 DIAGNOSIS — M6281 Muscle weakness (generalized): Secondary | ICD-10-CM | POA: Diagnosis not present

## 2019-03-22 DIAGNOSIS — Z9181 History of falling: Secondary | ICD-10-CM | POA: Diagnosis not present

## 2019-03-22 DIAGNOSIS — R262 Difficulty in walking, not elsewhere classified: Secondary | ICD-10-CM

## 2019-03-22 NOTE — Therapy (Signed)
[facility] [address] [place], [place], [address] Phone: [phone]   Fax:  [phone]  Physical Therapy Treatment  Patient Details  Name: Bonnie Jackson MRN: [id number] Date of Birth: 03/27/1941 Referring Provider (PT): Dr. Army Melia   Encounter Date: 03/22/2019  PT End of Session - 03/22/19 1238    Visit Number  4    Number of Visits  8    Date for PT Re-Evaluation  04/07/19    Authorization - Visit Number  4    Authorization - Number of Visits  10    PT Start Time  0812    PT Stop Time  0858    PT Time Calculation (min)  46 min    Equipment Utilized During Treatment  Gait belt    Activity Tolerance  Patient tolerated treatment well    Behavior During Therapy  [facility] for tasks assessed/performed       Past Medical History:  Diagnosis Date  . Brain aneurysm   . Cataract    right  . CVA (cerebral infarction)    no deficits  . Hypertension     Past Surgical History:  Procedure Laterality Date  . ABDOMINAL HERNIA REPAIR    . CATARACT EXTRACTION W/PHACO Right 06/24/2016   Procedure: CATARACT EXTRACTION PHACO AND INTRAOCULAR LENS PLACEMENT (IOC);  Surgeon: Ronnell Freshwater, MD;  Location: [place];  Service: Ophthalmology;  Laterality: Right;  RIGHT  . CHOLECYSTECTOMY    . IR GENERIC HISTORICAL  01/12/2016   IR ANGIO INTRA EXTRACRAN SEL INTERNAL CAROTID BILAT MOD SED 01/12/2016 Consuella Lose, MD [facility]  . IR GENERIC HISTORICAL  01/12/2016   IR ANGIO VERTEBRAL SEL VERTEBRAL BILAT MOD SED 01/12/2016 Consuella Lose, MD [facility]  . IR GENERIC HISTORICAL  01/12/2016   IR 3D INDEPENDENT WKST 01/12/2016 Consuella Lose, MD [facility]  . REPLACEMENT TOTAL KNEE Bilateral   . TOTAL ABDOMINAL HYSTERECTOMY      There were no vitals filed for this visit.  Subjective Assessment - 03/22/19 1236    Subjective  Pt reports no pain today, but that she had trouble with R ankle stability after previous session.   Pt reports she got rid of her other shoes because she thought they might have been contributing to R ankle instability.    Limitations  Walking;House hold activities;Standing    Patient Stated Goals  Improve walking/ balance    Currently in Pain?  No/denies       Therapeutic Exercise:  Nustep L7, 10 min Seated ball squeezes 2x10 with 3 sec isometric. Cuing provided for technique Seated manually resisted dorsiflexion 2x10 B LEs Standing marches in // bars forward/backward x10 each. Cuing provided for technique.   Neuro: Gait with SPC,  horizontal head turns 4x Gait with SPC, vertical head turns 4x Side stepping on foam with UE support x several repetitions  Standing on foam and wobble board, UE support - 2x30 sec pt with increased sway, cuing provided for technique and for weight/shift  Standing on foam with feet together, EC, intermittent UE support - 2x30 sec. Cuing provided for weight shift        PT Education - 03/22/19 1237    Education Details  Pt educated on exercise technqiue with forward/backward marches in // bars    Person(s) Educated  Patient    Methods  Explanation;Demonstration    Comprehension  Verbalized understanding;Returned demonstration          PT Long Term Goals -  03/10/19 1241      PT LONG TERM GOAL #1   Title  Pt FOTO score will increase to 63 to indicate improved functional mobility.    Baseline  Pt FOTO score 54 03/10/2019    Time  4    Period  Weeks    Status  New    Target Date  04/07/19      PT LONG TERM GOAL #2   Title  Pt Berg Score will increase to at least 52/56 to demonstrate pt is at a low risk for falls    Baseline  Pt Berg score is 46/56 03/10/2019    Time  4    Period  Weeks    Status  New    Target Date  04/07/19      PT LONG TERM GOAL #3   Title  Pt will increase gait speed on 10MWT to 1.2 m/s to increase pt's ease with community activity and crossing the street safely.    Baseline  Pt 10MWT is 0.8 m/s 03/10/2019    Time  4     Period  Weeks    Status  New    Target Date  04/07/19      PT LONG TERM GOAL #4   Title  Pt will increase B hip flexion, B hip adduction and B dorsiflexion by at least  a MMT grade to improve gait mechanics.    Baseline  MMT: : LE grossly  5/5 except hip flexion B 4/5, dorsiflexion B 4+/5 and hip adduction B 4+/5 03/10/2019    Time  4    Period  Weeks    Status  New    Target Date  04/07/19            Plan - 03/22/19 1239    Clinical Impression Statement  Pt had difficulty increasing L hip flexion to equal R hip flexion with standing forward/backward marches in // bars and also demonstrated increased L LE adduction. Pt was able to correct technique with minor verbal cues, but her L LE fatigued quickly. Pt also demonstrates compensation with L hip ERs while lateral stepping in // bars. Pt also required cuing to shift weight more onto lateral L foot with standing foam exercises to reduce excessive L foot pronation and improve stability of L ankle for balance. Pt will continue to benefit from further skilled therapy to increase LE strength and decrease fall risk.    Personal Factors and Comorbidities  Age    Stability/Clinical Decision Making  Stable/Uncomplicated    Clinical Decision Making  Moderate    Rehab Potential  Good    PT Frequency  2x / week    PT Duration  4 weeks    PT Treatment/Interventions  ADLs/Self Care Home Management;Aquatic Therapy;Functional mobility training;Stair training;Gait training;Therapeutic activities;Therapeutic exercise;Balance training;Patient/family education;Neuromuscular re-education    PT Next Visit Plan  Progress static and dynamic balance exercises; new HEP    PT Home Exercise Plan  HEP: seated or standing marches with UE support, seate DF    Consulted and Agree with Plan of Care  Patient       Patient will benefit from skilled therapeutic intervention in order to improve the following deficits and impairments:  Abnormal gait, Decreased  endurance, Decreased activity tolerance, Decreased strength, Difficulty walking, Decreased balance, Decreased mobility, Decreased coordination, Postural dysfunction, Improper body mechanics, Decreased range of motion  Visit Diagnosis: 1. Risk for falls   2. Muscle weakness (generalized)   3. Difficulty in  walking, not elsewhere classified        Problem List Patient Active Problem List   Diagnosis Date Noted  . Carotid artery stenosis 10/16/2018  . Current moderate episode of major depressive disorder without prior episode ([place]) 08/20/2018  . B12 nutritional deficiency 08/20/2018  . Neuropathy 06/06/2017  . Hearing loss of right ear 06/06/2017  . Gait instability 03/27/2017  . Muscle fasciculation 03/27/2017  . Gastroesophageal reflux disease 03/27/2017  . OAB (overactive bladder) 05/22/2016  . Cataract of right eye 01/19/2016  . Eversion of the eyelid 01/19/2016  . Neoplasm of uncertain behavior of skin 01/19/2016  . Thalamic infarction ([place]) 11/16/2015  . Aneurysm, cerebral, nonruptured 01/04/2015  . Essential (primary) hypertension 01/04/2015  . Mixed hyperlipidemia 01/04/2015  . Idiopathic insomnia 01/04/2015  . Special screening for malignant neoplasms, colon 01/04/2015   Pura Spice, PT, DPT # [id number] , SPT 03/22/2019, 12:44 PM  [place] [address] [place], [place], [address] Phone: [phone]   Fax:  [phone]  Name: Tameisha Covell MRN: [id number] Date of Birth: 25-Aug-1940

## 2019-03-24 ENCOUNTER — Other Ambulatory Visit: Payer: Self-pay

## 2019-03-24 ENCOUNTER — Ambulatory Visit: Payer: Medicare Other | Admitting: Physical Therapy

## 2019-03-24 ENCOUNTER — Encounter: Payer: Self-pay | Admitting: Physical Therapy

## 2019-03-24 DIAGNOSIS — R262 Difficulty in walking, not elsewhere classified: Secondary | ICD-10-CM | POA: Diagnosis not present

## 2019-03-24 DIAGNOSIS — M6281 Muscle weakness (generalized): Secondary | ICD-10-CM | POA: Diagnosis not present

## 2019-03-24 DIAGNOSIS — Z9181 History of falling: Secondary | ICD-10-CM | POA: Diagnosis not present

## 2019-03-24 NOTE — Therapy (Signed)
[facility] [address] [place], [place], [address] Phone: [phone]   Fax:  [phone]  Physical Therapy Treatment  Patient Details  Name: Bonnie Jackson MRN: [id number] Date of Birth: 1940/08/13 Referring Provider (PT): Dr. Army Melia   Encounter Date: 03/24/2019  PT End of Session - 03/24/19 0952    Visit Number  5    Number of Visits  8    Date for PT Re-Evaluation  04/07/19    Authorization - Visit Number  5    Authorization - Number of Visits  10    PT Start Time  [phone]    PT Stop Time  0900    PT Time Calculation (min)  46 min    Equipment Utilized During Treatment  Gait belt    Activity Tolerance  Patient tolerated treatment well    Behavior During Therapy  [facility] for tasks assessed/performed       Past Medical History:  Diagnosis Date  . Brain aneurysm   . Cataract    right  . CVA (cerebral infarction)    no deficits  . Hypertension     Past Surgical History:  Procedure Laterality Date  . ABDOMINAL HERNIA REPAIR    . CATARACT EXTRACTION W/PHACO Right 06/24/2016   Procedure: CATARACT EXTRACTION PHACO AND INTRAOCULAR LENS PLACEMENT (IOC);  Surgeon: Ronnell Freshwater, MD;  Location: [place];  Service: Ophthalmology;  Laterality: Right;  RIGHT  . CHOLECYSTECTOMY    . IR GENERIC HISTORICAL  01/12/2016   IR ANGIO INTRA EXTRACRAN SEL INTERNAL CAROTID BILAT MOD SED 01/12/2016 Consuella Lose, MD [facility]  . IR GENERIC HISTORICAL  01/12/2016   IR ANGIO VERTEBRAL SEL VERTEBRAL BILAT MOD SED 01/12/2016 Consuella Lose, MD [facility]  . IR GENERIC HISTORICAL  01/12/2016   IR 3D INDEPENDENT WKST 01/12/2016 Consuella Lose, MD [facility]INTER[facility]  . REPLACEMENT TOTAL KNEE Bilateral   . TOTAL ABDOMINAL HYSTERECTOMY      There were no vitals filed for this visit.  Subjective Assessment - 03/24/19 0950    Subjective  Pt reports she did "a lot of walking" yesterday and bought new sneakers. Pt reports no pain  today. Pt states she is concerned about" being too confident" with balance because she might attempt activities where she'd lose balance.    Limitations  Walking;House hold activities;Standing    Patient Stated Goals  Improve walking/ balance    Currently in Pain?  No/denies         Therapeutic Exercise:  Nustep L7, 10 min Seated Hip flexion 2x20 Standing Hip abduction 2x20. Cuing provided for technique Standing Hip extension 2x20.. Cuing provided for technique   Neuro Gait with head turns vertical/horizontal through clinic hallway, with and without SPC- 4x each Gait with alternating knee taps in // bars - 2x10. Pt with frequent stops, intermittent UE support, and reports of FOF although pt without LOB. Gait with cognitive task Stepping strategies backward with intermittent UE support- 20x. Cuing provided for technique.      PT Long Term Goals - 03/10/19 1241      PT LONG TERM GOAL #1   Title  Pt FOTO score will increase to 63 to indicate improved functional mobility.    Baseline  Pt FOTO score 54 03/10/2019    Time  4    Period  Weeks    Status  New    Target Date  04/07/19      PT LONG TERM GOAL #2  Title  Pt Berg Score will increase to at least 52/56 to demonstrate pt is at a low risk for falls    Baseline  Pt Berg score is 46/56 03/10/2019    Time  4    Period  Weeks    Status  New    Target Date  04/07/19      PT LONG TERM GOAL #3   Title  Pt will increase gait speed on 10MWT to 1.2 m/s to increase pt's ease with community activity and crossing the street safely.    Baseline  Pt 10MWT is 0.8 m/s 03/10/2019    Time  4    Period  Weeks    Status  New    Target Date  04/07/19      PT LONG TERM GOAL #4   Title  Pt will increase B hip flexion, B hip adduction and B dorsiflexion by at least  a MMT grade to improve gait mechanics.    Baseline  MMT: : LE grossly  5/5 except hip flexion B 4/5, dorsiflexion B 4+/5 and hip adduction B 4+/5 03/10/2019    Time  4    Period   Weeks    Status  New    Target Date  04/07/19      Plan - 03/24/19 0953    Clinical Impression Statement  Pt self-limited with dynamic balance exercises secondary to reports of fear of falling, although pt without LOB during exercises. Pt did demonstrate variable BOS with gait with vertical head turns with and without SPC and deviated from straight path to R side. Pt also cued for equal weight shift and increased stance time onto L LE with gait. Pt had delayed posterior stepping strategy and required max cues to increase step length posteriorly and reaction time. Pt will benefit from further skilled therapy to improve balance and decrease risk of falls.    Personal Factors and Comorbidities  Age    Stability/Clinical Decision Making  Stable/Uncomplicated    Clinical Decision Making  Moderate    Rehab Potential  Good    PT Frequency  2x / week    PT Duration  4 weeks    PT Treatment/Interventions  ADLs/Self Care Home Management;Aquatic Therapy;Functional mobility training;Stair training;Gait training;Therapeutic activities;Therapeutic exercise;Balance training;Patient/family education;Neuromuscular re-education    PT Next Visit Plan  New HEP, progress stepping strategies    PT Home Exercise Plan  HEP: seated or standing marches with UE support, seate DF    Consulted and Agree with Plan of Care  Patient       Patient will benefit from skilled therapeutic intervention in order to improve the following deficits and impairments:  Abnormal gait, Decreased endurance, Decreased activity tolerance, Decreased strength, Difficulty walking, Decreased balance, Decreased mobility, Decreased coordination, Postural dysfunction, Improper body mechanics, Decreased range of motion  Visit Diagnosis: 1. Risk for falls   2. Muscle weakness (generalized)   3. Difficulty in walking, not elsewhere classified        Problem List Patient Active Problem List   Diagnosis Date Noted  . Carotid artery stenosis  10/16/2018  . Current moderate episode of major depressive disorder without prior episode ([place]) 08/20/2018  . B12 nutritional deficiency 08/20/2018  . Neuropathy 06/06/2017  . Hearing loss of right ear 06/06/2017  . Gait instability 03/27/2017  . Muscle fasciculation 03/27/2017  . Gastroesophageal reflux disease 03/27/2017  . OAB (overactive bladder) 05/22/2016  . Cataract of right eye 01/19/2016  . Eversion of the eyelid 01/19/2016  .  Neoplasm of uncertain behavior of skin 01/19/2016  . Thalamic infarction ([place]) 11/16/2015  . Aneurysm, cerebral, nonruptured 01/04/2015  . Essential (primary) hypertension 01/04/2015  . Mixed hyperlipidemia 01/04/2015  . Idiopathic insomnia 01/04/2015  . Special screening for malignant neoplasms, colon 01/04/2015   Pura Spice, PT, DPT # [id number] SPT 03/24/2019, 9:55 AM  [place] [facility] [address] [place], [place], [address] Phone: [phone]   Fax:  [phone]  Name: Wesleigh Markovic MRN: [id number] Date of Birth: Nov 01, 1940

## 2019-03-29 ENCOUNTER — Encounter: Payer: Medicare Other | Admitting: Physical Therapy

## 2019-03-31 ENCOUNTER — Ambulatory Visit: Payer: Medicare Other

## 2019-04-01 ENCOUNTER — Encounter: Payer: Self-pay | Admitting: Physical Therapy

## 2019-04-01 ENCOUNTER — Ambulatory Visit: Payer: Medicare Other | Admitting: Physical Therapy

## 2019-04-01 ENCOUNTER — Other Ambulatory Visit: Payer: Self-pay

## 2019-04-01 DIAGNOSIS — Z9181 History of falling: Secondary | ICD-10-CM

## 2019-04-01 DIAGNOSIS — R262 Difficulty in walking, not elsewhere classified: Secondary | ICD-10-CM | POA: Diagnosis not present

## 2019-04-01 DIAGNOSIS — M6281 Muscle weakness (generalized): Secondary | ICD-10-CM | POA: Diagnosis not present

## 2019-04-01 NOTE — Therapy (Signed)
[place] [facility] [address] [place], [place], [address] Phone: [phone]   Fax:  [phone]  Physical Therapy Treatment  Patient Details  Name: Bonnie Jackson MRN: [id number] Date of Birth: [birth year] Referring Provider (PT): Dr. Army Melia   Encounter Date: 04/01/2019  PT End of Session - 04/01/19 1242    Visit Number  6    Number of Visits  8    Date for PT Re-Evaluation  04/07/19    Authorization - Visit Number  6    Authorization - Number of Visits  10    PT Start Time  0901    PT Stop Time  0948    PT Time Calculation (min)  47 min    Equipment Utilized During Treatment  Gait belt    Activity Tolerance  Patient tolerated treatment well    Behavior During Therapy  [facility] for tasks assessed/performed       Past Medical History:  Diagnosis Date  . Brain aneurysm   . Cataract    right  . CVA (cerebral infarction)    no deficits  . Hypertension     Past Surgical History:  Procedure Laterality Date  . ABDOMINAL HERNIA REPAIR    . CATARACT EXTRACTION W/PHACO Right 06/24/2016   Procedure: CATARACT EXTRACTION PHACO AND INTRAOCULAR LENS PLACEMENT (IOC);  Surgeon: Ronnell Freshwater, MD;  Location: [place];  Service: Ophthalmology;  Laterality: Right;  RIGHT  . CHOLECYSTECTOMY    . IR GENERIC HISTORICAL  01/12/2016   IR ANGIO INTRA EXTRACRAN SEL INTERNAL CAROTID BILAT MOD SED 01/12/2016 Consuella Lose, MD [facility]-INTERV RAD  . IR GENERIC HISTORICAL  01/12/2016   IR ANGIO VERTEBRAL SEL VERTEBRAL BILAT MOD SED 01/12/2016 Consuella Lose, MD [facility]INTER[facility]  . IR GENERIC HISTORICAL  01/12/2016   IR 3D INDEPENDENT WKST 01/12/2016 Consuella Lose, MD [facility]INTER[facility]  . REPLACEMENT TOTAL KNEE Bilateral   . TOTAL ABDOMINAL HYSTERECTOMY      There were no vitals filed for this visit.  Subjective Assessment - 04/01/19 1008    Subjective  Pt reports difficulty with turning and that she typically loses her balance backwards. Pt reports  she's not getting around in her house as well. Pt reports no pain today but that B upper traps are sore.    Limitations  Walking;House hold activities;Standing    Patient Stated Goals  Improve walking/ balance    Currently in Pain?  No/denies        Therapeutic Exercise:   Nustep L6, 10 min  Neuro: Obstacle course figure-8s around cones, with and without SPC, with and without cone taps - ~10 min Quick turns in // bars with intermittent UE support - 20x. Pt with greater difficulty when L LE stance leg. 360 turns on foam with intermittent UE support - x multiple repetitions. Greater difficulty turning to R.  Cone taps without SPC and UE support in // bars - 20x. Pt with greater control with R LE as stance LE. Obstacle course over ankle weights and step, with SPC - 8x. Cuing provided for technique Single leg balance with B to unilateral UE support - 2x30 sec B      PT Long Term Goals - 03/10/19 1241      PT LONG TERM GOAL #1   Title  Pt FOTO score will increase to 63 to indicate improved functional mobility.    Baseline  Pt FOTO score 54 03/10/2019    Time  4    Period  Weeks    Status  New    Target Date  04/07/19      PT LONG TERM GOAL #2   Title  Pt Berg Score will increase to at least 52/56 to demonstrate pt is at a low risk for falls    Baseline  Pt Berg score is 46/56 03/10/2019    Time  4    Period  Weeks    Status  New    Target Date  04/07/19      PT LONG TERM GOAL #3   Title  Pt will increase gait speed on 10MWT to 1.2 m/s to increase pt's ease with community activity and crossing the street safely.    Baseline  Pt 10MWT is 0.8 m/s 03/10/2019    Time  4    Period  Weeks    Status  New    Target Date  04/07/19      PT LONG TERM GOAL #4   Title  Pt will increase B hip flexion, B hip adduction and B dorsiflexion by at least  a MMT grade to improve gait mechanics.    Baseline  MMT: : LE grossly  5/5 except hip flexion B 4/5, dorsiflexion B 4+/5 and hip adduction B  4+/5 03/10/2019    Time  4    Period  Weeks    Status  New    Target Date  04/07/19            Plan - 04/01/19 1006    Clinical Impression Statement  Pt demonstrates progress today with performing cone taps without SPC and with decreased instances of LOB. When pt did demonstrate slight LOB, pt was able to regain balance with using stepping strategy and without assistance from SPT. Pt has greater difficulty with maintaining postural stability when L LE was stance leg in all standing balance exercises. Pt required cuing for technique to decrease hesitation to step over obstacles, and pt's step length/clearance of obstacle and postural stability improved when pt did not hesitate, particularly when L LE was stance LE. Pt limited by FOF with SLB exercise and uses B UE to complete exercise. Pt will continue to benefit from further skilled therapy to improve balance and decrease risk of falls.    Personal Factors and Comorbidities  Age    Stability/Clinical Decision Making  Stable/Uncomplicated    Rehab Potential  Good    PT Frequency  2x / week    PT Duration  4 weeks    PT Treatment/Interventions  ADLs/Self Care Home Management;Aquatic Therapy;Functional mobility training;Stair training;Gait training;Therapeutic activities;Therapeutic exercise;Balance training;Patient/family education;Neuromuscular re-education    PT Next Visit Plan  New HEP, progress stepping strategies; progress SLB    PT Home Exercise Plan  HEP: seated or standing marches with UE support, seate DF    Consulted and Agree with Plan of Care  Patient       Patient will benefit from skilled therapeutic intervention in order to improve the following deficits and impairments:  Abnormal gait, Decreased endurance, Decreased activity tolerance, Decreased strength, Difficulty walking, Decreased balance, Decreased mobility, Decreased coordination, Postural dysfunction, Improper body mechanics, Decreased range of motion  Visit  Diagnosis: Muscle weakness (generalized)  Difficulty in walking, not elsewhere classified  Risk for falls     Problem List Patient Active Problem List   Diagnosis Date Noted  . Carotid artery stenosis 10/16/2018  . Current moderate episode of major depressive disorder without prior episode ([place]) 08/20/2018  . B12 nutritional deficiency 08/20/2018  .  Neuropathy 06/06/2017  . Hearing loss of right ear 06/06/2017  . Gait instability 03/27/2017  . Muscle fasciculation 03/27/2017  . Gastroesophageal reflux disease 03/27/2017  . OAB (overactive bladder) 05/22/2016  . Cataract of right eye 01/19/2016  . Eversion of the eyelid 01/19/2016  . Neoplasm of uncertain behavior of skin 01/19/2016  . Thalamic infarction ([place]) 11/16/2015  . Aneurysm, cerebral, nonruptured 01/04/2015  . Essential (primary) hypertension 01/04/2015  . Mixed hyperlipidemia 01/04/2015  . Idiopathic insomnia 01/04/2015  . Special screening for malignant neoplasms, colon 01/04/2015   Pura Spice, PT, DPT # [address] SPT 04/01/2019, 12:43 PM  [place] [facility] [address] [place], [place], [address] Phone: [phone]   Fax:  [phone]  Name: Taionna Mylin MRN: [id number] Date of Birth: [birth year]

## 2019-04-05 ENCOUNTER — Ambulatory Visit: Payer: Medicare Other

## 2019-04-05 ENCOUNTER — Ambulatory Visit: Payer: Medicare Other | Admitting: Physical Therapy

## 2019-04-05 ENCOUNTER — Encounter: Payer: Self-pay | Admitting: Physical Therapy

## 2019-04-05 ENCOUNTER — Other Ambulatory Visit: Payer: Self-pay

## 2019-04-05 DIAGNOSIS — M6281 Muscle weakness (generalized): Secondary | ICD-10-CM

## 2019-04-05 DIAGNOSIS — R262 Difficulty in walking, not elsewhere classified: Secondary | ICD-10-CM

## 2019-04-05 DIAGNOSIS — Z9181 History of falling: Secondary | ICD-10-CM

## 2019-04-05 NOTE — Therapy (Signed)
[facility] [address]. [place], [place], [address] Phone: [phone]   Fax:  [phone]  Physical Therapy Treatment  Patient Details  Name: Bonnie Jackson MRN: [id number] Date of Birth: 1940-11-09 Referring Provider (PT): Dr. Army Melia   Encounter Date: 04/05/2019  PT End of Session - 04/05/19 0906    Visit Number  7    Number of Visits  8    Date for PT Re-Evaluation  04/07/19    Authorization - Visit Number  7    Authorization - Number of Visits  10    PT Start Time  0809    PT Stop Time  0859    PT Time Calculation (min)  50 min    Equipment Utilized During Treatment  Gait belt    Activity Tolerance  Patient tolerated treatment well    Behavior During Therapy  [facility] for tasks assessed/performed       Past Medical History:  Diagnosis Date  . Brain aneurysm   . Cataract    right  . CVA (cerebral infarction)    no deficits  . Hypertension     Past Surgical History:  Procedure Laterality Date  . ABDOMINAL HERNIA REPAIR    . CATARACT EXTRACTION W/PHACO Right 06/24/2016   Procedure: CATARACT EXTRACTION PHACO AND INTRAOCULAR LENS PLACEMENT (IOC);  Surgeon: Ronnell Freshwater, MD;  Location: [place];  Service: Ophthalmology;  Laterality: Right;  RIGHT  . CHOLECYSTECTOMY    . IR GENERIC HISTORICAL  01/12/2016   IR ANGIO INTRA EXTRACRAN SEL INTERNAL CAROTID BILAT MOD SED 01/12/2016 Consuella Lose, MD [facility]  . IR GENERIC HISTORICAL  01/12/2016   IR ANGIO VERTEBRAL SEL VERTEBRAL BILAT MOD SED 01/12/2016 Consuella Lose, MD [facility]  . IR GENERIC HISTORICAL  01/12/2016   IR 3D INDEPENDENT WKST 01/12/2016 Consuella Lose, MD [facility]  . REPLACEMENT TOTAL KNEE Bilateral   . TOTAL ABDOMINAL HYSTERECTOMY      There were no vitals filed for this visit.  Subjective Assessment - 04/05/19 0905    Subjective  Pt reports talking to the ENT tomorrow. Pt reports no pain. Pt reports she went walking this  weekend.    Limitations  Walking;House hold activities;Standing    Patient Stated Goals  Improve walking/ balance    Currently in Pain?  No/denies       Therapeutic Exercise: Nustep L3, 10 min Seated dorsiflexion with 4# ankle weights and manual resistance - 2x20 R LE, 3x20 L LE Standing hip abduction with 4# ankle weights 3x20. Cuing for technique  Neuro: SLB in // bars with 6" step, stepping forward and across with step up, B LEs, progression from B to two fingers for UE support. 10x each SLB in // bars with 6" step, stepping laterally, to side, forward and backward, with step up 10x each Stairs with B to two finger support, ascending/descending. 4x. Cuing for technique. Pt reciprocal ascending, step-to descending with lateral approach. Forward and across, with step up and down on stairs, B to two finger support - 10 x B LEs    PT Education - 04/05/19 0906    Education Details  Pt educated on technique importance of increased repetitions for SLB training    Person(s) Educated  Patient    Methods  Explanation    Comprehension  Returned demonstration;Verbalized understanding          PT Long Term Goals - 03/10/19 1241      PT LONG TERM GOAL #  1   Title  Pt FOTO score will increase to 63 to indicate improved functional mobility.    Baseline  Pt FOTO score 54 03/10/2019    Time  4    Period  Weeks    Status  New    Target Date  04/07/19      PT LONG TERM GOAL #2   Title  Pt Berg Score will increase to at least 52/56 to demonstrate pt is at a low risk for falls    Baseline  Pt Berg score is 46/56 03/10/2019    Time  4    Period  Weeks    Status  New    Target Date  04/07/19      PT LONG TERM GOAL #3   Title  Pt will increase gait speed on 10MWT to 1.2 m/s to increase pt's ease with community activity and crossing the street safely.    Baseline  Pt 10MWT is 0.8 m/s 03/10/2019    Time  4    Period  Weeks    Status  New    Target Date  04/07/19      PT LONG TERM GOAL #4    Title  Pt will increase B hip flexion, B hip adduction and B dorsiflexion by at least  a MMT grade to improve gait mechanics.    Baseline  MMT: : LE grossly  5/5 except hip flexion B 4/5, dorsiflexion B 4+/5 and hip adduction B 4+/5 03/10/2019    Time  4    Period  Weeks    Status  New    Target Date  04/07/19            Plan - 04/05/19 [id number]    Clinical Impression Statement  Pt with decreased strength of L dorsiflexors compared to R with seated weighted and manually resisted dorsiflexion. SPT also increased focus on resisted dorsiflexion of L LE as pt demonstrated toe drag and decreased DF of L LE>R LE on SLB stair and step exercises.Pt tries to compensate with lateral trunk lean with standing hip abduction B and required cuing for upright posture/technique, indicating decreased strength of B hip abductors.  Pt will continue to benefit from further skilled therapy to improve B LE strength and balance.    Personal Factors and Comorbidities  Age    Stability/Clinical Decision Making  Stable/Uncomplicated    Clinical Decision Making  Moderate    Rehab Potential  Good    PT Frequency  2x / week    PT Duration  4 weeks    PT Treatment/Interventions  ADLs/Self Care Home Management;Aquatic Therapy;Functional mobility training;Stair training;Gait training;Therapeutic activities;Therapeutic exercise;Balance training;Patient/family education;Neuromuscular re-education    PT Next Visit Plan  New HEP, progress stepping strategies; progress SLB; REASSES GOALS    PT Home Exercise Plan  HEP: seated or standing marches with UE support, seate DF    Consulted and Agree with Plan of Care  Patient       Patient will benefit from skilled therapeutic intervention in order to improve the following deficits and impairments:  Abnormal gait, Decreased endurance, Decreased activity tolerance, Decreased strength, Difficulty walking, Decreased balance, Decreased mobility, Decreased coordination, Postural dysfunction,  Improper body mechanics, Decreased range of motion  Visit Diagnosis: Muscle weakness (generalized)  Difficulty in walking, not elsewhere classified  Risk for falls     Problem List Patient Active Problem List   Diagnosis Date Noted  . Carotid artery stenosis 10/16/2018  . Current moderate episode of  major depressive disorder without prior episode ([place]) 08/20/2018  . B12 nutritional deficiency 08/20/2018  . Neuropathy 06/06/2017  . Hearing loss of right ear 06/06/2017  . Gait instability 03/27/2017  . Muscle fasciculation 03/27/2017  . Gastroesophageal reflux disease 03/27/2017  . OAB (overactive bladder) 05/22/2016  . Cataract of right eye 01/19/2016  . Eversion of the eyelid 01/19/2016  . Neoplasm of uncertain behavior of skin 01/19/2016  . Thalamic infarction ([place]) 11/16/2015  . Aneurysm, cerebral, nonruptured 01/04/2015  . Essential (primary) hypertension 01/04/2015  . Mixed hyperlipidemia 01/04/2015  . Idiopathic insomnia 01/04/2015  . Special screening for malignant neoplasms, colon 01/04/2015   Pura Spice, PT, DPT # [id number], SPT 04/05/2019, 9:21 AM  [place] [facility] [address] [place], [place], [address] Phone: [phone]   Fax:  [phone]  Name: Bonnie Jackson MRN: [id number] Date of Birth: [birth year]

## 2019-04-06 DIAGNOSIS — R42 Dizziness and giddiness: Secondary | ICD-10-CM | POA: Diagnosis not present

## 2019-04-07 ENCOUNTER — Other Ambulatory Visit: Payer: Self-pay

## 2019-04-07 ENCOUNTER — Ambulatory Visit: Payer: Medicare Other | Attending: Internal Medicine | Admitting: Physical Therapy

## 2019-04-07 ENCOUNTER — Encounter: Payer: Self-pay | Admitting: Physical Therapy

## 2019-04-07 DIAGNOSIS — M6281 Muscle weakness (generalized): Secondary | ICD-10-CM | POA: Insufficient documentation

## 2019-04-07 DIAGNOSIS — Z9181 History of falling: Secondary | ICD-10-CM | POA: Diagnosis not present

## 2019-04-07 DIAGNOSIS — R262 Difficulty in walking, not elsewhere classified: Secondary | ICD-10-CM | POA: Diagnosis not present

## 2019-04-07 NOTE — Therapy (Signed)
[place] [facility] [address] [place], [place], [address] Phone: [phone]   Fax:  [phone]  Physical Therapy Treatment  Patient Details  Name: Bonnie Jackson MRN: [id number] Date of Birth: [birth year] Referring Provider (PT): Dr. Army Melia   Encounter Date: 04/07/2019  PT End of Session - 04/07/19 1256    Visit Number  8    Number of Visits  8    Date for PT Re-Evaluation  04/07/19    Authorization - Visit Number  8    Authorization - Number of Visits  10    PT Start Time  0813    PT Stop Time  0902    PT Time Calculation (min)  49 min    Equipment Utilized During Treatment  Gait belt    Activity Tolerance  Patient tolerated treatment well    Behavior During Therapy  [facility] for tasks assessed/performed       Past Medical History:  Diagnosis Date  . Brain aneurysm   . Cataract    right  . CVA (cerebral infarction)    no deficits  . Hypertension     Past Surgical History:  Procedure Laterality Date  . ABDOMINAL HERNIA REPAIR    . CATARACT EXTRACTION W/PHACO Right 06/24/2016   Procedure: CATARACT EXTRACTION PHACO AND INTRAOCULAR LENS PLACEMENT (IOC);  Surgeon: Ronnell Freshwater, MD;  Location: [place];  Service: Ophthalmology;  Laterality: Right;  RIGHT  . CHOLECYSTECTOMY    . IR GENERIC HISTORICAL  01/12/2016   IR ANGIO INTRA EXTRACRAN SEL INTERNAL CAROTID BILAT MOD SED 01/12/2016 Consuella Lose, MD [facility]INTER[facility]  . IR GENERIC HISTORICAL  01/12/2016   IR ANGIO VERTEBRAL SEL VERTEBRAL BILAT MOD SED 01/12/2016 Consuella Lose, MD [facility]  . IR GENERIC HISTORICAL  01/12/2016   IR 3D INDEPENDENT WKST 01/12/2016 Consuella Lose, MD [facility]INTER[facility]  . REPLACEMENT TOTAL KNEE Bilateral   . TOTAL ABDOMINAL HYSTERECTOMY      There were no vitals filed for this visit.  Subjective Assessment - 04/07/19 1254    Subjective  Pt reports continued difficulty sleeping and that she thinks it is affecting her balance. Pt  without reports of pain but reports fatigue today.  Pt. saw ENT with no answers or changes in tx.    Limitations  Walking;House hold activities;Standing    Patient Stated Goals  Improve walking/ balance    Currently in Pain?  No/denies        Therapeutic Exercise: Nustep L3, 10 min Seated marches with 5# ankle weights - 2x20.  Standing hip abduction with 5# ankle weights - 2x20 B LEs. Pt with increased fatigue. Minor cuing for technique. Standing hip extension with 5# ankle weights - 2x15 B LEs. Minor cuing for technique.  Neuro: SLB on foam - 6x30 sec B LEs with unilateral to intermittent UE support. Pt with increased pronation B LEs, but L>R.  Modified SLB on foam with toe-touch and intermittent UE support - 6x30 sec B LEs. Cuing provided for technique Star balance exercise with SPC B LEs- x multiple repetitions  Star balance, tapping laterally and onto cones, without SPC - 20x B LEs. Pt with greater difficulty with R LE as stance leg   PT Education - 04/07/19 1254    Education Details  Pt educated on performing standing toe tapping exercise with UE support at home to promote SLB.    Person(s) Educated  Patient    Methods  Explanation;Demonstration    Comprehension  Returned  demonstration;Verbalized understanding          PT Long Term Goals - 03/10/19 1241      PT LONG TERM GOAL #1   Title  Pt FOTO score will increase to 63 to indicate improved functional mobility.    Baseline  Pt FOTO score 54 03/10/2019    Time  4    Period  Weeks    Status  New    Target Date  04/07/19      PT LONG TERM GOAL #2   Title  Pt Berg Score will increase to at least 52/56 to demonstrate pt is at a low risk for falls    Baseline  Pt Berg score is 46/56 03/10/2019    Time  4    Period  Weeks    Status  New    Target Date  04/07/19      PT LONG TERM GOAL #3   Title  Pt will increase gait speed on 10MWT to 1.2 m/s to increase pt's ease with community activity and crossing the street safely.     Baseline  Pt 10MWT is 0.8 m/s 03/10/2019    Time  4    Period  Weeks    Status  New    Target Date  04/07/19      PT LONG TERM GOAL #4   Title  Pt will increase B hip flexion, B hip adduction and B dorsiflexion by at least  a MMT grade to improve gait mechanics.    Baseline  MMT: : LE grossly  5/5 except hip flexion B 4/5, dorsiflexion B 4+/5 and hip adduction B 4+/5 03/10/2019    Time  4    Period  Weeks    Status  New    Target Date  04/07/19            Plan - 04/07/19 1315    Clinical Impression Statement  Pt demonstrates progress with performing standing hip extension and abduction exercises with 5# ankle weights for greater repetitions, indicating increased B LE strength. Pt still with difficulty maintaining SLB and modified SLB with toe touch without at least intermittent UE support. Pt with greater difficulty today with R LE as stance leg during star balance lateral tapping on cones. Pt with difficulty due to requiring multiple attempts to increase L hip flexion in order to tap top of cones, requiring greater time in SLB on R LE, indicating decreased SLB and decreased strength of L hip flexors. Pt will continue to benefit from further skilled therapy to improve LE strength and static balance.    Personal Factors and Comorbidities  Age    Stability/Clinical Decision Making  Stable/Uncomplicated    Clinical Decision Making  Moderate    Rehab Potential  Good    PT Frequency  2x / week    PT Duration  4 weeks    PT Treatment/Interventions  ADLs/Self Care Home Management;Aquatic Therapy;Functional mobility training;Stair training;Gait training;Therapeutic activities;Therapeutic exercise;Balance training;Patient/family education;Neuromuscular re-education    PT Next Visit Plan  reassessment of goals    PT Home Exercise Plan  HEP: seated or standing marches with UE support, seate DF    Consulted and Agree with Plan of Care  Patient       Patient will benefit from skilled  therapeutic intervention in order to improve the following deficits and impairments:  Abnormal gait, Decreased endurance, Decreased activity tolerance, Decreased strength, Difficulty walking, Decreased balance, Decreased mobility, Decreased coordination, Postural dysfunction, Improper body mechanics, Decreased  range of motion  Visit Diagnosis: Muscle weakness (generalized)  Difficulty in walking, not elsewhere classified  Risk for falls     Problem List Patient Active Problem List   Diagnosis Date Noted  . Carotid artery stenosis 10/16/2018  . Current moderate episode of major depressive disorder without prior episode ([place]) 08/20/2018  . B12 nutritional deficiency 08/20/2018  . Neuropathy 06/06/2017  . Hearing loss of right ear 06/06/2017  . Gait instability 03/27/2017  . Muscle fasciculation 03/27/2017  . Gastroesophageal reflux disease 03/27/2017  . OAB (overactive bladder) 05/22/2016  . Cataract of right eye 01/19/2016  . Eversion of the eyelid 01/19/2016  . Neoplasm of uncertain behavior of skin 01/19/2016  . Thalamic infarction ([place]) 11/16/2015  . Aneurysm, cerebral, nonruptured 01/04/2015  . Essential (primary) hypertension 01/04/2015  . Mixed hyperlipidemia 01/04/2015  . Idiopathic insomnia 01/04/2015  . Special screening for malignant neoplasms, colon 01/04/2015   Pura Spice, PT, DPT # [address] SPT 04/07/2019, 2:37 PM  [place] [facility] [address] [place], [place], [address] Phone: [phone]   Fax:  [phone]  Name: Almitra Mere MRN: [id number] Date of Birth: April 15, 1941

## 2019-04-13 ENCOUNTER — Other Ambulatory Visit: Payer: Self-pay

## 2019-04-13 ENCOUNTER — Encounter: Payer: Self-pay | Admitting: Physical Therapy

## 2019-04-13 ENCOUNTER — Ambulatory Visit: Payer: Medicare Other | Admitting: Physical Therapy

## 2019-04-13 DIAGNOSIS — R262 Difficulty in walking, not elsewhere classified: Secondary | ICD-10-CM

## 2019-04-13 DIAGNOSIS — Z9181 History of falling: Secondary | ICD-10-CM | POA: Diagnosis not present

## 2019-04-13 DIAGNOSIS — M6281 Muscle weakness (generalized): Secondary | ICD-10-CM | POA: Diagnosis not present

## 2019-04-13 NOTE — Therapy (Signed)
[place] [facility] [address] [place], [place], [address] Phone: [phone]   Fax:  [phone]  Physical Therapy Treatment  Patient Details  Name: Bonnie Jackson MRN: [id number] Date of Birth: 1940-08-12 Referring Provider (PT): Dr. Army Melia   Encounter Date: 04/13/2019  PT End of Session - 04/13/19 0920    Visit Number  9    Number of Visits  12    Date for PT Re-Evaluation  05/11/19    Authorization - Visit Number  1    Authorization - Number of Visits  10    PT Start Time  0816    PT Stop Time  0901    PT Time Calculation (min)  45 min    Equipment Utilized During Treatment  Gait belt    Activity Tolerance  Patient tolerated treatment well    Behavior During Therapy  [facility] for tasks assessed/performed       Past Medical History:  Diagnosis Date  . Brain aneurysm   . Cataract    right  . CVA (cerebral infarction)    no deficits  . Hypertension     Past Surgical History:  Procedure Laterality Date  . ABDOMINAL HERNIA REPAIR    . CATARACT EXTRACTION W/PHACO Right 06/24/2016   Procedure: CATARACT EXTRACTION PHACO AND INTRAOCULAR LENS PLACEMENT (IOC);  Surgeon: Ronnell Freshwater, MD;  Location: [place];  Service: Ophthalmology;  Laterality: Right;  RIGHT  . CHOLECYSTECTOMY    . IR GENERIC HISTORICAL  01/12/2016   IR ANGIO INTRA EXTRACRAN SEL INTERNAL CAROTID BILAT MOD SED 01/12/2016 Consuella Lose, MD [facility]-INTER[facility]  . IR GENERIC HISTORICAL  01/12/2016   IR ANGIO VERTEBRAL SEL VERTEBRAL BILAT MOD SED 01/12/2016 Consuella Lose, MD [facility]  . IR GENERIC HISTORICAL  01/12/2016   IR 3D INDEPENDENT WKST 01/12/2016 Consuella Lose, MD [facility]  . REPLACEMENT TOTAL KNEE Bilateral   . TOTAL ABDOMINAL HYSTERECTOMY      There were no vitals filed for this visit.  Subjective Assessment - 04/13/19 0919    Subjective  Pt reports she feels tired and states she "doesn't want to get out of bed anymore." Pt reports  she doesn't think she'll stay active "anywhere else" but therapy. Pt without reports of pain today.    Limitations  Walking;House hold activities;Standing    Patient Stated Goals  Improve walking/ balance    Currently in Pain?  No/denies      FOTO - 41 (63 norm)  MMT:  L LE: knee extension 5/5, knee flexion 4+/5, hip flexion 4/5, hip abduction/adduction 5/5, DF 4/5; R LE: knee extension 5/5, knee flexion 4+/5, hip flexion 4/5, hip abduction/adduction 5/5, DF 4+/5  Therapeutic exercise:  Nustep, L4, 10 min Seated marches 5# ankle weights 2x20 10MWT 0.84 m/s without cane  Neuro: Merrilee Jansky- 50/56 SLB with 2-finger support - 1x60 sec B LEs   PT Education - 04/13/19 0919    Education Details  Pt educated on progress in therapy, meaning of outcome measures tested today, and POC    Person(s) Educated  Patient    Methods  Explanation    Comprehension  Verbalized understanding          PT Long Term Goals - 04/13/19 0934      PT LONG TERM GOAL #1   Title  Pt FOTO score will increase to 63 to indicate improved functional mobility.    Baseline  Pt FOTO score 54 03/10/2019; FOTO 63 04/13/2019  Time  4    Period  Weeks    Status  On-going    Target Date  05/11/19      PT LONG TERM GOAL #2   Title  Pt Berg Score will increase to at least 52/56 to demonstrate pt is at a low risk for falls    Baseline  Pt Berg score is 46/56 03/10/2019; Berg score 50/56 04/13/2019    Time  4    Period  Weeks    Status  Partially Met    Target Date  05/11/19      PT LONG TERM GOAL #3   Title  Pt will increase gait speed on 10MWT to 1.2 m/s to increase pt's ease with community activity and crossing the street safely.    Baseline  Pt 10MWT is 0.8 m/s 03/10/2019; 10MWT is 0.84 m/s without AD 04/13/2019    Time  4    Period  Weeks    Status  Partially Met      PT LONG TERM GOAL #4   Title  Pt will increase B hip flexion, B hip adduction and B dorsiflexion by at least  a MMT grade to improve gait mechanics.     Baseline  MMT: : LE grossly  5/5 except hip flexion B 4/5, dorsiflexion B 4+/5 and hip adduction B 4+/5 03/10/2019; L LE: knee extension 5/5, knee flexion 4+/5, hip flexion 4/5, hip abduction/adduction 5/5, DF 4/5; R LE: knee extension 5/5, knee flexion 4+/5, hip flexion 4/5, hip abduction/adduction 5/5, DF 4+/5 04/13/2019    Time  4    Period  Weeks    Status  Partially Met    Target Date  05/11/19            Plan - 04/13/19 0934    Clinical Impression Statement  Pt demonstrates progress today with improved Berg score (50/56) indicating improved balance, pt also with increased strength of B hip abductors as 5/5. Pt also demonstrates slight improvement with 10MWT without use of AD with 0.84 m/s gait speed. Although pt demonstrates progress pt with slight regression with FOTO score of 77 today (age norm 59). Pt also continues to demonstrate decreased strength of B hip flexors MMT of 4/5 B, B knee flexion 4+/5 and DF of 4+/5 on R and 4/5 on L. Pt will continue to benefit from further skilled therapy to improve functional mobility, balance, and B LE strength.    Personal Factors and Comorbidities  Age    Stability/Clinical Decision Making  Stable/Uncomplicated    Clinical Decision Making  Moderate    Rehab Potential  Good    PT Frequency  2x / week    PT Duration  4 weeks    PT Treatment/Interventions  ADLs/Self Care Home Management;Aquatic Therapy;Functional mobility training;Stair training;Gait training;Therapeutic activities;Therapeutic exercise;Balance training;Patient/family education;Neuromuscular re-education    PT Next Visit Plan  progress B LE strength exercises    PT Home Exercise Plan  HEP: seated or standing marches with UE support, seate DF    Consulted and Agree with Plan of Care  Patient       Patient will benefit from skilled therapeutic intervention in order to improve the following deficits and impairments:  Abnormal gait, Decreased endurance, Decreased activity tolerance,  Decreased strength, Difficulty walking, Decreased balance, Decreased mobility, Decreased coordination, Postural dysfunction, Improper body mechanics, Decreased range of motion  Visit Diagnosis: Muscle weakness (generalized)  Difficulty in walking, not elsewhere classified  Risk for falls     Problem  List Patient Active Problem List   Diagnosis Date Noted  . Carotid artery stenosis 10/16/2018  . Current moderate episode of major depressive disorder without prior episode ([place]) 08/20/2018  . B12 nutritional deficiency 08/20/2018  . Neuropathy 06/06/2017  . Hearing loss of right ear 06/06/2017  . Gait instability 03/27/2017  . Muscle fasciculation 03/27/2017  . Gastroesophageal reflux disease 03/27/2017  . OAB (overactive bladder) 05/22/2016  . Cataract of right eye 01/19/2016  . Eversion of the eyelid 01/19/2016  . Neoplasm of uncertain behavior of skin 01/19/2016  . Thalamic infarction ([place]) 11/16/2015  . Aneurysm, cerebral, nonruptured 01/04/2015  . Essential (primary) hypertension 01/04/2015  . Mixed hyperlipidemia 01/04/2015  . Idiopathic insomnia 01/04/2015  . Special screening for malignant neoplasms, colon 01/04/2015   Pura Spice, PT, DPT # [id number] Ricard Dillon, SPT 04/13/2019, 9:37 AM  [place] [facility] [address] [place], [place], [address] Phone: [phone]   Fax:  [phone]  Name: Bonnie Jackson MRN: [id number] Date of Birth: 12-28-1940

## 2019-04-13 NOTE — Addendum Note (Signed)
Addended by: Pura Spice on: 04/13/2019 01:32 PM   Modules accepted: Orders

## 2019-04-15 ENCOUNTER — Encounter: Payer: Medicare Other | Admitting: Physical Therapy

## 2019-04-19 ENCOUNTER — Encounter: Payer: Medicare Other | Admitting: Physical Therapy

## 2019-04-21 ENCOUNTER — Ambulatory Visit: Payer: Medicare Other | Admitting: Physical Therapy

## 2019-04-21 ENCOUNTER — Other Ambulatory Visit: Payer: Self-pay

## 2019-04-21 ENCOUNTER — Encounter: Payer: Self-pay | Admitting: Physical Therapy

## 2019-04-21 DIAGNOSIS — Z9181 History of falling: Secondary | ICD-10-CM | POA: Diagnosis not present

## 2019-04-21 DIAGNOSIS — M6281 Muscle weakness (generalized): Secondary | ICD-10-CM | POA: Diagnosis not present

## 2019-04-21 DIAGNOSIS — R262 Difficulty in walking, not elsewhere classified: Secondary | ICD-10-CM | POA: Diagnosis not present

## 2019-04-21 NOTE — Therapy (Signed)
[facility] [address] [place], [place], [address] Phone: [phone]   Fax:  [phone]  Physical Therapy Treatment  Patient Details  Name: Bonnie Jackson MRN: [id number] Date of Birth: 18-Oct-1940 Referring Provider (PT): Dr. Army Melia   Encounter Date: 04/21/2019  PT End of Session - 04/21/19 0925    Visit Number  10    Number of Visits  13    Date for PT Re-Evaluation  05/11/19    Authorization - Visit Number  2    Authorization - Number of Visits  10    PT Start Time  0817    PT Stop Time  0907    PT Time Calculation (min)  50 min    Equipment Utilized During Treatment  Gait belt    Activity Tolerance  Patient tolerated treatment well    Behavior During Therapy  [facility] for tasks assessed/performed       Past Medical History:  Diagnosis Date  . Brain aneurysm   . Cataract    right  . CVA (cerebral infarction)    no deficits  . Hypertension     Past Surgical History:  Procedure Laterality Date  . ABDOMINAL HERNIA REPAIR    . CATARACT EXTRACTION W/PHACO Right 06/24/2016   Procedure: CATARACT EXTRACTION PHACO AND INTRAOCULAR LENS PLACEMENT (IOC);  Surgeon: Ronnell Freshwater, MD;  Location: [place];  Service: Ophthalmology;  Laterality: Right;  RIGHT  . CHOLECYSTECTOMY    . IR GENERIC HISTORICAL  01/12/2016   IR ANGIO INTRA EXTRACRAN SEL INTERNAL CAROTID BILAT MOD SED 01/12/2016 Consuella Lose, MD [facility]  . IR GENERIC HISTORICAL  01/12/2016   IR ANGIO VERTEBRAL SEL VERTEBRAL BILAT MOD SED 01/12/2016 Consuella Lose, MD [facility]  . IR GENERIC HISTORICAL  01/12/2016   IR 3D INDEPENDENT WKST 01/12/2016 Consuella Lose, MD [facility]  . REPLACEMENT TOTAL KNEE Bilateral   . TOTAL ABDOMINAL HYSTERECTOMY      There were no vitals filed for this visit.  Subjective Assessment - 04/21/19 0923    Subjective  Pt repots she had fall at home when practicing SLB at her counter. Pt states she fell sideways  and was unable to grab onto the countertop in time. Pt reports no pain today but that her back hurt for a few days. Pt reports hitting part of her head on counter but reports she does not have a bruise and does not think she injured her head and reports she did not lose consciousness.    Limitations  Walking;House hold activities;Standing    Patient Stated Goals  Improve walking/ balance    Currently in Pain?  No/denies       Difficulty balancing on L LE  Therapeutic Exercise:  Nustep, L4, 10 min  Neuro:  Standing on foam, single leg stance with shoes - 10x30 seconds B with unilateral UE support Standing on foam, single leg stance no shoes - 10x30 seconds B with unilateral UE support Pt stair taps for single leg balance forward and contralaterally - x multiple reps B LEs Pt weight shifts in lunge, forward/backward B LEs - 20x each. Cuing for technique Pt weight shifts laterally - 20x each. Cuing for technique Agility ladder obstacle course with cone taps, with SPC, CGA - 8x      PT Education - 04/21/19 0924    Education Details  Pt educated on safety with exercises at home    Person(s) Educated  Patient    Methods  Explanation;Demonstration    Comprehension  Verbalized understanding;Returned demonstration          PT Long Term Goals - 04/13/19 0934      PT LONG TERM GOAL #1   Title  Pt FOTO score will increase to 63 to indicate improved functional mobility.    Baseline  Pt FOTO score 54 03/10/2019; FOTO 63 04/13/2019    Time  4    Period  Weeks    Status  Not Met    Target Date  05/11/19      PT LONG TERM GOAL #2   Title  Pt Berg Score will increase to at least 52/56 to demonstrate pt is at a low risk for falls    Baseline  Pt Berg score is 46/56 03/10/2019; Berg score 50/56 04/13/2019    Time  4    Period  Weeks    Status  Partially Met    Target Date  05/11/19      PT LONG TERM GOAL #3   Title  Pt will increase gait speed on 10MWT to 1.2 m/s to increase pt's ease  with community activity and crossing the street safely.    Baseline  Pt 10MWT is 0.8 m/s 03/10/2019; 10MWT is 0.84 m/s without AD 04/13/2019    Time  4    Period  Weeks    Status  Partially Met    Target Date  05/11/19      PT LONG TERM GOAL #4   Title  Pt will increase B hip flexion, B hip adduction and B dorsiflexion by at least  a MMT grade to improve gait mechanics.    Baseline  MMT: : LE grossly  5/5 except hip flexion B 4/5, dorsiflexion B 4+/5 and hip adduction B 4+/5 03/10/2019; L LE: knee extension 5/5, knee flexion 4+/5, hip flexion 4/5, hip abduction/adduction 5/5, DF 4/5; R LE: knee extension 5/5, knee flexion 4+/5, hip flexion 4/5, hip abduction/adduction 5/5, DF 4+/5 04/13/2019    Time  4    Period  Weeks    Status  Partially Met    Target Date  05/11/19          Plan - 04/21/19 0932    Clinical Impression Statement  Pt requires unilateral UE support for SLB on foam, and has greater difficulty with maintaining postural stability with L leg as stance leg compared to R. Pt with a few instances of lateral and anterior LOB during exercise, demonstrating poor ankle strategy B, and requiring CGA-min assist from SPT to regain balance. Pt cued extensively for weight shift technique for when pt starts listing during balance exercises. Pt also had greater difficulty with dynamic balance when L LE was stance leg during cone tap obstacle course, but showed improvement with repetition. Pt will continue to benefit from further skilled therapy to improve dynamic and static balance, and ankle strategies in order to decrease fall risk.    Personal Factors and Comorbidities  Age    Stability/Clinical Decision Making  Stable/Uncomplicated    Rehab Potential  Good    PT Frequency  1x / week    PT Duration  4 weeks    PT Treatment/Interventions  ADLs/Self Care Home Management;Aquatic Therapy;Functional mobility training;Stair training;Gait training;Therapeutic activities;Therapeutic exercise;Balance  training;Patient/family education;Neuromuscular re-education    PT Next Visit Plan  progress B LE strength exercises.  Pt. will continue with PT 1x/week to focus on higher level balance with progressive HEP; reactionary postural control exercises    PT  Home Exercise Plan  HEP: seated or standing marches with UE support, seate DF    Consulted and Agree with Plan of Care  Patient       Patient will benefit from skilled therapeutic intervention in order to improve the following deficits and impairments:  Abnormal gait, Decreased endurance, Decreased activity tolerance, Decreased strength, Difficulty walking, Decreased balance, Decreased mobility, Decreased coordination, Postural dysfunction, Improper body mechanics, Decreased range of motion  Visit Diagnosis: Muscle weakness (generalized)  Difficulty in walking, not elsewhere classified  Risk for falls     Problem List Patient Active Problem List   Diagnosis Date Noted  . Carotid artery stenosis 10/16/2018  . Current moderate episode of major depressive disorder without prior episode ([place]) 08/20/2018  . B12 nutritional deficiency 08/20/2018  . Neuropathy 06/06/2017  . Hearing loss of right ear 06/06/2017  . Gait instability 03/27/2017  . Muscle fasciculation 03/27/2017  . Gastroesophageal reflux disease 03/27/2017  . OAB (overactive bladder) 05/22/2016  . Cataract of right eye 01/19/2016  . Eversion of the eyelid 01/19/2016  . Neoplasm of uncertain behavior of skin 01/19/2016  . Thalamic infarction ([place]) 11/16/2015  . Aneurysm, cerebral, nonruptured 01/04/2015  . Essential (primary) hypertension 01/04/2015  . Mixed hyperlipidemia 01/04/2015  . Idiopathic insomnia 01/04/2015  . Special screening for malignant neoplasms, colon 01/04/2015   Pura Spice, PT, DPT # [id number] SPT 04/21/2019, 9:32 AM  [place] [facility] [address] [place], [place], [address] Phone:  [phone]   Fax:  [phone]  Name: Calyse Murcia MRN: [id number] Date of Birth: 1940-11-12

## 2019-04-26 ENCOUNTER — Encounter: Payer: Medicare Other | Admitting: Physical Therapy

## 2019-04-28 ENCOUNTER — Encounter: Payer: Self-pay | Admitting: Physical Therapy

## 2019-04-28 ENCOUNTER — Ambulatory Visit: Payer: Medicare Other | Admitting: Physical Therapy

## 2019-04-28 ENCOUNTER — Other Ambulatory Visit: Payer: Self-pay

## 2019-04-28 DIAGNOSIS — M6281 Muscle weakness (generalized): Secondary | ICD-10-CM

## 2019-04-28 DIAGNOSIS — R262 Difficulty in walking, not elsewhere classified: Secondary | ICD-10-CM

## 2019-04-28 DIAGNOSIS — Z9181 History of falling: Secondary | ICD-10-CM | POA: Diagnosis not present

## 2019-04-28 NOTE — Therapy (Signed)
[facility] [facility] [address] [place], [place], [address] Phone: [phone]   Fax:  [phone]  Physical Therapy Treatment  Patient Details  Name: Bonnie Jackson MRN: [id number] Date of Birth: 1941-01-29 Referring Provider (PT): Dr. Army Melia   Encounter Date: 04/28/2019  PT End of Session - 04/28/19 0917    Visit Number  11    Number of Visits  13    Date for PT Re-Evaluation  05/11/19    Authorization - Visit Number  3    Authorization - Number of Visits  10    PT Start Time  0819    PT Stop Time  0916    PT Time Calculation (min)  57 min    Equipment Utilized During Treatment  Gait belt    Activity Tolerance  Patient tolerated treatment well    Behavior During Therapy  [facility] for tasks assessed/performed       Past Medical History:  Diagnosis Date  . Brain aneurysm   . Cataract    right  . CVA (cerebral infarction)    no deficits  . Hypertension     Past Surgical History:  Procedure Laterality Date  . ABDOMINAL HERNIA REPAIR    . CATARACT EXTRACTION W/PHACO Right 06/24/2016   Procedure: CATARACT EXTRACTION PHACO AND INTRAOCULAR LENS PLACEMENT (IOC);  Surgeon: Ronnell Freshwater, MD;  Location: [facility];  Service: Ophthalmology;  Laterality: Right;  RIGHT  . CHOLECYSTECTOMY    . IR GENERIC HISTORICAL  01/12/2016   IR ANGIO INTRA EXTRACRAN SEL INTERNAL CAROTID BILAT MOD SED 01/12/2016 Consuella Lose, MD [facility]  . IR GENERIC HISTORICAL  01/12/2016   IR ANGIO VERTEBRAL SEL VERTEBRAL BILAT MOD SED 01/12/2016 Consuella Lose, MD [facility]  . IR GENERIC HISTORICAL  01/12/2016   IR 3D INDEPENDENT WKST 01/12/2016 Consuella Lose, MD [facility]  . REPLACEMENT TOTAL KNEE Bilateral   . TOTAL ABDOMINAL HYSTERECTOMY      There were no vitals filed for this visit.  Subjective Assessment - 04/28/19 0916    Subjective  Pt reports she has been very active lately and has walked a lot.  Pt reports she had nice  weekend because her grandchildren came to visist. Pt reports she is going for a walk with a friend after therapy today.    Limitations  Walking;House hold activities;Standing    Patient Stated Goals  Improve walking/ balance    Currently in Pain?  No/denies       Neuro: Standing reaching balance fishing on foam surface, and semi-tandem- x2 each; pt able to use posterior stepping strategy for LOB posteriorly and laterally Stair/ cone tapping  Obstacle course with turns and unstable surface and between cones(7/10 RPS; 9/10 with L turn) - x several minutes Walk around gym with cognitive reading task - x6 (5-6/10)  Therapeutic Exercise: Nustep L3, x42mn    PT Long Term Goals - 04/13/19 0934      PT LONG TERM GOAL #1   Title  Pt FOTO score will increase to 63 to indicate improved functional mobility.    Baseline  Pt FOTO score 54 03/10/2019; FOTO 63 04/13/2019    Time  4    Period  Weeks    Status  Not Met    Target Date  05/11/19      PT LONG TERM GOAL #2   Title  Pt Berg Score will increase to at least 52/56 to demonstrate pt is at a low risk for  falls    Baseline  Pt Berg score is 46/56 03/10/2019; Berg score 50/56 04/13/2019    Time  4    Period  Weeks    Status  Partially Met    Target Date  05/11/19      PT LONG TERM GOAL #3   Title  Pt will increase gait speed on 10MWT to 1.2 m/s to increase pt's ease with community activity and crossing the street safely.    Baseline  Pt 10MWT is 0.8 m/s 03/10/2019; 10MWT is 0.84 m/s without AD 04/13/2019    Time  4    Period  Weeks    Status  Partially Met    Target Date  05/11/19      PT LONG TERM GOAL #4   Title  Pt will increase B hip flexion, B hip adduction and B dorsiflexion by at least  a MMT grade to improve gait mechanics.    Baseline  MMT: : LE grossly  5/5 except hip flexion B 4/5, dorsiflexion B 4+/5 and hip adduction B 4+/5 03/10/2019; L LE: knee extension 5/5, knee flexion 4+/5, hip flexion 4/5, hip abduction/adduction 5/5, DF 4/5;  R LE: knee extension 5/5, knee flexion 4+/5, hip flexion 4/5, hip abduction/adduction 5/5, DF 4+/5 04/13/2019    Time  4    Period  Weeks    Status  Partially Met    Target Date  05/11/19            Plan - 04/28/19 0932    Clinical Impression Statement  Pt shows progress ambulating and performing majority of exercises without SPC and with improved dynamic and static stability. Pt able to progress to standing on foam and standing on foam semi-tandem with dual task (fishing exercise) requiring only intermittent UE support. Pt with 4 instances of posterior loss of balance, where pt was able to regain balance either by UE assist or with posterior stepping strategy. Pt had greatest difficult with ambulating with sudden turn/pivots on L LEand lost stability laterally requiring multiple steps and UE assist to regain balance. Pt will benefit from further skilled therapy to improve static and dynamic balance to decrease fall risk.    Personal Factors and Comorbidities  Age    Stability/Clinical Decision Making  Stable/Uncomplicated    Clinical Decision Making  Moderate    Rehab Potential  Good    PT Frequency  1x / week    PT Duration  4 weeks    PT Treatment/Interventions  ADLs/Self Care Home Management;Aquatic Therapy;Functional mobility training;Stair training;Gait training;Therapeutic activities;Therapeutic exercise;Balance training;Patient/family education;Neuromuscular re-education    PT Next Visit Plan  Issue new HEP, progress dynamic balance    PT Home Exercise Plan  HEP: seated or standing marches with UE support, seate DF    Consulted and Agree with Plan of Care  Patient       Patient will benefit from skilled therapeutic intervention in order to improve the following deficits and impairments:  Abnormal gait, Decreased endurance, Decreased activity tolerance, Decreased strength, Difficulty walking, Decreased balance, Decreased mobility, Decreased coordination, Postural dysfunction,  Improper body mechanics, Decreased range of motion  Visit Diagnosis: Muscle weakness (generalized)  Difficulty in walking, not elsewhere classified  Risk for falls     Problem List Patient Active Problem List   Diagnosis Date Noted  . Carotid artery stenosis 10/16/2018  . Current moderate episode of major depressive disorder without prior episode ([place]) 08/20/2018  . B12 nutritional deficiency 08/20/2018  . Neuropathy 06/06/2017  .  Hearing loss of right ear 06/06/2017  . Gait instability 03/27/2017  . Muscle fasciculation 03/27/2017  . Gastroesophageal reflux disease 03/27/2017  . OAB (overactive bladder) 05/22/2016  . Cataract of right eye 01/19/2016  . Eversion of the eyelid 01/19/2016  . Neoplasm of uncertain behavior of skin 01/19/2016  . Thalamic infarction ([place]) 11/16/2015  . Aneurysm, cerebral, nonruptured 01/04/2015  . Essential (primary) hypertension 01/04/2015  . Mixed hyperlipidemia 01/04/2015  . Idiopathic insomnia 01/04/2015  . Special screening for malignant neoplasms, colon 01/04/2015   Pura Spice, PT, DPT # [id number] SPT 04/28/2019, 9:34 AM  [place] [facility] [address] [place], [place], [address] Phone: [phone]   Fax:  [phone]  Name: Bonnie Jackson MRN: [id number] Date of Birth: 02-Mar-1941

## 2019-05-03 ENCOUNTER — Encounter: Payer: Medicare Other | Admitting: Physical Therapy

## 2019-05-05 ENCOUNTER — Encounter: Payer: Medicare Other | Admitting: Physical Therapy

## 2019-05-11 DIAGNOSIS — Z23 Encounter for immunization: Secondary | ICD-10-CM | POA: Diagnosis not present

## 2019-05-13 ENCOUNTER — Ambulatory Visit: Payer: Medicare Other | Admitting: Physical Therapy

## 2019-06-09 ENCOUNTER — Other Ambulatory Visit: Payer: Self-pay | Admitting: Internal Medicine

## 2019-06-09 ENCOUNTER — Telehealth: Payer: Self-pay

## 2019-06-09 DIAGNOSIS — F5101 Primary insomnia: Secondary | ICD-10-CM

## 2019-06-09 MED ORDER — TRAZODONE HCL 50 MG PO TABS: 50.0000 mg | ORAL_TABLET | Freq: Every day | ORAL | 0 refills | Status: DC

## 2019-06-09 NOTE — Telephone Encounter (Signed)
Patient called requesting RF on Trazodone. Says she only has used as needed and just now running out.   Needs RF sent to [facility] in [place].  Benedict Needy, CMA

## 2019-06-09 NOTE — Telephone Encounter (Signed)
Rx sent 

## 2019-06-10 NOTE — Telephone Encounter (Signed)
Patient informed and will pick up today

## 2019-06-11 ENCOUNTER — Telehealth: Payer: Self-pay | Admitting: Internal Medicine

## 2019-06-11 NOTE — Telephone Encounter (Signed)
Declined awv with Nurse Health Advisor

## 2019-07-20 ENCOUNTER — Ambulatory Visit: Payer: Medicare Other | Admitting: Internal Medicine

## 2019-07-22 ENCOUNTER — Ambulatory Visit (INDEPENDENT_AMBULATORY_CARE_PROVIDER_SITE_OTHER): Payer: Medicare Other | Admitting: Vascular Surgery

## 2019-07-22 ENCOUNTER — Encounter (INDEPENDENT_AMBULATORY_CARE_PROVIDER_SITE_OTHER): Payer: Medicare Other

## 2019-09-01 ENCOUNTER — Other Ambulatory Visit: Payer: Self-pay | Admitting: Internal Medicine

## 2019-09-01 DIAGNOSIS — F5101 Primary insomnia: Secondary | ICD-10-CM

## 2019-09-10 IMAGING — US BILATERAL CAROTID DUPLEX ULTRASOUND
1 series · 13 of 24 positions shown · non-contrast
Comparison: 11/16/2015

CLINICAL DATA: Carotid atherosclerosis

EXAM:
BILATERAL CAROTID DUPLEX ULTRASOUND
TECHNIQUE: Gray scale imaging, color Doppler and duplex ultrasound were
performed of bilateral carotid and vertebral arteries in the neck.

[Series 1: bilateral carotid duplex ultrasound · 0.06mm/px · 13 of 66 slices shown]
[im 1/66]
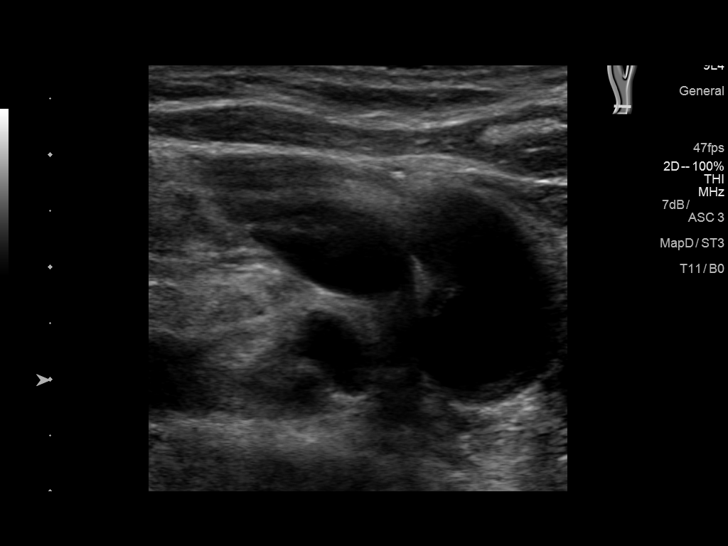
[im 6/66]
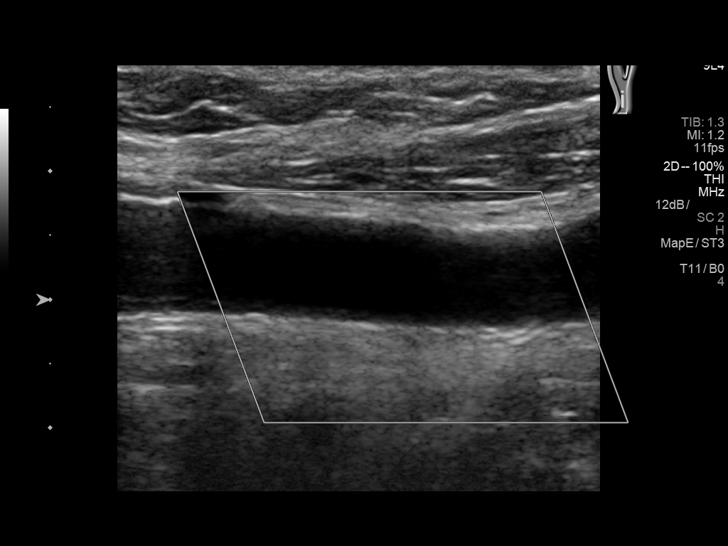
[im 12/66]
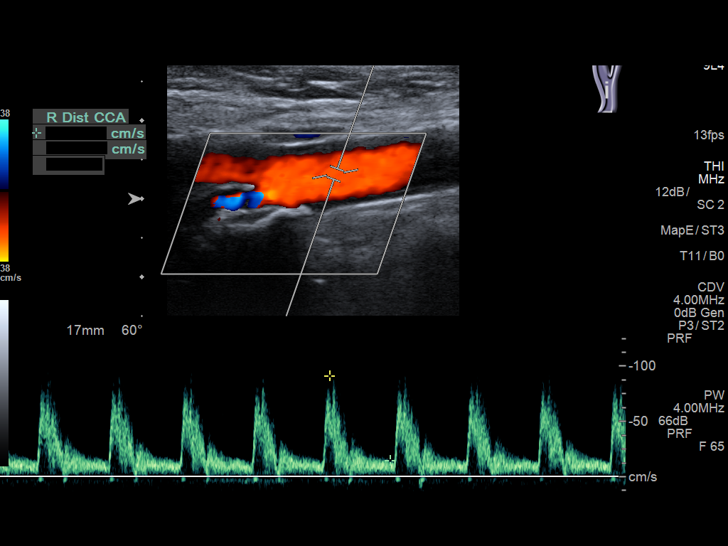
[im 17/66]
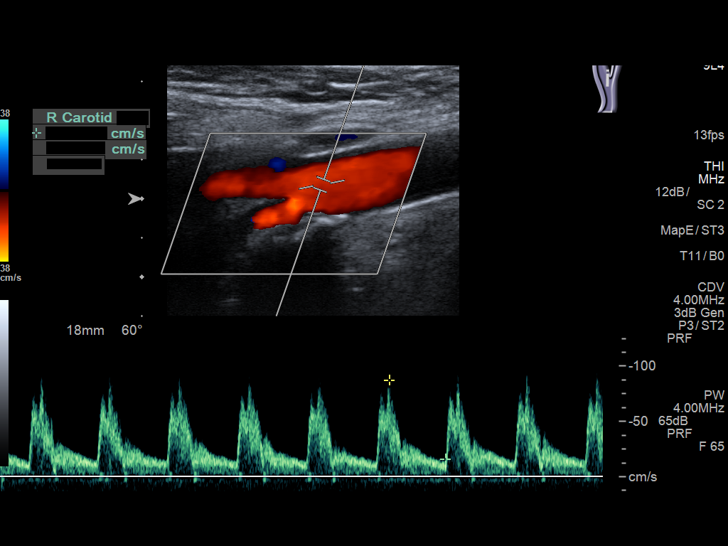
[im 23/66]
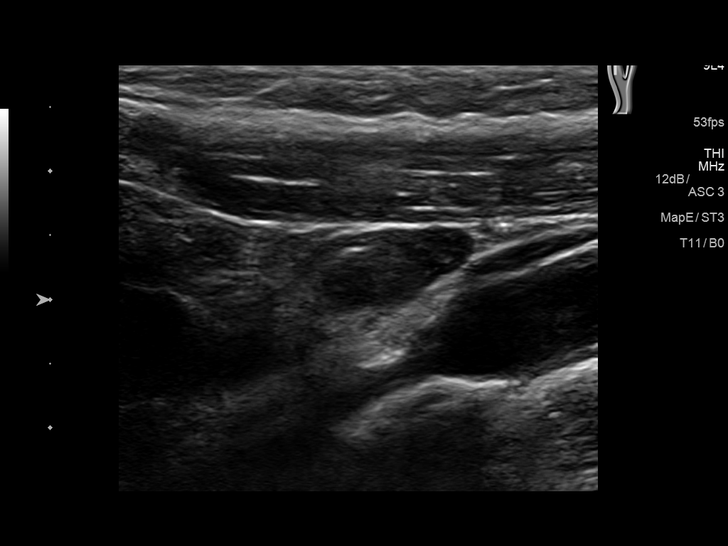
[im 29/66]
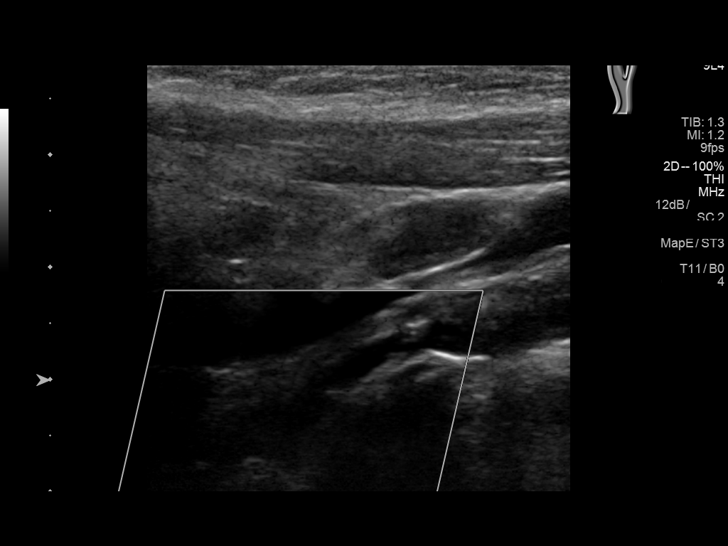
[im 34/66]
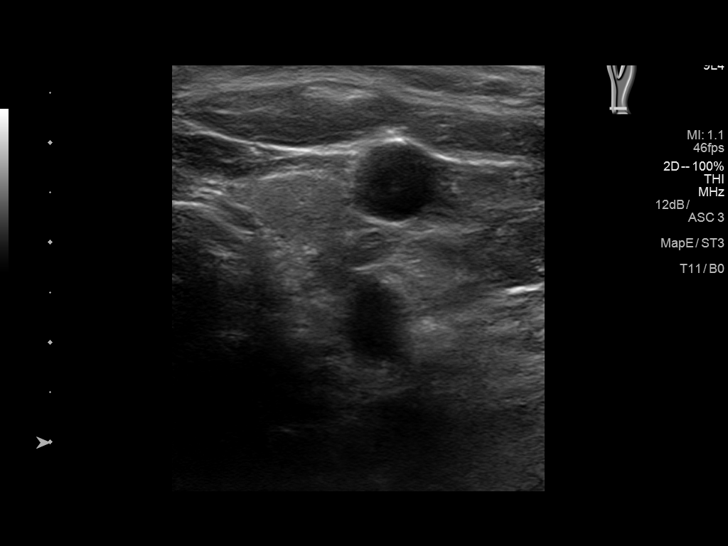
[im 37/66]
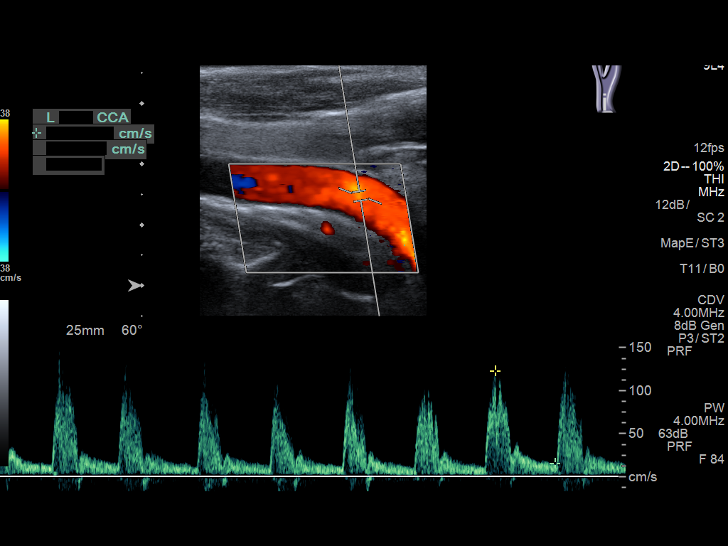
[im 43/66]
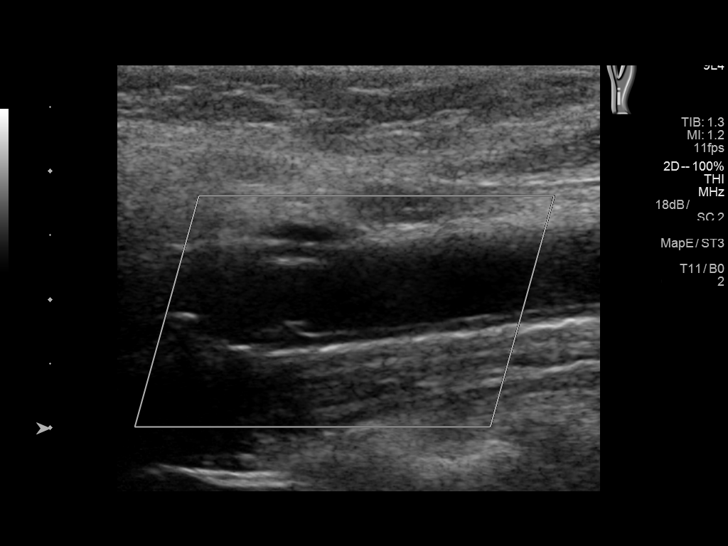
[im 49/66]
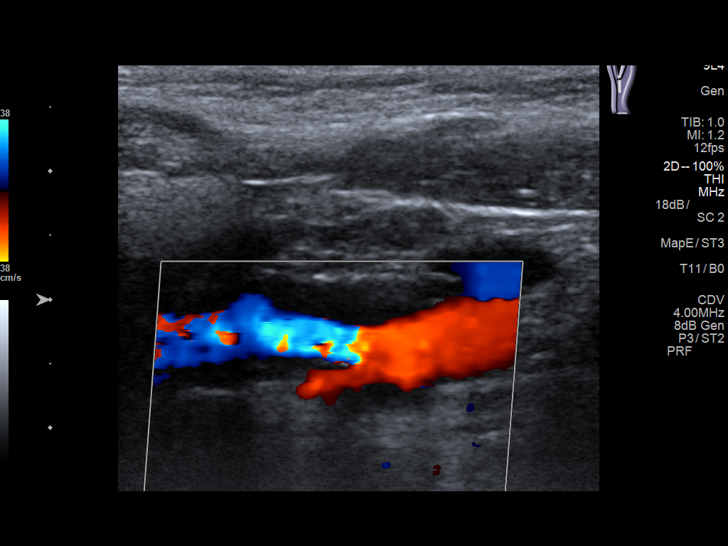
[im 54/66]
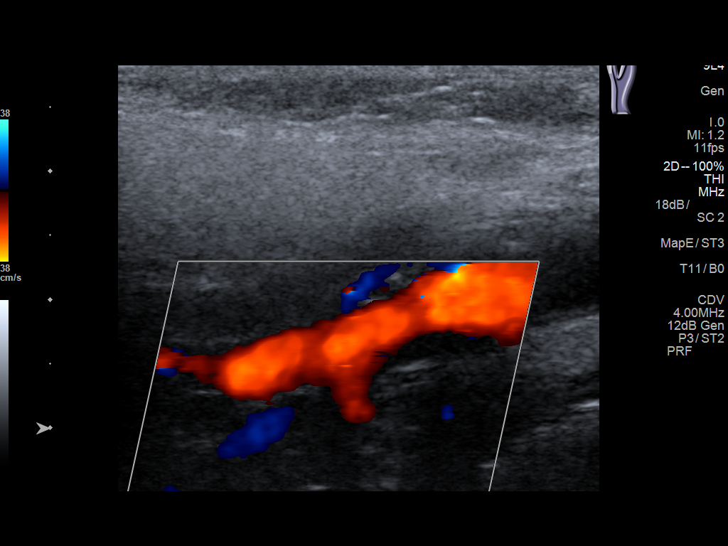
[im 60/66]
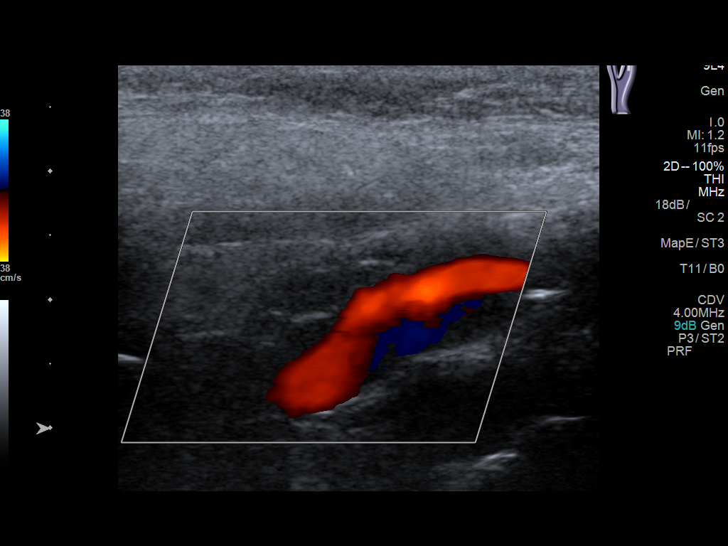
[im 66/66]
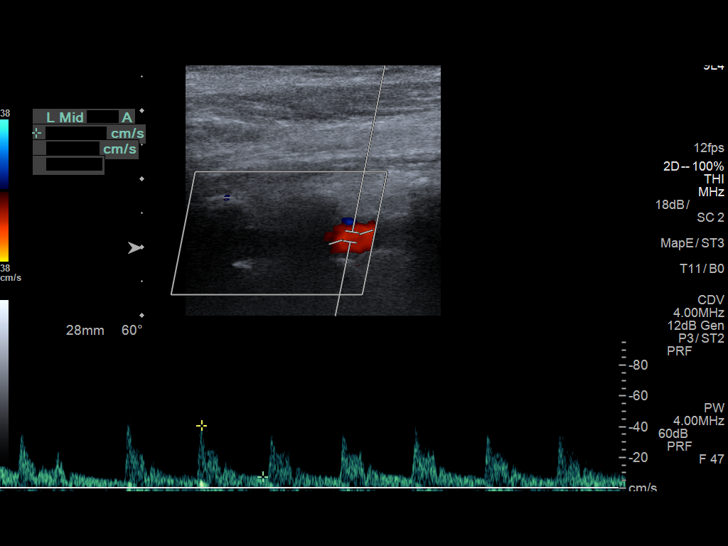

[13 of 24 positions shown; findings below may reference images not displayed]

FINDINGS: Criteria: Quantification of carotid stenosis is based on velocity
parameters that correlate the residual internal carotid diameter
with NASCET-based stenosis levels, using the diameter of the distal
internal carotid lumen as the denominator for stenosis measurement.

The following velocity measurements were obtained:

RIGHT

ICA: 199/58 cm/sec

CCA: 114/15 cm/sec

SYSTOLIC ICA/CCA RATIO:

ECA: 112 cm/sec

LEFT

ICA: 190/40 cm/sec

CCA: 127/12 cm/sec

SYSTOLIC ICA/CCA RATIO:

ECA: 35 cm/sec

RIGHT CAROTID ARTERY: Moderate heterogeneous partially calcified
proximal right ICA atherosclerosis. This narrows the ICA lumen by
grayscale imaging. In this region there is mild velocity elevation
measuring 199/58 centimeters/second but no significant turbulent
flow. Degree of stenosis estimated at 50-69% by ultrasound criteria.

RIGHT VERTEBRAL ARTERY:  Antegrade

LEFT CAROTID ARTERY: Similar scattered minor echogenic plaque
formation. No hemodynamically significant left ICA stenosis,
velocity elevation, or turbulent flow.

LEFT VERTEBRAL ARTERY:  Antegrade
IMPRESSION: Right ICA stenosis estimated at 50-69% by ultrasound criteria

Left ICA narrowing less than 50%

Patent antegrade vertebral flow bilaterally

## 2019-10-05 DIAGNOSIS — H2512 Age-related nuclear cataract, left eye: Secondary | ICD-10-CM | POA: Diagnosis not present

## 2019-10-15 DIAGNOSIS — H2512 Age-related nuclear cataract, left eye: Secondary | ICD-10-CM | POA: Diagnosis not present

## 2019-10-15 DIAGNOSIS — I10 Essential (primary) hypertension: Secondary | ICD-10-CM | POA: Diagnosis not present

## 2019-10-19 ENCOUNTER — Encounter: Payer: Self-pay | Admitting: Ophthalmology

## 2019-10-19 ENCOUNTER — Other Ambulatory Visit: Payer: Self-pay

## 2019-10-21 ENCOUNTER — Other Ambulatory Visit: Payer: Self-pay

## 2019-10-21 ENCOUNTER — Other Ambulatory Visit
Admission: RE | Admit: 2019-10-21 | Discharge: 2019-10-21 | Disposition: A | Discharge status: Home / Self Care | Payer: Medicare Other | Source: Ambulatory Visit | Attending: Ophthalmology | Admitting: Ophthalmology

## 2019-10-21 DIAGNOSIS — Z20822 Contact with and (suspected) exposure to covid-19: Secondary | ICD-10-CM | POA: Diagnosis not present

## 2019-10-21 DIAGNOSIS — Z01812 Encounter for preprocedural laboratory examination: Secondary | ICD-10-CM | POA: Insufficient documentation

## 2019-10-21 LAB — SARS CORONAVIRUS 2 (TAT 6-24 HRS): SARS Coronavirus 2: NEGATIVE

## 2019-10-21 NOTE — Discharge Instructions (Signed)

## 2019-10-25 ENCOUNTER — Ambulatory Visit: Payer: Medicare Other | Admitting: Anesthesiology

## 2019-10-25 ENCOUNTER — Other Ambulatory Visit: Payer: Self-pay

## 2019-10-25 ENCOUNTER — Ambulatory Visit
Admission: RE | Admit: 2019-10-25 | Discharge: 2019-10-25 | Disposition: A | Discharge status: Home / Self Care | Payer: Medicare Other | Attending: Ophthalmology | Admitting: Ophthalmology

## 2019-10-25 ENCOUNTER — Encounter
Admission: RE | Disposition: A | Discharge status: Home / Self Care | Payer: Self-pay | Source: Home / Self Care | Attending: Ophthalmology

## 2019-10-25 ENCOUNTER — Encounter: Payer: Self-pay | Admitting: Ophthalmology

## 2019-10-25 DIAGNOSIS — Z8679 Personal history of other diseases of the circulatory system: Secondary | ICD-10-CM | POA: Diagnosis not present

## 2019-10-25 DIAGNOSIS — Z8673 Personal history of transient ischemic attack (TIA), and cerebral infarction without residual deficits: Secondary | ICD-10-CM | POA: Insufficient documentation

## 2019-10-25 DIAGNOSIS — Z90710 Acquired absence of both cervix and uterus: Secondary | ICD-10-CM | POA: Diagnosis not present

## 2019-10-25 DIAGNOSIS — H25812 Combined forms of age-related cataract, left eye: Secondary | ICD-10-CM | POA: Diagnosis not present

## 2019-10-25 DIAGNOSIS — I10 Essential (primary) hypertension: Secondary | ICD-10-CM | POA: Insufficient documentation

## 2019-10-25 DIAGNOSIS — Z96653 Presence of artificial knee joint, bilateral: Secondary | ICD-10-CM | POA: Insufficient documentation

## 2019-10-25 DIAGNOSIS — R413 Other amnesia: Secondary | ICD-10-CM | POA: Diagnosis not present

## 2019-10-25 DIAGNOSIS — Z79899 Other long term (current) drug therapy: Secondary | ICD-10-CM | POA: Insufficient documentation

## 2019-10-25 DIAGNOSIS — H2512 Age-related nuclear cataract, left eye: Secondary | ICD-10-CM | POA: Insufficient documentation

## 2019-10-25 HISTORY — PX: CATARACT EXTRACTION W/PHACO: SHX586

## 2019-10-25 SURGERY — PHACOEMULSIFICATION, CATARACT, WITH IOL INSERTION
Anesthesia: Monitor Anesthesia Care | Site: Eye | Laterality: Left

## 2019-10-25 MED ORDER — MOXIFLOXACIN HCL 0.5 % OP SOLN
OPHTHALMIC | Status: DC | PRN
  Administered 2019-10-25: 0.2 mL via OPHTHALMIC

## 2019-10-25 MED ORDER — SODIUM HYALURONATE 23 MG/ML IO SOLN
INTRAOCULAR | Status: DC | PRN
  Administered 2019-10-25: 0.6 mL via INTRAOCULAR

## 2019-10-25 MED ORDER — MIDAZOLAM HCL 2 MG/2ML IJ SOLN
INTRAMUSCULAR | Status: DC | PRN
  Administered 2019-10-25 (×2): 1 mg via INTRAVENOUS

## 2019-10-25 MED ORDER — FENTANYL CITRATE (PF) 100 MCG/2ML IJ SOLN
INTRAMUSCULAR | Status: DC | PRN
  Administered 2019-10-25 (×2): 50 ug via INTRAVENOUS

## 2019-10-25 MED ORDER — ARMC OPHTHALMIC DILATING DROPS
1.0000 "application " | OPHTHALMIC | Status: DC | PRN
  Administered 2019-10-25 (×3): 1 via OPHTHALMIC

## 2019-10-25 MED ORDER — EPINEPHRINE PF 1 MG/ML IJ SOLN
INTRAOCULAR | Status: DC | PRN
  Administered 2019-10-25: 99 mL via OPHTHALMIC

## 2019-10-25 MED ORDER — SODIUM HYALURONATE 10 MG/ML IO SOLN
INTRAOCULAR | Status: DC | PRN
  Administered 2019-10-25: 0.55 mL via INTRAOCULAR

## 2019-10-25 MED ORDER — ACETAMINOPHEN 325 MG PO TABS: 325.0000 mg | ORAL_TABLET | ORAL | Status: DC | PRN

## 2019-10-25 MED ORDER — ACETAMINOPHEN 160 MG/5ML PO SOLN: 325.0000 mg | ORAL | Status: DC | PRN

## 2019-10-25 MED ORDER — TETRACAINE HCL 0.5 % OP SOLN
1.0000 [drp] | OPHTHALMIC | Status: DC | PRN
  Administered 2019-10-25 (×3): 1 [drp] via OPHTHALMIC

## 2019-10-25 MED ORDER — LIDOCAINE HCL (PF) 2 % IJ SOLN
INTRAOCULAR | Status: DC | PRN
  Administered 2019-10-25: 1 mL via INTRAOCULAR

## 2019-10-25 SURGICAL SUPPLY — 19 items
CANNULA ANT/CHMB 27G (MISCELLANEOUS) ×2 IMPLANT
CANNULA ANT/CHMB 27GA (MISCELLANEOUS) ×4 IMPLANT
DISSECTOR HYDRO NUCLEUS 50X22 (MISCELLANEOUS) ×2 IMPLANT
GLOVE SURG LX 7.5 STRW (GLOVE) ×1
GLOVE SURG LX STRL 7.5 STRW (GLOVE) ×1 IMPLANT
GLOVE SURG SYN 8.5  E (GLOVE) ×1
GLOVE SURG SYN 8.5 E (GLOVE) ×1 IMPLANT
GLOVE SURG SYN 8.5 PF PI (GLOVE) ×1 IMPLANT
GOWN STRL REUS W/ TWL LRG LVL3 (GOWN DISPOSABLE) ×2 IMPLANT
GOWN STRL REUS W/TWL LRG LVL3 (GOWN DISPOSABLE) ×2
LENS IOL ACRYSOF IQ 21.0 (Intraocular Lens) ×1 IMPLANT
MARKER SKIN DUAL TIP RULER LAB (MISCELLANEOUS) ×2 IMPLANT
PACK DR. KING ARMS (PACKS) ×2 IMPLANT
PACK EYE AFTER SURG (MISCELLANEOUS) ×2 IMPLANT
PACK OPTHALMIC (MISCELLANEOUS) ×2 IMPLANT
SYR 3ML LL SCALE MARK (SYRINGE) ×2 IMPLANT
SYR TB 1ML LUER SLIP (SYRINGE) ×2 IMPLANT
WATER STERILE IRR 250ML POUR (IV SOLUTION) ×2 IMPLANT
WIPE NON LINTING 3.25X3.25 (MISCELLANEOUS) ×2 IMPLANT

## 2019-10-25 NOTE — H&P (Signed)

## 2019-10-25 NOTE — Anesthesia Postprocedure Evaluation (Signed)
Anesthesia Post Note  Patient: Bonnie Jackson  Procedure(s) Performed: CATARACT EXTRACTION PHACO AND INTRAOCULAR LENS PLACEMENT (IOC) LEFT 4.00  00:41.9  (Left Eye)     Patient location during evaluation: PACU Anesthesia Type: MAC Level of consciousness: awake and alert Pain management: pain level controlled Vital Signs Assessment: post-procedure vital signs reviewed and stable Respiratory status: spontaneous breathing Cardiovascular status: stable Anesthetic complications: no    Seleena Reimers, III,  Earlie Schank D

## 2019-10-25 NOTE — Anesthesia Procedure Notes (Signed)
Procedure Name: MAC Date/Time: 10/25/2019 10:03 AM Performed by: Georga Bora, CRNA Pre-anesthesia Checklist: Patient identified, Emergency Drugs available, Suction available, Patient being monitored and Timeout performed Oxygen Delivery Method: Nasal cannula

## 2019-10-25 NOTE — Op Note (Signed)
OPERATIVE NOTE  Bonnie Jackson BP:[id number] 10/25/2019   PREOPERATIVE DIAGNOSIS:  Nuclear sclerotic cataract left eye.  H25.12   POSTOPERATIVE DIAGNOSIS:    Nuclear sclerotic cataract left eye.     PROCEDURE:  Phacoemusification with posterior chamber intraocular lens placement of the left eye   LENS:   Implant Name Type Inv. Item Serial No. Manufacturer Lot No. LRB No. Used Action  LENS IOL ACRYSOF IQ 21.0 - [id number] Intraocular Lens LENS IOL ACRYSOF IQ 21.0 [id number] [employer]  Left 1 Implanted      Procedure(s): CATARACT EXTRACTION PHACO AND INTRAOCULAR LENS PLACEMENT (IOC) LEFT 4.00  00:41.9  (Left)  [id number] +21.0   ULTRASOUND TIME: 0 minutes 41 seconds.  CDE 4.00   SURGEON:  Benay Pillow, MD, MPH   ANESTHESIA:  Topical with tetracaine drops augmented with 1% preservative-free intracameral lidocaine.  ESTIMATED BLOOD LOSS: <1 mL   COMPLICATIONS:  None.   DESCRIPTION OF PROCEDURE:  The patient was identified in the holding room and transported to the operating room and placed in the supine position under the operating microscope.  The left eye was identified as the operative eye and it was prepped and draped in the usual sterile ophthalmic fashion.   A 1.0 millimeter clear-corneal paracentesis was made at the 5:00 position. 0.5 ml of preservative-free 1% lidocaine with epinephrine was injected into the anterior chamber.  The anterior chamber was filled with Healon 5 viscoelastic.  A 2.4 millimeter keratome was used to make a near-clear corneal incision at the 2:00 position.  A curvilinear capsulorrhexis was made with a cystotome and capsulorrhexis forceps.  Balanced salt solution was used to hydrodissect and hydrodelineate the nucleus.   Phacoemulsification was then used in stop and chop fashion to remove the lens nucleus and epinucleus.  The remaining cortex was then removed using the irrigation and aspiration handpiece. Healon was then placed into the capsular bag to distend it  for lens placement.  A lens was then injected into the capsular bag.  The remaining viscoelastic was aspirated.   Wounds were hydrated with balanced salt solution.  The anterior chamber was inflated to a physiologic pressure with balanced salt solution.  Intracameral vigamox 0.1 mL undiltued was injected into the eye and a drop placed onto the ocular surface.  No wound leaks were noted.  The patient was taken to the recovery room in stable condition without complications of anesthesia or surgery  Benay Pillow 10/25/2019, 10:25 AM

## 2019-10-25 NOTE — Anesthesia Preprocedure Evaluation (Addendum)
Anesthesia Evaluation  Patient identified by MRN, date of birth, ID band Patient awake    Reviewed: Allergy & Precautions, H&P , NPO status , Patient's Chart, lab work & pertinent test results  History of Anesthesia Complications Negative for: history of anesthetic complications  Airway Mallampati: II  TM Distance: >3 FB Neck ROM: full    Dental no notable dental hx.    Pulmonary neg pulmonary ROS,    Pulmonary exam normal breath sounds clear to auscultation       Cardiovascular hypertension, On Medications Normal cardiovascular exam Rhythm:regular Rate:Normal     Neuro/Psych negative neurological ROS     GI/Hepatic Neg liver ROS, GERD  Medicated,  Endo/Other  negative endocrine ROS  Renal/GU negative Renal ROS  negative genitourinary   Musculoskeletal   Abdominal   Peds  Hematology negative hematology ROS (+)   Anesthesia Other Findings   Reproductive/Obstetrics                            Anesthesia Physical Anesthesia Plan  ASA: II  Anesthesia Plan: MAC   Post-op Pain Management:    Induction:   PONV Risk Score and Plan:   Airway Management Planned:   Additional Equipment:   Intra-op Plan:   Post-operative Plan:   Informed Consent: I have reviewed the patients History and Physical, chart, labs and discussed the procedure including the risks, benefits and alternatives for the proposed anesthesia with the patient or authorized representative who has indicated his/her understanding and acceptance.       Plan Discussed with:   Anesthesia Plan Comments:        Anesthesia Quick Evaluation

## 2019-10-25 NOTE — Transfer of Care (Signed)
Immediate Anesthesia Transfer of Care Note  Patient: Bonnie Jackson  Procedure(s) Performed: CATARACT EXTRACTION PHACO AND INTRAOCULAR LENS PLACEMENT (IOC) LEFT 4.00  00:41.9  (Left Eye)  Patient Location: PACU  Anesthesia Type: MAC  Level of Consciousness: awake, alert  and patient cooperative  Airway and Oxygen Therapy: Patient Spontanous Breathing and Patient connected to supplemental oxygen  Post-op Assessment: Post-op Vital signs reviewed, Patient's Cardiovascular Status Stable, Respiratory Function Stable, Patent Airway and No signs of Nausea or vomiting  Post-op Vital Signs: Reviewed and stable  Complications: No apparent anesthesia complications

## 2019-10-26 ENCOUNTER — Encounter: Payer: Self-pay | Admitting: *Deleted

## 2019-11-17 ENCOUNTER — Ambulatory Visit: Payer: Medicare Other | Admitting: Internal Medicine

## 2019-11-25 ENCOUNTER — Other Ambulatory Visit: Payer: Self-pay

## 2019-11-25 ENCOUNTER — Encounter: Payer: Self-pay | Admitting: Internal Medicine

## 2019-11-25 ENCOUNTER — Ambulatory Visit (INDEPENDENT_AMBULATORY_CARE_PROVIDER_SITE_OTHER): Payer: Medicare Other | Admitting: Internal Medicine

## 2019-11-25 VITALS — BP 134/62 | HR 70 | Temp 96.8°F | Ht 64.0 in | Wt 188.0 lb

## 2019-11-25 DIAGNOSIS — R600 Localized edema: Secondary | ICD-10-CM

## 2019-11-25 DIAGNOSIS — I872 Venous insufficiency (chronic) (peripheral): Secondary | ICD-10-CM | POA: Diagnosis not present

## 2019-11-25 DIAGNOSIS — I10 Essential (primary) hypertension: Secondary | ICD-10-CM

## 2019-11-25 DIAGNOSIS — R7989 Other specified abnormal findings of blood chemistry: Secondary | ICD-10-CM | POA: Diagnosis not present

## 2019-11-25 MED ORDER — HYDROCHLOROTHIAZIDE 25 MG PO TABS: 12.5000 mg | ORAL_TABLET | Freq: Every day | ORAL | 3 refills | Status: DC

## 2019-11-25 NOTE — Patient Instructions (Signed)
Elevate toes above the nose  Limit sodium  Take extra fluid pill daily as needed  Light compression socks to the knees may be helpful

## 2019-11-25 NOTE — Progress Notes (Signed)
Date:  11/25/2019   Name:  Bonnie Jackson   DOB:  [birth year]   MRN:  [id number]   Chief Complaint: Edema (Both feet are swollen and feel numb.Worse at night. In the mornings they are not swollen. )  Hypertension This is a chronic problem. The problem is controlled. Pertinent negatives include no chest pain, headaches, palpitations or shortness of breath. Past treatments include diuretics and ACE inhibitors.   Edema - has swelling in feet and ankles at the end of the day.  Some tingling and numbness that resolves overnight, along with the edema.  She denies excessive salt intake.  She is fairly sedentary.  She has nocturia x 4-5.  No chest pain, PND or orthopnea.  She sleeps comfortably on 2 pillows.  Lab Results  Component Value Date   CREATININE 0.80 01/28/2019   BUN 24 01/28/2019   NA 140 01/28/2019   K 5.0 01/28/2019   CL 101 01/28/2019   CO2 25 01/28/2019   Lab Results  Component Value Date   CHOL 169 01/28/2019   HDL 61 01/28/2019   LDLCALC 84 01/28/2019   TRIG 120 01/28/2019   CHOLHDL 2.8 01/28/2019   Lab Results  Component Value Date   TSH 1.700 08/20/2018   Lab Results  Component Value Date   HGBA1C 6.1 (H) 11/17/2015   Lab Results  Component Value Date   WBC 6.3 08/20/2018   HGB 14.5 08/20/2018   HCT 42.4 08/20/2018   MCV 95 08/20/2018   PLT 303 08/20/2018   Lab Results  Component Value Date   ALT 29 01/28/2019   AST 22 01/28/2019   ALKPHOS 61 01/28/2019   BILITOT 0.4 01/28/2019     Review of Systems  Constitutional: Negative for chills, fatigue and fever.  Eyes: Negative for visual disturbance.  Respiratory: Negative for cough, chest tightness, shortness of breath and wheezing.   Cardiovascular: Positive for leg swelling. Negative for chest pain and palpitations.  Gastrointestinal: Negative for abdominal pain, constipation and diarrhea.  Musculoskeletal: Positive for arthralgias and gait problem.  Allergic/Immunologic: Negative for environmental  allergies.  Neurological: Positive for numbness (in feet when swollen). Negative for dizziness, light-headedness and headaches.  Psychiatric/Behavioral: Negative for sleep disturbance. The patient is not nervous/anxious.     Patient Active Problem List   Diagnosis Date Noted  . Carotid artery stenosis 10/16/2018  . Current moderate episode of major depressive disorder without prior episode ([place]) 08/20/2018  . B12 nutritional deficiency 08/20/2018  . Neuropathy 06/06/2017  . Hearing loss of right ear 06/06/2017  . Gait instability 03/27/2017  . Muscle fasciculation 03/27/2017  . Gastroesophageal reflux disease 03/27/2017  . OAB (overactive bladder) 05/22/2016  . Cataract of right eye 01/19/2016  . Eversion of the eyelid 01/19/2016  . Neoplasm of uncertain behavior of skin 01/19/2016  . Thalamic infarction ([place]) 11/16/2015  . Aneurysm, cerebral, nonruptured 01/04/2015  . Essential (primary) hypertension 01/04/2015  . Mixed hyperlipidemia 01/04/2015  . Idiopathic insomnia 01/04/2015  . Special screening for malignant neoplasms, colon 01/04/2015    No Known Allergies  Past Surgical History:  Procedure Laterality Date  . ABDOMINAL HERNIA REPAIR    . CATARACT EXTRACTION W/PHACO Right 06/24/2016   Procedure: CATARACT EXTRACTION PHACO AND INTRAOCULAR LENS PLACEMENT (IOC);  Surgeon: Ronnell Freshwater, MD;  Location: [facility];  Service: Ophthalmology;  Laterality: Right;  RIGHT  . CATARACT EXTRACTION W/PHACO Left 10/25/2019   Procedure: CATARACT EXTRACTION PHACO AND INTRAOCULAR LENS PLACEMENT (IOC) LEFT 4.00  00:41.9 ;  Surgeon: Eulogio Bear, MD;  Location: [facility];  Service: Ophthalmology;  Laterality: Left;  . CHOLECYSTECTOMY    . IR GENERIC HISTORICAL  01/12/2016   IR ANGIO INTRA EXTRACRAN SEL INTERNAL CAROTID BILAT MOD SED 01/12/2016 Consuella Lose, MD [facility]  . IR GENERIC HISTORICAL  01/12/2016   IR ANGIO VERTEBRAL SEL VERTEBRAL BILAT MOD SED  01/12/2016 Consuella Lose, MD [facility]  . IR GENERIC HISTORICAL  01/12/2016   IR 3D INDEPENDENT [facility]ST 01/12/2016 Consuella Lose, MD [facility]  . REPLACEMENT TOTAL KNEE Bilateral   . TOTAL ABDOMINAL HYSTERECTOMY      Social History   Tobacco Use  . Smoking status: Never Smoker  . Smokeless tobacco: Never Used  . Tobacco comment: smoking cessation materials not required  Substance Use Topics  . Alcohol use: Yes    Alcohol/week: 1.0 standard drinks    Types: 1 Cans of beer per week    Comment:    . Drug use: No     Medication list has been reviewed and updated.  Current Meds  Medication Sig  . aspirin EC 325 MG tablet Take 325 mg by mouth daily.   Marland Kitchen atorvastatin (LIPITOR) 20 MG tablet TAKE 1 TABLET (20 MG TOTAL) BY MOUTH DAILY AT 6 PM. (Patient taking differently: Take 20 mg by mouth daily at 6 PM. Patient takes on and off.)  . Cholecalciferol (VITAMIN D-3) 25 MCG (1000 UT) CAPS Take by mouth daily.  Marland Kitchen latanoprost (XALATAN) 0.005 % ophthalmic solution 1 drop at bedtime.  Marland Kitchen lisinopril-hydrochlorothiazide (ZESTORETIC) 10-12.5 MG tablet Take 1 tablet by mouth daily.  . traZODone (DESYREL) 50 MG tablet TAKE 1 TABLET BY MOUTH EVERYDAY AT BEDTIME    PHQ 2/9 Scores 11/25/2019 02/26/2019 01/28/2019 10/16/2018  PHQ - 2 Score 0 0 0 0  PHQ- 9 Score 0 0 5 4    BP Readings from Last 3 Encounters:  11/25/19 134/62  10/25/19 (!) 158/84  02/26/19 136/64    Physical Exam Vitals and nursing note reviewed.  Constitutional:      General: She is not in acute distress.    Appearance: Normal appearance. She is well-developed.  HENT:     Head: Normocephalic and atraumatic.  Cardiovascular:     Rate and Rhythm: Normal rate and regular rhythm.     Pulses: Normal pulses.     Heart sounds: No murmur.  Pulmonary:     Effort: Pulmonary effort is normal. No respiratory distress.     Breath sounds: Normal breath sounds. No wheezing or rhonchi.  Musculoskeletal:        General: Normal  range of motion.     Cervical back: Normal range of motion.     Right lower leg: Edema (trace ankle) present.     Left lower leg: Edema (trace ankle) present.  Lymphadenopathy:     Cervical: No cervical adenopathy.  Skin:    General: Skin is warm and dry.     Capillary Refill: Capillary refill takes less than 2 seconds.     Findings: No rash.  Neurological:     General: No focal deficit present.     Mental Status: She is alert and oriented to person, place, and time.  Psychiatric:        Attention and Perception: Attention normal.        Mood and Affect: Mood normal.        Behavior: Behavior normal.     Wt Readings from Last 3 Encounters:  11/25/19 188  lb (85.3 kg)  10/25/19 185 lb (83.9 kg)  02/26/19 186 lb (84.4 kg)    BP 134/62   Pulse 70   Temp (!) 96.8 F (36 C) (Temporal)   Ht 5\' 4"  (1.626 m)   Wt 188 lb (85.3 kg)   SpO2 97%   BMI 32.27 kg/m   Assessment and Plan: 1. Essential (primary) hypertension Clinically stable exam with well controlled BP on lisinopril hct. Tolerating medications without side effects at this time. Pt to continue current regimen and low sodium diet; benefits of regular exercise as able discussed. - CBC with Differential/Platelet  2. Venous insufficiency of both lower extremities Recommend elevation, compression stockings, sodium restriction Can take an extra hctz 12.5 mg daily as needed - Comprehensive metabolic panel - hydrochlorothiazide (HYDRODIURIL) 25 MG tablet; Take 0.5 tablets (12.5 mg total) by mouth daily.  Dispense: 45 tablet; Refill: 3  3. Localized edema Rule out thyroid disease and renal insufficiency - TSH   Partially dictated using Editor, commissioning. Any errors are unintentional.  Halina Maidens, MD [facility]  11/25/2019

## 2019-11-26 LAB — CBC WITH DIFFERENTIAL/PLATELET
Basophils Absolute: 0.1 10*3/uL (ref 0.0–0.2)
Basos: 2 %
EOS (ABSOLUTE): 0.4 10*3/uL (ref 0.0–0.4)
Eos: 8 %
Hematocrit: 41.5 % (ref 34.0–46.6)
Hemoglobin: 14.1 g/dL (ref 11.1–15.9)
Immature Grans (Abs): 0 10*3/uL (ref 0.0–0.1)
Immature Granulocytes: 0 %
Lymphocytes Absolute: 1.7 10*3/uL (ref 0.7–3.1)
Lymphs: 30 %
MCH: 31.9 pg (ref 26.6–33.0)
MCHC: 34 g/dL (ref 31.5–35.7)
MCV: 94 fL (ref 79–97)
Monocytes Absolute: 0.7 10*3/uL (ref 0.1–0.9)
Monocytes: 13 %
Neutrophils Absolute: 2.8 10*3/uL (ref 1.4–7.0)
Neutrophils: 47 %
Platelets: 270 10*3/uL (ref 150–450)
RBC: 4.42 x10E6/uL (ref 3.77–5.28)
RDW: 13.2 % (ref 11.7–15.4)
WBC: 5.8 10*3/uL (ref 3.4–10.8)

## 2019-11-26 LAB — COMPREHENSIVE METABOLIC PANEL
ALT: 20 IU/L (ref 0–32)
AST: 21 IU/L (ref 0–40)
Albumin/Globulin Ratio: 1.9 (ref 1.2–2.2)
Albumin: 4.5 g/dL (ref 3.7–4.7)
Alkaline Phosphatase: 63 IU/L (ref 39–117)
BUN/Creatinine Ratio: 29 — ABNORMAL HIGH (ref 12–28)
BUN: 20 mg/dL (ref 8–27)
Bilirubin Total: 0.3 mg/dL (ref 0.0–1.2)
CO2: 24 mmol/L (ref 20–29)
Calcium: 10.8 mg/dL — ABNORMAL HIGH (ref 8.7–10.3)
Chloride: 101 mmol/L (ref 96–106)
Creatinine, Ser: 0.68 mg/dL (ref 0.57–1.00)
GFR calc Af Amer: 97 mL/min/{1.73_m2} (ref >59)
GFR calc non Af Amer: 84 mL/min/{1.73_m2} (ref >59)
Globulin, Total: 2.4 g/dL (ref 1.5–4.5)
Glucose: 119 mg/dL — ABNORMAL HIGH (ref 65–99)
Potassium: 4.6 mmol/L (ref 3.5–5.2)
Sodium: 140 mmol/L (ref 134–144)
Total Protein: 6.9 g/dL (ref 6.0–8.5)

## 2019-11-26 LAB — TSH: TSH: 1.94 u[IU]/mL (ref 0.450–4.500)

## 2019-11-30 LAB — HGB A1C W/O EAG: Hgb A1c MFr Bld: 6.3 % — ABNORMAL HIGH (ref 4.8–5.6)

## 2019-11-30 LAB — SPECIMEN STATUS REPORT

## 2019-12-05 ENCOUNTER — Other Ambulatory Visit: Payer: Self-pay | Admitting: Internal Medicine

## 2019-12-05 DIAGNOSIS — F5101 Primary insomnia: Secondary | ICD-10-CM

## 2019-12-05 NOTE — Telephone Encounter (Signed)
Requested Prescriptions  Pending Prescriptions Disp Refills  . traZODone (DESYREL) 50 MG tablet [Pharmacy Med Name: TRAZODONE 50 MG TABLET] 90 tablet 2    Sig: TAKE 1 TABLET BY MOUTH EVERYDAY AT BEDTIME     Psychiatry: Antidepressants - Serotonin Modulator Passed - 12/05/2019  9:54 AM      Passed - Valid encounter within last 6 months    Recent Outpatient Visits          1 week ago Essential (primary) hypertension   [facility] Glean Hess, MD   9 months ago Gait instability   Waves, MD   10 months ago Mixed hyperlipidemia   [facility] Glean Hess, MD   1 year ago Essential (primary) hypertension   [facility] Glean Hess, MD   1 year ago Essential (primary) hypertension   [facility] Glean Hess, MD      Future Appointments            In 5 months Army Melia Jesse Sans, MD [facility]

## 2020-04-03 ENCOUNTER — Ambulatory Visit: Payer: Self-pay

## 2020-04-03 NOTE — Telephone Encounter (Signed)
Thank you! Glad patient is going to ER to be evaluated for this!  CM

## 2020-04-03 NOTE — Telephone Encounter (Signed)
Pt. And neighbor calling to report BP at 12:00 220/98. Pt. Took 1/2 HCTZ. At 1:30 BP 220/100, pulse 60. No symptoms other than pt."looks pale and hands are clammy." Spoke with Daisy in the practice. Instructed pt. To take 2 BP pills and follow up with Dr. Army Melia. Instructed pt. If BP remains elevated, if she develops symptoms - chest pain, headache, go to ED.Verbalizes understanding. Appointment made for tomorrow at pt. Request.  Reason for Disposition . Systolic BP  >= 211 OR Diastolic >= 173  Answer Assessment - Initial Assessment Questions 1. BLOOD PRESSURE: "What is the blood pressure?" "Did you take at least two measurements 5 minutes apart?"     220/98 12:00  Pulse 60    1:30  230/100 2. ONSET: "When did you take your blood pressure?"     Today 3. HOW: "How did you obtain the blood pressure?" (e.g., visiting nurse, automatic home BP monitor)     Manuel 4. HISTORY: "Do you have a history of high blood pressure?"     Yes 5. MEDICATIONS: "Are you taking any medications for blood pressure?" "Have you missed any doses recently?"     No missed doses 6. OTHER SYMPTOMS: "Do you have any symptoms?" (e.g., headache, chest pain, blurred vision, difficulty breathing, weakness)     Clammy, pale 7. PREGNANCY: "Is there any chance you are pregnant?" "When was your last menstrual period?"     No  Protocols used: BLOOD PRESSURE - HIGH-A-AH

## 2020-04-04 ENCOUNTER — Encounter: Payer: Self-pay | Admitting: Internal Medicine

## 2020-04-04 ENCOUNTER — Ambulatory Visit (INDEPENDENT_AMBULATORY_CARE_PROVIDER_SITE_OTHER): Payer: Medicare Other | Admitting: Internal Medicine

## 2020-04-04 ENCOUNTER — Other Ambulatory Visit: Payer: Self-pay

## 2020-04-04 VITALS — BP 158/68 | HR 94 | Ht 64.0 in | Wt 184.0 lb

## 2020-04-04 DIAGNOSIS — I10 Essential (primary) hypertension: Secondary | ICD-10-CM

## 2020-04-04 MED ORDER — LISINOPRIL-HYDROCHLOROTHIAZIDE 10-12.5 MG PO TABS: 1.0000 | ORAL_TABLET | Freq: Every day | ORAL | 3 refills | Status: DC

## 2020-04-04 NOTE — Progress Notes (Signed)
Date:  04/04/2020   Name:  Bonnie Jackson   DOB:  [birth year]   MRN:  [id number]   Chief Complaint: Hypertension (Concerned about elevated Bp readings. Yesterday it was 200/100. Patient looked pale per neighbor and had clammy hands. Took 2 of her bp pills yesterday to see if this will help lower pressure. )  Hypertension This is a chronic problem. The problem is controlled (but did not take medication for several days). Pertinent negatives include no chest pain, headaches, palpitations or shortness of breath. Past treatments include ACE inhibitors and diuretics. The current treatment provides moderate improvement.  Pt inadvertently stopped lisinopril hct when she was given hctz to take PRN.  It is not clear how long she has been out of medication.  Refills ran out in June.  Lab Results  Component Value Date   CREATININE 0.68 11/25/2019   BUN 20 11/25/2019   NA 140 11/25/2019   K 4.6 11/25/2019   CL 101 11/25/2019   CO2 24 11/25/2019   Lab Results  Component Value Date   CHOL 169 01/28/2019   HDL 61 01/28/2019   LDLCALC 84 01/28/2019   TRIG 120 01/28/2019   CHOLHDL 2.8 01/28/2019   Lab Results  Component Value Date   TSH 1.940 11/25/2019   Lab Results  Component Value Date   HGBA1C 6.3 (H) 11/25/2019   Lab Results  Component Value Date   WBC 5.8 11/25/2019   HGB 14.1 11/25/2019   HCT 41.5 11/25/2019   MCV 94 11/25/2019   PLT 270 11/25/2019   Lab Results  Component Value Date   ALT 20 11/25/2019   AST 21 11/25/2019   ALKPHOS 63 11/25/2019   BILITOT 0.3 11/25/2019     Review of Systems  Constitutional: Negative for chills, fatigue and fever.  HENT: Negative for trouble swallowing.   Eyes: Negative for visual disturbance.  Respiratory: Negative for chest tightness and shortness of breath.   Cardiovascular: Positive for leg swelling. Negative for chest pain and palpitations.  Neurological: Negative for dizziness, light-headedness and headaches.    Patient Active  Problem List   Diagnosis Date Noted  . Carotid artery stenosis 10/16/2018  . B12 nutritional deficiency 08/20/2018  . Neuropathy 06/06/2017  . Hearing loss of right ear 06/06/2017  . Gait instability 03/27/2017  . Muscle fasciculation 03/27/2017  . Gastroesophageal reflux disease 03/27/2017  . OAB (overactive bladder) 05/22/2016  . Cataract of right eye 01/19/2016  . Eversion of the eyelid 01/19/2016  . Neoplasm of uncertain behavior of skin 01/19/2016  . Thalamic infarction ([place]) 11/16/2015  . Aneurysm, cerebral, nonruptured 01/04/2015  . Essential (primary) hypertension 01/04/2015  . Mixed hyperlipidemia 01/04/2015  . Idiopathic insomnia 01/04/2015  . Special screening for malignant neoplasms, colon 01/04/2015    No Known Allergies  Past Surgical History:  Procedure Laterality Date  . ABDOMINAL HERNIA REPAIR    . CATARACT EXTRACTION W/PHACO Right 06/24/2016   Procedure: CATARACT EXTRACTION PHACO AND INTRAOCULAR LENS PLACEMENT (IOC);  Surgeon: Ronnell Freshwater, MD;  Location: [place];  Service: Ophthalmology;  Laterality: Right;  RIGHT  . CATARACT EXTRACTION W/PHACO Left 10/25/2019   Procedure: CATARACT EXTRACTION PHACO AND INTRAOCULAR LENS PLACEMENT (IOC) LEFT 4.00  00:41.9 ;  Surgeon: Eulogio Bear, MD;  Location: [place];  Service: Ophthalmology;  Laterality: Left;  . CHOLECYSTECTOMY    . IR GENERIC HISTORICAL  01/12/2016   IR ANGIO INTRA EXTRACRAN SEL INTERNAL CAROTID BILAT MOD SED 01/12/2016 Consuella Lose, MD  [facility]  . IR GENERIC HISTORICAL  01/12/2016   IR ANGIO VERTEBRAL SEL VERTEBRAL BILAT MOD SED 01/12/2016 Consuella Lose, MD [facility]  . IR GENERIC HISTORICAL  01/12/2016   IR 3D INDEPENDENT WKST 01/12/2016 Consuella Lose, MD [facility]  . REPLACEMENT TOTAL KNEE Bilateral   . TOTAL ABDOMINAL HYSTERECTOMY      Social History   Tobacco Use  . Smoking status: Never Smoker  . Smokeless tobacco: Never Used  . Tobacco  comment: smoking cessation materials not required  Vaping Use  . Vaping Use: Never used  Substance Use Topics  . Alcohol use: Yes    Alcohol/week: 1.0 standard drink    Types: 1 Cans of beer per week    Comment:    . Drug use: No     Medication list has been reviewed and updated.  Current Meds  Medication Sig  . aspirin EC 325 MG tablet Take 325 mg by mouth daily.   Marland Kitchen atorvastatin (LIPITOR) 20 MG tablet TAKE 1 TABLET (20 MG TOTAL) BY MOUTH DAILY AT 6 PM. (Patient taking differently: Take 20 mg by mouth daily at 6 PM. Patient takes on and off.)  . Cholecalciferol (VITAMIN D-3) 25 MCG (1000 UT) CAPS Take by mouth daily.  . hydrochlorothiazide (HYDRODIURIL) 25 MG tablet Take 0.5 tablets (12.5 mg total) by mouth daily.  Marland Kitchen latanoprost (XALATAN) 0.005 % ophthalmic solution 1 drop at bedtime.  Marland Kitchen lisinopril-hydrochlorothiazide (ZESTORETIC) 10-12.5 MG tablet Take 1 tablet by mouth daily.  . traZODone (DESYREL) 50 MG tablet TAKE 1 TABLET BY MOUTH EVERYDAY AT BEDTIME (Patient taking differently: as needed. )  . [DISCONTINUED] lisinopril-hydrochlorothiazide (ZESTORETIC) 10-12.5 MG tablet Take 1 tablet by mouth daily.    PHQ 2/9 Scores 11/25/2019 02/26/2019 01/28/2019 10/16/2018  PHQ - 2 Score 0 0 0 0  PHQ- 9 Score 0 0 5 4    GAD 7 : Generalized Anxiety Score 11/25/2019  Nervous, Anxious, on Edge 0  Control/stop worrying 0  Worry too much - different things 0  Trouble relaxing 0  Restless 0  Easily annoyed or irritable 0  Afraid - awful might happen 0  Total GAD 7 Score 0  Anxiety Difficulty Not difficult at all    BP Readings from Last 3 Encounters:  04/04/20 (!) 158/68  11/25/19 134/62  10/25/19 (!) 158/84    Physical Exam Vitals and nursing note reviewed.  Constitutional:      General: She is not in acute distress.    Appearance: She is well-developed.  HENT:     Head: Normocephalic and atraumatic.  Cardiovascular:     Rate and Rhythm: Normal rate and regular rhythm.      Heart sounds: Normal heart sounds.     Comments: Trace ankle edema  Pulmonary:     Effort: Pulmonary effort is normal. No respiratory distress.     Breath sounds: Normal breath sounds.  Musculoskeletal:        General: Normal range of motion.  Skin:    General: Skin is warm and dry.     Findings: No rash.  Neurological:     Mental Status: She is alert and oriented to person, place, and time.  Psychiatric:        Behavior: Behavior normal.        Thought Content: Thought content normal.     Wt Readings from Last 3 Encounters:  04/04/20 184 lb (83.5 kg)  11/25/19 188 lb (85.3 kg)  10/25/19 185 lb (83.9 kg)  BP (!) 158/68   Pulse 94   Ht 5\' 4"  (1.626 m)   Wt 184 lb (83.5 kg)   SpO2 96%   BMI 31.58 kg/m   Assessment and Plan: 1. Essential (primary) hypertension Resume Zestoretic daily Continue hctz 12.5 mg additionally PRN ankle edema - lisinopril-hydrochlorothiazide (ZESTORETIC) 10-12.5 MG tablet; Take 1 tablet by mouth daily.  Dispense: 90 tablet; Refill: 3   Partially dictated using Editor, commissioning. Any errors are unintentional.  Halina Maidens, MD [facility]  04/04/2020

## 2020-04-04 NOTE — Patient Instructions (Signed)
Take lisinopril hct EVERY DAY  Take 1/2 of HCTZ daily AS NEEDED for ankle/foot swelling.

## 2020-05-03 ENCOUNTER — Encounter: Payer: Self-pay | Admitting: Internal Medicine

## 2020-05-16 DIAGNOSIS — H401131 Primary open-angle glaucoma, bilateral, mild stage: Secondary | ICD-10-CM | POA: Diagnosis not present

## 2020-06-01 ENCOUNTER — Ambulatory Visit (INDEPENDENT_AMBULATORY_CARE_PROVIDER_SITE_OTHER): Payer: Medicare Other | Admitting: Internal Medicine

## 2020-06-01 ENCOUNTER — Encounter: Payer: Self-pay | Admitting: Internal Medicine

## 2020-06-01 ENCOUNTER — Other Ambulatory Visit: Payer: Self-pay

## 2020-06-01 VITALS — BP 130/82 | HR 81 | Temp 99.0°F | Ht 64.0 in | Wt 189.0 lb

## 2020-06-01 DIAGNOSIS — I10 Essential (primary) hypertension: Secondary | ICD-10-CM

## 2020-06-01 DIAGNOSIS — R7303 Prediabetes: Secondary | ICD-10-CM | POA: Diagnosis not present

## 2020-06-01 DIAGNOSIS — I639 Cerebral infarction, unspecified: Secondary | ICD-10-CM

## 2020-06-01 DIAGNOSIS — I6523 Occlusion and stenosis of bilateral carotid arteries: Secondary | ICD-10-CM | POA: Diagnosis not present

## 2020-06-01 DIAGNOSIS — Z23 Encounter for immunization: Secondary | ICD-10-CM

## 2020-06-01 DIAGNOSIS — N3281 Overactive bladder: Secondary | ICD-10-CM | POA: Diagnosis not present

## 2020-06-01 DIAGNOSIS — F5101 Primary insomnia: Secondary | ICD-10-CM

## 2020-06-01 DIAGNOSIS — I6381 Other cerebral infarction due to occlusion or stenosis of small artery: Secondary | ICD-10-CM

## 2020-06-01 NOTE — Progress Notes (Signed)
Date:  06/01/2020   Name:  Bonnie Jackson   DOB:  1941/04/25   MRN:  [id number]   Chief Complaint: Prediabetes (note for mail to get delivered to porch, which med? stroke take 325 or 81 mg?) and Flu Vaccine  Diabetes She presents for her follow-up diabetic visit. Diabetes type: prediabetes. Pertinent negatives for hypoglycemia include no dizziness, headaches or tremors. Pertinent negatives for diabetes include no chest pain, no fatigue and no polyuria. Symptoms are stable. An ACE inhibitor/angiotensin II receptor blocker is being taken.  Hypertension Pertinent negatives include no chest pain, headaches, palpitations or shortness of breath. Past treatments include ACE inhibitors and diuretics. The current treatment provides significant improvement.  OAB - having trouble with bladder control at night.  Hard to get to the bathroom without leaking, usually a large amount.  During the day stays at home and has no trouble making it to the bathroom. Pulsing in head - wakes her up at night, resolves with sitting up.  No headache or ringing in ears.  No dizziness or change in vision. Has CAS - no [place] in 18 months.    Lab Results  Component Value Date   CREATININE 0.68 11/25/2019   BUN 20 11/25/2019   NA 140 11/25/2019   K 4.6 11/25/2019   CL 101 11/25/2019   CO2 24 11/25/2019   Lab Results  Component Value Date   CHOL 169 01/28/2019   HDL 61 01/28/2019   LDLCALC 84 01/28/2019   TRIG 120 01/28/2019   CHOLHDL 2.8 01/28/2019   Lab Results  Component Value Date   TSH 1.940 11/25/2019   Lab Results  Component Value Date   HGBA1C 6.3 (H) 11/25/2019   Lab Results  Component Value Date   WBC 5.8 11/25/2019   HGB 14.1 11/25/2019   HCT 41.5 11/25/2019   MCV 94 11/25/2019   PLT 270 11/25/2019   Lab Results  Component Value Date   ALT 20 11/25/2019   AST 21 11/25/2019   ALKPHOS 63 11/25/2019   BILITOT 0.3 11/25/2019     Review of Systems  Constitutional: Negative for appetite  change, fatigue, fever and unexpected weight change.  HENT: Positive for hearing loss. Negative for tinnitus and trouble swallowing.   Eyes: Negative for visual disturbance (having laser surgery soon).  Respiratory: Negative for cough, chest tightness and shortness of breath.   Cardiovascular: Negative for chest pain, palpitations and leg swelling.  Gastrointestinal: Negative for abdominal pain, constipation and diarrhea.  Endocrine: Negative for polyuria.  Genitourinary: Positive for urgency (mostly at night). Negative for dysuria and hematuria.  Musculoskeletal: Positive for gait problem (uses cane). Negative for arthralgias.  Neurological: Negative for dizziness, tremors, numbness and headaches.       Hearing or feels pulsing in her head  Psychiatric/Behavioral: Positive for sleep disturbance (trazodone helps). Negative for dysphoric mood.    Patient Active Problem List   Diagnosis Date Noted  . Prediabetes 06/01/2020  . Carotid artery stenosis 10/16/2018  . B12 nutritional deficiency 08/20/2018  . Neuropathy 06/06/2017  . Hearing loss of right ear 06/06/2017  . Gait instability 03/27/2017  . Muscle fasciculation 03/27/2017  . Gastroesophageal reflux disease 03/27/2017  . OAB (overactive bladder) 05/22/2016  . Cataract of right eye 01/19/2016  . Eversion of the eyelid 01/19/2016  . Neoplasm of uncertain behavior of skin 01/19/2016  . Thalamic infarction ([place]) 11/16/2015  . Aneurysm, cerebral, nonruptured 01/04/2015  . Essential (primary) hypertension 01/04/2015  . Mixed hyperlipidemia 01/04/2015  .  Idiopathic insomnia 01/04/2015  . Special screening for malignant neoplasms, colon 01/04/2015    No Known Allergies  Past Surgical History:  Procedure Laterality Date  . ABDOMINAL HERNIA REPAIR    . CATARACT EXTRACTION W/PHACO Right 06/24/2016   Procedure: CATARACT EXTRACTION PHACO AND INTRAOCULAR LENS PLACEMENT (IOC);  Surgeon: Ronnell Freshwater, MD;  Location: [place];  Service: Ophthalmology;  Laterality: Right;  RIGHT  . CATARACT EXTRACTION W/PHACO Left 10/25/2019   Procedure: CATARACT EXTRACTION PHACO AND INTRAOCULAR LENS PLACEMENT (IOC) LEFT 4.00  00:41.9 ;  Surgeon: Eulogio Bear, MD;  Location: [place];  Service: Ophthalmology;  Laterality: Left;  . CHOLECYSTECTOMY    . IR GENERIC HISTORICAL  01/12/2016   IR ANGIO INTRA EXTRACRAN SEL INTERNAL CAROTID BILAT MOD SED 01/12/2016 Consuella Lose, MD [facility]  . IR GENERIC HISTORICAL  01/12/2016   IR ANGIO VERTEBRAL SEL VERTEBRAL BILAT MOD SED 01/12/2016 Consuella Lose, MD [facility]  . IR GENERIC HISTORICAL  01/12/2016   IR 3D INDEPENDENT WKST 01/12/2016 Consuella Lose, MD [facility]  . REPLACEMENT TOTAL KNEE Bilateral   . TOTAL ABDOMINAL HYSTERECTOMY      Social History   Tobacco Use  . Smoking status: Never Smoker  . Smokeless tobacco: Never Used  . Tobacco comment: smoking cessation materials not required  Vaping Use  . Vaping Use: Never used  Substance Use Topics  . Alcohol use: Yes    Alcohol/week: 1.0 standard drink    Types: 1 Cans of beer per week    Comment:    . Drug use: No     Medication list has been reviewed and updated.  Current Meds  Medication Sig  . aspirin EC 325 MG tablet Take 325 mg by mouth daily.   Marland Kitchen atorvastatin (LIPITOR) 20 MG tablet TAKE 1 TABLET (20 MG TOTAL) BY MOUTH DAILY AT 6 PM. (Patient taking differently: Take 20 mg by mouth daily at 6 PM. Patient takes on and off.)  . Cholecalciferol (VITAMIN D-3) 25 MCG (1000 UT) CAPS Take by mouth daily.  Marland Kitchen latanoprost (XALATAN) 0.005 % ophthalmic solution 1 drop at bedtime.  Marland Kitchen lisinopril-hydrochlorothiazide (ZESTORETIC) 10-12.5 MG tablet Take 1 tablet by mouth daily.  . traZODone (DESYREL) 50 MG tablet TAKE 1 TABLET BY MOUTH EVERYDAY AT BEDTIME (Patient taking differently: as needed. )    PHQ 2/9 Scores 06/01/2020 11/25/2019 02/26/2019 01/28/2019  PHQ - 2 Score 0 0 0 0  PHQ- 9 Score  1 0 0 5    GAD 7 : Generalized Anxiety Score 06/01/2020 11/25/2019  Nervous, Anxious, on Edge 0 0  Control/stop worrying 0 0  Worry too much - different things 0 0  Trouble relaxing 0 0  Restless 0 0  Easily annoyed or irritable 0 0  Afraid - awful might happen 0 0  Total GAD 7 Score 0 0  Anxiety Difficulty Not difficult at all Not difficult at all    BP Readings from Last 3 Encounters:  06/01/20 130/82  04/04/20 (!) 158/68  11/25/19 134/62    Physical Exam Vitals and nursing note reviewed.  Constitutional:      General: She is not in acute distress.    Appearance: She is well-developed.  HENT:     Head: Normocephalic and atraumatic.  Cardiovascular:     Rate and Rhythm: Normal rate and regular rhythm.     Pulses: Normal pulses.     Heart sounds: No murmur heard.   Pulmonary:     Effort:  Pulmonary effort is normal. No respiratory distress.     Breath sounds: No wheezing or rhonchi.  Musculoskeletal:     Right lower leg: No edema.     Left lower leg: No edema.  Skin:    General: Skin is warm and dry.     Findings: No rash.  Neurological:     General: No focal deficit present.     Mental Status: She is alert and oriented to person, place, and time.     Sensory: Sensation is intact.     Motor: Motor function is intact.     Gait: Gait abnormal (uses cane for stability).  Psychiatric:        Mood and Affect: Mood normal.        Behavior: Behavior normal.     Wt Readings from Last 3 Encounters:  06/01/20 189 lb (85.7 kg)  04/04/20 184 lb (83.5 kg)  11/25/19 188 lb (85.3 kg)    BP 130/82   Pulse 81   Temp 99 F (37.2 C) (Oral)   Ht 5\' 4"  (1.626 m)   Wt 189 lb (85.7 kg)   SpO2 96%   BMI 32.44 kg/m   Assessment and Plan: 1. Prediabetes Continue to work on diet; aim for 5-10 lb weight loss to slow/avoid progression to DM - Hemoglobin A1c  2. Essential (primary) hypertension Clinically stable exam with well controlled BP.  Can hold hctz if needed when  leaving home due to excess urination Tolerating medications without side effects at this time. Pt to continue current regimen and low sodium diet; benefits of regular exercise as able discussed.  3. Thalamic infarction ([facility]) Stable without sequelae Continue ASA 325 mg daily  4. Bilateral carotid artery stenosis Encourage her to take lipitor regularly Due for [place] follow up - [place] Carotid Duplex Bilateral; Future  5. OAB (overactive bladder) Wearing depends at night Try reducing fluid intake 4 hours before bed  6. Idiopathic insomnia Doing fairly well on Trazodone   Partially dictated using Editor, commissioning. Any errors are unintentional.  Halina Maidens, MD [facility]  06/01/2020

## 2020-06-02 LAB — HEMOGLOBIN A1C
Est. average glucose Bld gHb Est-mCnc: 143 mg/dL
Hgb A1c MFr Bld: 6.6 % — ABNORMAL HIGH (ref 4.8–5.6)

## 2020-06-06 ENCOUNTER — Ambulatory Visit
Admission: RE | Admit: 2020-06-06 | Discharge: 2020-06-06 | Disposition: A | Discharge status: Home / Self Care | Payer: Medicare Other | Source: Ambulatory Visit | Attending: Internal Medicine | Admitting: Internal Medicine

## 2020-06-06 ENCOUNTER — Other Ambulatory Visit: Payer: Self-pay

## 2020-06-06 DIAGNOSIS — I6523 Occlusion and stenosis of bilateral carotid arteries: Secondary | ICD-10-CM | POA: Insufficient documentation

## 2020-06-08 DIAGNOSIS — H26491 Other secondary cataract, right eye: Secondary | ICD-10-CM | POA: Diagnosis not present

## 2020-07-14 DIAGNOSIS — H02105 Unspecified ectropion of left lower eyelid: Secondary | ICD-10-CM | POA: Diagnosis not present

## 2020-07-17 ENCOUNTER — Telehealth: Payer: Self-pay | Admitting: Internal Medicine

## 2020-07-17 NOTE — Telephone Encounter (Signed)
Left message for patient to call back and schedule Medicare Annual Wellness Visit (AWV) either virtually or in office. Whichever the patients preference is.  Last AWV 03/30/18; please schedule at anytime with [facility] Health Advisor.  This should be a 40 minute visit.

## 2020-08-28 ENCOUNTER — Telehealth: Payer: Self-pay

## 2020-08-28 NOTE — Telephone Encounter (Signed)
Called pt let her know that she would have to wait until her appt to discuss her medication (atorvastatin). Pt verbalized understanding. Pt also asked about getting a walker. Placed pt on hold to discuss the walker pt hung up.   Will route result note to [facility] Nurse Triage for follow up when patient returns call to clinic. Nurse may give results to patient if they return call. CRM created for this message.    KP

## 2020-08-28 NOTE — Telephone Encounter (Unsigned)
Copied from [place] [id number]. Topic: Quick Communication - See Telephone Encounter >> Aug 28, 2020  9:41 AM Loma Boston wrote: CRM for notification. See Telephone encounter for: 08/28/20.atorvastatin (LIPITOR) 20 MG tablet 90 tablet 1 10/22/2018   Sig - Route: TAKE 1 TABLET (20 MG TOTAL) BY MOUTH DAILY AT 6 PM. - Oral  Pt states that she called pharmacy and this is denied. Pt last refill was March 2020 . Pt has an appt for 2/24 and was wanting enough to get her to her appt? Pt wants a cb for nurse  [phone]. Pt had an appt in oct of 2021 FU

## 2020-09-28 ENCOUNTER — Encounter: Payer: Self-pay | Admitting: Internal Medicine

## 2020-09-28 ENCOUNTER — Ambulatory Visit (INDEPENDENT_AMBULATORY_CARE_PROVIDER_SITE_OTHER): Payer: Medicare Other | Admitting: Internal Medicine

## 2020-09-28 ENCOUNTER — Other Ambulatory Visit: Payer: Self-pay

## 2020-09-28 VITALS — BP 120/58 | HR 82 | Temp 98.1°F | Ht 64.0 in | Wt 183.0 lb

## 2020-09-28 DIAGNOSIS — R7303 Prediabetes: Secondary | ICD-10-CM

## 2020-09-28 DIAGNOSIS — Z1231 Encounter for screening mammogram for malignant neoplasm of breast: Secondary | ICD-10-CM

## 2020-09-28 DIAGNOSIS — E782 Mixed hyperlipidemia: Secondary | ICD-10-CM

## 2020-09-28 DIAGNOSIS — I639 Cerebral infarction, unspecified: Secondary | ICD-10-CM | POA: Diagnosis not present

## 2020-09-28 DIAGNOSIS — I6381 Other cerebral infarction due to occlusion or stenosis of small artery: Secondary | ICD-10-CM

## 2020-09-28 MED ORDER — ATORVASTATIN CALCIUM 20 MG PO TABS: 20.0000 mg | ORAL_TABLET | Freq: Every day | ORAL | 1 refills | Status: DC

## 2020-09-28 NOTE — Progress Notes (Signed)
Date:  09/28/2020   Name:  Bonnie Jackson   DOB:  Oct 11, 1940   MRN:  [id number]   Chief Complaint: Diabetes and Hypertension  Diabetes She presents for her follow-up diabetic visit. Diabetes type: prediabetes. Her disease course has been stable. Hypoglycemia symptoms include nervousness/anxiousness. Pertinent negatives for hypoglycemia include no dizziness or headaches. Pertinent negatives for diabetes include no chest pain, no fatigue and no weakness. Symptoms are stable. Current diabetic treatment includes diet. She is compliant with treatment most of the time. Her weight is stable (has lost a few pounds). She is following a generally healthy diet. An ACE inhibitor/angiotensin II receptor blocker is being taken.  Hypertension This is a chronic problem. The problem is controlled. Pertinent negatives include no chest pain, headaches, palpitations or shortness of breath. Past treatments include ACE inhibitors and diuretics (second dose of hctz added last visit has helped). There are no compliance problems.     Lab Results  Component Value Date   CREATININE 0.68 11/25/2019   BUN 20 11/25/2019   NA 140 11/25/2019   K 4.6 11/25/2019   CL 101 11/25/2019   CO2 24 11/25/2019   Lab Results  Component Value Date   CHOL 169 01/28/2019   HDL 61 01/28/2019   LDLCALC 84 01/28/2019   TRIG 120 01/28/2019   CHOLHDL 2.8 01/28/2019   Lab Results  Component Value Date   TSH 1.940 11/25/2019   Lab Results  Component Value Date   HGBA1C 6.6 (H) 06/01/2020   Lab Results  Component Value Date   WBC 5.8 11/25/2019   HGB 14.1 11/25/2019   HCT 41.5 11/25/2019   MCV 94 11/25/2019   PLT 270 11/25/2019   Lab Results  Component Value Date   ALT 20 11/25/2019   AST 21 11/25/2019   ALKPHOS 63 11/25/2019   BILITOT 0.3 11/25/2019     Review of Systems  Constitutional: Negative for fatigue and unexpected weight change.  HENT: Negative for nosebleeds.   Eyes: Negative for visual disturbance.   Respiratory: Negative for cough, chest tightness, shortness of breath and wheezing.   Cardiovascular: Negative for chest pain, palpitations and leg swelling.  Gastrointestinal: Negative for abdominal pain, constipation and diarrhea.  Musculoskeletal: Positive for arthralgias, back pain and gait problem. Negative for myalgias.  Neurological: Negative for dizziness, weakness, light-headedness and headaches.  Psychiatric/Behavioral: Positive for dysphoric mood. Negative for sleep disturbance. The patient is nervous/anxious.     Patient Active Problem List   Diagnosis Date Noted  . Prediabetes 06/01/2020  . Carotid artery stenosis 10/16/2018  . B12 nutritional deficiency 08/20/2018  . Neuropathy 06/06/2017  . Hearing loss of right ear 06/06/2017  . Gait instability 03/27/2017  . Muscle fasciculation 03/27/2017  . Gastroesophageal reflux disease 03/27/2017  . OAB (overactive bladder) 05/22/2016  . Cataract of right eye 01/19/2016  . Eversion of the eyelid 01/19/2016  . Neoplasm of uncertain behavior of skin 01/19/2016  . Thalamic infarction ([place]) 11/16/2015  . Aneurysm, cerebral, nonruptured 01/04/2015  . Essential (primary) hypertension 01/04/2015  . Mixed hyperlipidemia 01/04/2015  . Idiopathic insomnia 01/04/2015  . Special screening for malignant neoplasms, colon 01/04/2015    No Known Allergies  Past Surgical History:  Procedure Laterality Date  . ABDOMINAL HERNIA REPAIR    . CATARACT EXTRACTION W/PHACO Right 06/24/2016   Procedure: CATARACT EXTRACTION PHACO AND INTRAOCULAR LENS PLACEMENT ([facility]);  Surgeon: Ronnell Freshwater, MD;  Location: [place];  Service: Ophthalmology;  Laterality: Right;  RIGHT  .  CATARACT EXTRACTION W/PHACO Left 10/25/2019   Procedure: CATARACT EXTRACTION PHACO AND INTRAOCULAR LENS PLACEMENT ([facility]) LEFT 4.00  00:41.9 ;  Surgeon: Eulogio Bear, MD;  Location: [place];  Service: Ophthalmology;  Laterality: Left;  .  CHOLECYSTECTOMY    . IR GENERIC HISTORICAL  01/12/2016   IR ANGIO INTRA EXTRACRAN SEL INTERNAL CAROTID BILAT MOD SED 01/12/2016 Consuella Lose, MD [facility]  . IR GENERIC HISTORICAL  01/12/2016   IR ANGIO VERTEBRAL SEL VERTEBRAL BILAT MOD SED 01/12/2016 Consuella Lose, MD [facility]  . IR GENERIC HISTORICAL  01/12/2016   IR 3D INDEPENDENT WKST 01/12/2016 Consuella Lose, MD [facility]  . REPLACEMENT TOTAL KNEE Bilateral   . TOTAL ABDOMINAL HYSTERECTOMY      Social History   Tobacco Use  . Smoking status: Never Smoker  . Smokeless tobacco: Never Used  . Tobacco comment: smoking cessation materials not required  Vaping Use  . Vaping Use: Never used  Substance Use Topics  . Alcohol use: Yes    Alcohol/week: 1.0 standard drink    Types: 1 Cans of beer per week    Comment:    . Drug use: No     Medication list has been reviewed and updated.  Current Meds  Medication Sig  . aspirin EC 325 MG tablet Take 325 mg by mouth daily.   . Cholecalciferol (VITAMIN D-3) 25 MCG (1000 UT) CAPS Take by mouth daily.  . hydrochlorothiazide (HYDRODIURIL) 25 MG tablet Take 0.5 tablets (12.5 mg total) by mouth daily.  Marland Kitchen latanoprost (XALATAN) 0.005 % ophthalmic solution 1 drop at bedtime.  Marland Kitchen lisinopril-hydrochlorothiazide (ZESTORETIC) 10-12.5 MG tablet Take 1 tablet by mouth daily.  . traZODone (DESYREL) 50 MG tablet TAKE 1 TABLET BY MOUTH EVERYDAY AT BEDTIME (Patient taking differently: as needed.)  . [DISCONTINUED] atorvastatin (LIPITOR) 20 MG tablet TAKE 1 TABLET (20 MG TOTAL) BY MOUTH DAILY AT 6 PM. (Patient taking differently: Take 20 mg by mouth daily at 6 PM. Patient takes on and off.)    PHQ 2/9 Scores 09/28/2020 06/01/2020 11/25/2019 02/26/2019  PHQ - 2 Score 6 0 0 0  PHQ- 9 Score 9 1 0 0    GAD 7 : Generalized Anxiety Score 09/28/2020 06/01/2020 11/25/2019  Nervous, Anxious, on Edge 2 0 0  Control/stop worrying 2 0 0  Worry too much - different things 2 0 0  Trouble relaxing 3 0 0   Restless 3 0 0  Easily annoyed or irritable 0 0 0  Afraid - awful might happen 0 0 0  Total GAD 7 Score 12 0 0  Anxiety Difficulty Not difficult at all Not difficult at all Not difficult at all    BP Readings from Last 3 Encounters:  09/28/20 (!) 120/58  06/01/20 130/82  04/04/20 (!) 158/68    Physical Exam Vitals and nursing note reviewed.  Constitutional:      General: She is not in acute distress.    Appearance: She is well-developed.  HENT:     Head: Normocephalic and atraumatic.  Cardiovascular:     Rate and Rhythm: Normal rate and regular rhythm.     Pulses: Normal pulses.  Pulmonary:     Effort: Pulmonary effort is normal. No respiratory distress.     Breath sounds: No wheezing or rhonchi.  Musculoskeletal:     Cervical back: Normal range of motion.     Right lower leg: No edema.     Left lower leg: No edema.  Lymphadenopathy:  Cervical: No cervical adenopathy.  Skin:    General: Skin is warm and dry.     Findings: No rash.  Neurological:     Mental Status: She is alert and oriented to person, place, and time.     Gait: Gait abnormal (uses a cane).  Psychiatric:        Attention and Perception: Attention normal.        Mood and Affect: Mood normal.        Behavior: Behavior normal.     Wt Readings from Last 3 Encounters:  09/28/20 183 lb (83 kg)  06/01/20 189 lb (85.7 kg)  04/04/20 184 lb (83.5 kg)    BP (!) 120/58   Pulse 82   Temp 98.1 F (36.7 C) (Oral)   Ht 5\' 4"  (1.626 m)   Wt 183 lb (83 kg)   SpO2 98%   BMI 31.41 kg/m   Assessment and Plan: 1. Thalamic infarction ([place]) With carotid artery stenosis ~ 50% No new deficits - continue statin daily as discussed along with ASA 235 mg - atorvastatin (LIPITOR) 20 MG tablet; Take 1 tablet (20 mg total) by mouth daily at 6 PM.  Dispense: 90 tablet; Refill: 1  2. Mixed hyperlipidemia Tolerating medication well without side effects Pt requests postponing labs to next visit - atorvastatin  (LIPITOR) 20 MG tablet; Take 1 tablet (20 mg total) by mouth daily at 6 PM.  Dispense: 90 tablet; Refill: 1  3. Encounter for screening mammogram for breast cancer Schedule at [facility]; Future  4. Prediabetes Continue diet efforts; stay as active as possible Repeat A1C next visit   Partially dictated using Dragon software. Any errors are unintentional.  Halina Maidens, MD [facility]  09/28/2020

## 2020-11-29 ENCOUNTER — Other Ambulatory Visit: Payer: Self-pay | Admitting: Internal Medicine

## 2020-11-29 DIAGNOSIS — I872 Venous insufficiency (chronic) (peripheral): Secondary | ICD-10-CM

## 2020-11-29 NOTE — Telephone Encounter (Signed)
Requested medications are due for refill today.  yes  Requested medications are on the active medications list.  yes  Last refill. 11/25/2019  Future visit scheduled.   yes  Notes to clinic.  Rx is expired.

## 2020-12-19 DIAGNOSIS — H401131 Primary open-angle glaucoma, bilateral, mild stage: Secondary | ICD-10-CM | POA: Diagnosis not present

## 2021-03-01 ENCOUNTER — Other Ambulatory Visit: Payer: Self-pay | Admitting: Internal Medicine

## 2021-03-01 DIAGNOSIS — I10 Essential (primary) hypertension: Secondary | ICD-10-CM

## 2021-03-01 NOTE — Telephone Encounter (Signed)
   Notes to clinic:  Verify for continued use  Patient also have the hydrochlorothiazide 25 mg on chart as well    Requested Prescriptions  Pending Prescriptions Disp Refills   lisinopril-hydrochlorothiazide (ZESTORETIC) 10-12.5 MG tablet [Pharmacy Med Name: LISINOPRIL-HCTZ 10-12.5 MG TAB] 90 tablet 0    Sig: TAKE 1 TABLET BY MOUTH EVERY DAY      Cardiovascular:  ACEI + Diuretic Combos Failed - 03/01/2021  2:47 PM      Failed - Na in normal range and within 180 days    Sodium  Date Value Ref Range Status  11/25/2019 140 134 - 144 mmol/L Final          Failed - K in normal range and within 180 days    Potassium  Date Value Ref Range Status  11/25/2019 4.6 3.5 - 5.2 mmol/L Final          Failed - Cr in normal range and within 180 days    Creatinine, Ser  Date Value Ref Range Status  11/25/2019 0.68 0.57 - 1.00 mg/dL Final          Failed - Ca in normal range and within 180 days    Calcium  Date Value Ref Range Status  11/25/2019 10.8 (H) 8.7 - 10.3 mg/dL Final          Passed - Patient is not pregnant      Passed - Last BP in normal range    BP Readings from Last 1 Encounters:  09/28/20 (!) 120/58          Passed - Valid encounter within last 6 months    Recent Outpatient Visits           5 months ago Encounter for screening mammogram for breast cancer   [facility] Glean Hess, MD   9 months ago Prediabetes   [facility] Glean Hess, MD   11 months ago Essential (primary) hypertension   [facility] Glean Hess, MD   1 year ago Essential (primary) hypertension   [facility] Glean Hess, MD   2 years ago Gait instability   [facility] Glean Hess, MD       Future Appointments             In 4 weeks Army Melia Jesse Sans, MD [facility], [facility]

## 2021-03-17 ENCOUNTER — Other Ambulatory Visit: Payer: Self-pay | Admitting: Internal Medicine

## 2021-03-17 DIAGNOSIS — I10 Essential (primary) hypertension: Secondary | ICD-10-CM

## 2021-03-17 NOTE — Telephone Encounter (Signed)
Requested medication (s) are due for refill today: yes  Requested medication (s) are on the active medication list: yes  Last refill:  04/04/20 #90 3 RF  Future visit scheduled: yes  Notes to clinic:  overdue lab work   Requested Prescriptions  Pending Prescriptions Disp Refills   lisinopril-hydrochlorothiazide (ZESTORETIC) 10-12.5 MG tablet [Pharmacy Med Name: LISINOPRIL-HCTZ 10-12.5 MG TAB] 90 tablet 3    Sig: TAKE 1 TABLET BY MOUTH EVERY DAY     Cardiovascular:  ACEI + Diuretic Combos Failed - 03/17/2021 10:04 AM      Failed - Na in normal range and within 180 days    Sodium  Date Value Ref Range Status  11/25/2019 140 134 - 144 mmol/L Final          Failed - K in normal range and within 180 days    Potassium  Date Value Ref Range Status  11/25/2019 4.6 3.5 - 5.2 mmol/L Final          Failed - Cr in normal range and within 180 days    Creatinine, Ser  Date Value Ref Range Status  11/25/2019 0.68 0.57 - 1.00 mg/dL Final          Failed - Ca in normal range and within 180 days    Calcium  Date Value Ref Range Status  11/25/2019 10.8 (H) 8.7 - 10.3 mg/dL Final          Passed - Patient is not pregnant      Passed - Last BP in normal range    BP Readings from Last 1 Encounters:  09/28/20 (!) 120/58          Passed - Valid encounter within last 6 months    Recent Outpatient Visits           5 months ago Encounter for screening mammogram for breast cancer   [facility] Glean Hess, MD   9 months ago Prediabetes   [facility] Glean Hess, MD   11 months ago Essential (primary) hypertension   [facility] Glean Hess, MD   1 year ago Essential (primary) hypertension   [facility] Glean Hess, MD   2 years ago Gait instability   [facility] Glean Hess, MD       Future Appointments             In 1 week Army Melia Jesse Sans, MD [facility], [facility]

## 2021-03-19 NOTE — Telephone Encounter (Signed)
Pt needs appt for DM, HTN and cholesterol with fasting labs.  KP

## 2021-03-22 ENCOUNTER — Other Ambulatory Visit: Payer: Self-pay | Admitting: Internal Medicine

## 2021-03-22 DIAGNOSIS — I639 Cerebral infarction, unspecified: Secondary | ICD-10-CM

## 2021-03-22 DIAGNOSIS — I6381 Other cerebral infarction due to occlusion or stenosis of small artery: Secondary | ICD-10-CM

## 2021-03-22 DIAGNOSIS — E782 Mixed hyperlipidemia: Secondary | ICD-10-CM

## 2021-03-22 NOTE — Telephone Encounter (Signed)
Requested medications are due for refill today.  yes  Requested medications are on the active medications list.  yes  Last refill. 09/28/2020  Future visit scheduled.   yes  Notes to clinic.  Patient is overdue for labs.

## 2021-03-26 ENCOUNTER — Ambulatory Visit: Payer: Self-pay | Admitting: *Deleted

## 2021-03-26 NOTE — Telephone Encounter (Signed)
Called pt she refused to go to the ER pre PEC. Told pt she has an appt on 8/25 pt stated that she "still can not drive and she will see if she can make it to the appt". Told pt at the appt on 8/25 we could discuss her weakness as well. Told pt to go to ER if she feels worse due to her not being able to drive. Pt verbalized understanding,  KP

## 2021-03-26 NOTE — Telephone Encounter (Signed)
Pt reports severe generalizes weakness. States onset 2 weeks ago with covid, has worsened. States "Can't get into kitchen for water" Fell in BR this AM, can't walk. States called EMS yesterday, [facility], states was told of long wait and best to not go." States "I made the decision to not go to ED." Denies any injuries from fall "But my head was wedged between commode and wall, son helped me up." Denies dizziness, states urinating as usual, states urine is not darker "But I know Im not drinking much, I can't get to it." Just weak."  States "I just need some help getting things for me."  Advised ED, states will follow disposition.

## 2021-03-26 NOTE — Telephone Encounter (Signed)
Reason for Disposition  Patient sounds very sick or weak to the triager  Answer Assessment - Initial Assessment Questions 1. DESCRIPTION: "Describe how you are feeling."     Generalized weakness, "Can't get to BR or kitchen." 2. SEVERITY: "How bad is it?"  "Can you stand and walk?"   - MILD - Feels weak or tired, but does not interfere with work, school or normal activities   - [place] to stand and walk; weakness interferes with work, school, or normal activities   - SEVERE - Unable to stand or walk     Severe 3. ONSET:  "When did the weakness begin?"     Covid 2 weeks ago, worsened 4. CAUSE: "What do you think is causing the weakness?"     Unsure 5. MEDICINES: "Have you recently started a new medicine or had a change in the amount of a medicine?"     no 6. OTHER SYMPTOMS: "Do you have any other symptoms?" (e.g., chest pain, fever, cough, SOB, vomiting, diarrhea, bleeding, other areas of pain)     States urinating as usual, not darker;  Fell this AM in BR. "No injuries."  Protocols used: Weakness (Generalized) and Fatigue-A-AH

## 2021-03-29 ENCOUNTER — Ambulatory Visit (INDEPENDENT_AMBULATORY_CARE_PROVIDER_SITE_OTHER): Payer: Medicare Other | Admitting: Internal Medicine

## 2021-03-29 ENCOUNTER — Encounter: Payer: Self-pay | Admitting: Internal Medicine

## 2021-03-29 ENCOUNTER — Other Ambulatory Visit: Payer: Self-pay

## 2021-03-29 VITALS — BP 124/62 | HR 70 | Temp 98.3°F | Ht 64.0 in | Wt 170.0 lb

## 2021-03-29 DIAGNOSIS — I10 Essential (primary) hypertension: Secondary | ICD-10-CM | POA: Diagnosis not present

## 2021-03-29 DIAGNOSIS — U071 COVID-19: Secondary | ICD-10-CM

## 2021-03-29 DIAGNOSIS — I639 Cerebral infarction, unspecified: Secondary | ICD-10-CM

## 2021-03-29 DIAGNOSIS — E782 Mixed hyperlipidemia: Secondary | ICD-10-CM | POA: Diagnosis not present

## 2021-03-29 DIAGNOSIS — R7303 Prediabetes: Secondary | ICD-10-CM

## 2021-03-29 NOTE — Progress Notes (Signed)
Date:  03/29/2021   Name:  Bonnie Jackson   DOB:  [birth year]   MRN:  [id number]   Chief Complaint: Hypertension, Diabetes, Hyperlipidemia, and Fatigue (Had covid 2.5 weeks ago, still having weakness )  Hypertension This is a chronic problem. The problem is controlled. Pertinent negatives include no chest pain, headaches or shortness of breath. Past treatments include ACE inhibitors and diuretics.  Diabetes She presents for her follow-up diabetic visit. She has type 2 diabetes mellitus. Pertinent negatives for hypoglycemia include no dizziness, headaches or tremors. Associated symptoms include fatigue. Pertinent negatives for diabetes include no chest pain.  Hyperlipidemia Pertinent negatives include no chest pain or shortness of breath.  Covid - diagnosed about 3 weeks ago.  Did not seek care.  Had fever and some cough, fatigue.  Back to eating and drinking normally but still very fatigued.  No SOB or chest pain, no further fever.  No diarrhea or vomiting.  Lab Results  Component Value Date   CREATININE 0.68 11/25/2019   BUN 20 11/25/2019   NA 140 11/25/2019   K 4.6 11/25/2019   CL 101 11/25/2019   CO2 24 11/25/2019   Lab Results  Component Value Date   CHOL 169 01/28/2019   HDL 61 01/28/2019   LDLCALC 84 01/28/2019   TRIG 120 01/28/2019   CHOLHDL 2.8 01/28/2019   Lab Results  Component Value Date   TSH 1.940 11/25/2019   Lab Results  Component Value Date   HGBA1C 6.6 (H) 06/01/2020   Lab Results  Component Value Date   WBC 5.8 11/25/2019   HGB 14.1 11/25/2019   HCT 41.5 11/25/2019   MCV 94 11/25/2019   PLT 270 11/25/2019   Lab Results  Component Value Date   ALT 20 11/25/2019   AST 21 11/25/2019   ALKPHOS 63 11/25/2019   BILITOT 0.3 11/25/2019     Review of Systems  Constitutional:  Positive for fatigue and unexpected weight change. Negative for appetite change, chills, diaphoresis and fever.  Respiratory:  Negative for cough, chest tightness and shortness  of breath.   Cardiovascular:  Negative for chest pain and leg swelling.  Gastrointestinal:  Negative for diarrhea, nausea and vomiting.  Neurological:  Positive for light-headedness. Negative for dizziness, tremors, syncope and headaches.  Psychiatric/Behavioral:  Negative for sleep disturbance.    Patient Active Problem List   Diagnosis Date Noted   Prediabetes 06/01/2020   Carotid artery stenosis 10/16/2018   B12 nutritional deficiency 08/20/2018   Neuropathy 06/06/2017   Hearing loss of right ear 06/06/2017   Gait instability 03/27/2017   Muscle fasciculation 03/27/2017   Gastroesophageal reflux disease 03/27/2017   OAB (overactive bladder) 05/22/2016   Cataract of right eye 01/19/2016   Eversion of the eyelid 01/19/2016   Neoplasm of uncertain behavior of skin 01/19/2016   Thalamic infarction ([place]) 11/16/2015   Aneurysm, cerebral, nonruptured 01/04/2015   Essential (primary) hypertension 01/04/2015   Mixed hyperlipidemia 01/04/2015   Idiopathic insomnia 01/04/2015   Special screening for malignant neoplasms, colon 01/04/2015    No Known Allergies  Past Surgical History:  Procedure Laterality Date   ABDOMINAL HERNIA REPAIR     CATARACT EXTRACTION W/PHACO Right 06/24/2016   Procedure: CATARACT EXTRACTION PHACO AND INTRAOCULAR LENS PLACEMENT ([place]);  Surgeon: Ronnell Freshwater, MD;  Location: [place];  Service: Ophthalmology;  Laterality: Right;  RIGHT   CATARACT EXTRACTION W/PHACO Left 10/25/2019   Procedure: CATARACT EXTRACTION PHACO AND INTRAOCULAR LENS PLACEMENT (I[facility]) LEFT 4.00  00:41.9 ;  Surgeon: Eulogio Bear, MD;  Location: [place];  Service: Ophthalmology;  Laterality: Left;   CHOLECYSTECTOMY     IR GENERIC HISTORICAL  01/12/2016   IR ANGIO INTRA EXTRACRAN SEL INTERNAL CAROTID BILAT MOD SED 01/12/2016 Consuella Lose, MD [facility]   IR GENERIC HISTORICAL  01/12/2016   IR ANGIO VERTEBRAL SEL VERTEBRAL BILAT MOD SED 01/12/2016 Consuella Lose, MD [facility]   IR GENERIC HISTORICAL  01/12/2016   IR 3D INDEPENDENT WKST 01/12/2016 Consuella Lose, MD [facility]   REPLACEMENT TOTAL KNEE Bilateral    TOTAL ABDOMINAL HYSTERECTOMY      Social History   Tobacco Use   Smoking status: Never   Smokeless tobacco: Never   Tobacco comments:    smoking cessation materials not required  Vaping Use   Vaping Use: Never used  Substance Use Topics   Alcohol use: Yes    Alcohol/week: 1.0 standard drink    Types: 1 Cans of beer per week    Comment:     Drug use: No     Medication list has been reviewed and updated.  Current Meds  Medication Sig   aspirin EC 325 MG tablet Take 325 mg by mouth daily.    atorvastatin (LIPITOR) 20 MG tablet Take 1 tablet (20 mg total) by mouth daily at 6 PM.   hydrochlorothiazide (HYDRODIURIL) 25 MG tablet TAKE 1/2 TABLET BY MOUTH EVERY DAY   latanoprost (XALATAN) 0.005 % ophthalmic solution 1 drop at bedtime.   lisinopril-hydrochlorothiazide (ZESTORETIC) 10-12.5 MG tablet TAKE 1 TABLET BY MOUTH EVERY DAY    PHQ 2/9 Scores 03/29/2021 09/28/2020 06/01/2020 11/25/2019  PHQ - 2 Score 0 6 0 0  PHQ- 9 Score '3 9 1 '$ 0    GAD 7 : Generalized Anxiety Score 03/29/2021 09/28/2020 06/01/2020 11/25/2019  Nervous, Anxious, on Edge 0 2 0 0  Control/stop worrying 0 2 0 0  Worry too much - different things 0 2 0 0  Trouble relaxing 0 3 0 0  Restless 0 3 0 0  Easily annoyed or irritable 0 0 0 0  Afraid - awful might happen 0 0 0 0  Total GAD 7 Score 0 12 0 0  Anxiety Difficulty - Not difficult at all Not difficult at all Not difficult at all    BP Readings from Last 3 Encounters:  03/29/21 124/62  09/28/20 (!) 120/58  06/01/20 130/82    Physical Exam Constitutional:      General: She is not in acute distress.    Comments: Appears mildly fatigued  Neck:     Vascular: No carotid bruit.  Cardiovascular:     Rate and Rhythm: Normal rate and regular rhythm.  Pulmonary:     Effort: Pulmonary  effort is normal.     Breath sounds: Normal breath sounds. No wheezing or rhonchi.  Musculoskeletal:     Cervical back: Normal range of motion.     Right lower leg: No edema.     Left lower leg: No edema.  Lymphadenopathy:     Cervical: No cervical adenopathy.  Skin:    General: Skin is warm.  Neurological:     General: No focal deficit present.     Mental Status: She is alert.  Psychiatric:        Mood and Affect: Mood normal.    Wt Readings from Last 3 Encounters:  03/29/21 170 lb (77.1 kg)  09/28/20 183 lb (83 kg)  06/01/20 189 lb (85.7 kg)  BP 124/62   Pulse 70   Temp 98.3 F (36.8 C) (Oral)   Ht '5\' 4"'$  (1.626 m)   Wt 170 lb (77.1 kg)   SpO2 96%   BMI 29.18 kg/m   Assessment and Plan: 1. Essential (primary) hypertension Clinically stable exam with well controlled BP. Tolerating medications without side effects at this time. Pt to continue current regimen and low sodium diet; benefits of regular exercise as able discussed. - CBC with Differential/Platelet - Comprehensive metabolic panel  2. Mixed hyperlipidemia Tolerating statin medication without side effects at this time LDL is at goal of < 70 on current dose Continue same therapy without change at this time. - Lipid panel  3. Prediabetes Has lost 15 lbs with illness Continue healthy diet, sufficient fluids - Hemoglobin A1c  4. COVID-19 Primary sx have resolved; now left with fatigue but no other specific sx She was not treated with anti-virals since she chose not to go to the ED and did not call here for advice. Exam is benign today other than fatigue. Encourage pt to consume fluids, rest as needed Monitor for SOB, cough, recurrent fever  She does not want to get labs done today -son has to take off of work She will try to do labs next week. Partially dictated using Editor, commissioning. Any errors are unintentional.  Halina Maidens, MD [facility]  03/29/2021

## 2021-04-08 ENCOUNTER — Telehealth: Payer: Self-pay | Admitting: *Deleted

## 2021-04-08 NOTE — Telephone Encounter (Signed)
Unable to reach patient to sch AWV for Medicare. LMOM

## 2021-04-12 ENCOUNTER — Telehealth: Payer: Self-pay

## 2021-04-12 DIAGNOSIS — R7303 Prediabetes: Secondary | ICD-10-CM | POA: Diagnosis not present

## 2021-04-12 DIAGNOSIS — E782 Mixed hyperlipidemia: Secondary | ICD-10-CM | POA: Diagnosis not present

## 2021-04-12 DIAGNOSIS — I10 Essential (primary) hypertension: Secondary | ICD-10-CM | POA: Diagnosis not present

## 2021-04-12 NOTE — Telephone Encounter (Signed)
-----   Message from Glean Hess, MD sent at 04/11/2021  3:32 PM EDT ----- She needs to get her labs done before the end of September.  I am not sure why she has not yet done these.  Please give her a call.

## 2021-04-12 NOTE — Telephone Encounter (Signed)
Spoke to pt about getting labs done. Pt stated she got her labs done today.  KP

## 2021-04-13 ENCOUNTER — Other Ambulatory Visit: Payer: Self-pay

## 2021-04-13 LAB — COMPREHENSIVE METABOLIC PANEL
ALT: 16 IU/L (ref 0–32)
AST: 16 IU/L (ref 0–40)
Albumin/Globulin Ratio: 1.7 (ref 1.2–2.2)
Albumin: 4.5 g/dL (ref 3.7–4.7)
Alkaline Phosphatase: 57 IU/L (ref 44–121)
BUN/Creatinine Ratio: 27 (ref 12–28)
BUN: 21 mg/dL (ref 8–27)
Bilirubin Total: 0.5 mg/dL (ref 0.0–1.2)
CO2: 24 mmol/L (ref 20–29)
Calcium: 11 mg/dL — ABNORMAL HIGH (ref 8.7–10.3)
Chloride: 101 mmol/L (ref 96–106)
Creatinine, Ser: 0.78 mg/dL (ref 0.57–1.00)
Globulin, Total: 2.7 g/dL (ref 1.5–4.5)
Glucose: 119 mg/dL — ABNORMAL HIGH (ref 65–99)
Potassium: 5.3 mmol/L — ABNORMAL HIGH (ref 3.5–5.2)
Sodium: 138 mmol/L (ref 134–144)
Total Protein: 7.2 g/dL (ref 6.0–8.5)
eGFR: 77 mL/min/{1.73_m2} (ref >59)

## 2021-04-13 LAB — CBC WITH DIFFERENTIAL/PLATELET
Basophils Absolute: 0.1 10*3/uL (ref 0.0–0.2)
Basos: 1 %
EOS (ABSOLUTE): 0.1 10*3/uL (ref 0.0–0.4)
Eos: 2 %
Hematocrit: 37.9 % (ref 34.0–46.6)
Hemoglobin: 12.7 g/dL (ref 11.1–15.9)
Immature Grans (Abs): 0 10*3/uL (ref 0.0–0.1)
Immature Granulocytes: 0 %
Lymphocytes Absolute: 2.2 10*3/uL (ref 0.7–3.1)
Lymphs: 35 %
MCH: 32 pg (ref 26.6–33.0)
MCHC: 33.5 g/dL (ref 31.5–35.7)
MCV: 96 fL (ref 79–97)
Monocytes Absolute: 0.8 10*3/uL (ref 0.1–0.9)
Monocytes: 13 %
Neutrophils Absolute: 2.9 10*3/uL (ref 1.4–7.0)
Neutrophils: 49 %
Platelets: 315 10*3/uL (ref 150–450)
RBC: 3.97 x10E6/uL (ref 3.77–5.28)
RDW: 12.8 % (ref 11.7–15.4)
WBC: 6.1 10*3/uL (ref 3.4–10.8)

## 2021-04-13 LAB — LIPID PANEL
Chol/HDL Ratio: 4.3 ratio (ref 0.0–4.4)
Cholesterol, Total: 226 mg/dL — ABNORMAL HIGH (ref 100–199)
HDL: 52 mg/dL (ref >39)
LDL Chol Calc (NIH): 156 mg/dL — ABNORMAL HIGH (ref 0–99)
Triglycerides: 101 mg/dL (ref 0–149)
VLDL Cholesterol Cal: 18 mg/dL (ref 5–40)

## 2021-04-13 LAB — HEMOGLOBIN A1C
Est. average glucose Bld gHb Est-mCnc: 143 mg/dL
Hgb A1c MFr Bld: 6.6 % — ABNORMAL HIGH (ref 4.8–5.6)

## 2021-04-22 ENCOUNTER — Other Ambulatory Visit: Payer: Self-pay | Admitting: Internal Medicine

## 2021-04-22 DIAGNOSIS — I10 Essential (primary) hypertension: Secondary | ICD-10-CM

## 2021-04-23 NOTE — Telephone Encounter (Signed)
Rx request 

## 2021-04-23 NOTE — Telephone Encounter (Signed)
Requested medications are due for refill today.  yes  Requested medications are on the active medications list.  yes  Last refill. 03/19/2021  Future visit scheduled.   no  Notes to clinic.  Failed protocol d/t abnormal labs.

## 2021-05-01 ENCOUNTER — Ambulatory Visit: Payer: Self-pay

## 2021-05-01 ENCOUNTER — Encounter: Payer: Self-pay | Admitting: Internal Medicine

## 2021-05-01 ENCOUNTER — Ambulatory Visit: Payer: Self-pay | Admitting: *Deleted

## 2021-05-01 ENCOUNTER — Ambulatory Visit (INDEPENDENT_AMBULATORY_CARE_PROVIDER_SITE_OTHER): Payer: Medicare Other | Admitting: Internal Medicine

## 2021-05-01 ENCOUNTER — Other Ambulatory Visit: Payer: Self-pay

## 2021-05-01 VITALS — BP 128/78 | HR 86 | Ht 64.0 in | Wt 170.0 lb

## 2021-05-01 DIAGNOSIS — M5442 Lumbago with sciatica, left side: Secondary | ICD-10-CM

## 2021-05-01 DIAGNOSIS — I639 Cerebral infarction, unspecified: Secondary | ICD-10-CM

## 2021-05-01 MED ORDER — TRAMADOL HCL 50 MG PO TABS: 50.0000 mg | ORAL_TABLET | Freq: Three times a day (TID) | ORAL | 0 refills | Status: AC | PRN

## 2021-05-01 MED ORDER — PREDNISONE 10 MG PO TABS: ORAL_TABLET | ORAL | 0 refills | Status: DC

## 2021-05-01 MED ORDER — CYCLOBENZAPRINE HCL 10 MG PO TABS: 10.0000 mg | ORAL_TABLET | Freq: Two times a day (BID) | ORAL | 0 refills | Status: DC

## 2021-05-01 NOTE — Telephone Encounter (Signed)
Patient scheduled for today at 420.

## 2021-05-01 NOTE — Patient Instructions (Signed)
No Aleve while taking the prednisone taper  Use ice on back and hip - if uncomfortable, use heat instead.  Use either intermittently - rather than continuously  Use the Tramadol mainly at night to allow some sleep.  Watch for over-sedation with the cyclobenzaprine - you may have to cut the dose in half.

## 2021-05-01 NOTE — Telephone Encounter (Signed)
This pt has been speaking with office and Dr B wanted appt made as I have touched base with them. Pt not happy has to wait to be seen tomorrow @ 10:00 am, wants to know what to do for pain in between, see was triaged, yet office states that if pt has questions should be triaged, maybe go over solutions to make more comfortable again? Wants a fu call from a nurse on this call at 12:05 9/27 states to cb at [place] patient to review back pain symptoms. Patient reports she started having pain in low back yesterday afternoon. C/o severe, constant back pain  and radiates to left leg above knee. C/o bilateral feet numbness but reports that is prior to back pain. Pain in back is constant. Patient has tried taking tylenol and aleeve today without relief. Patient attempting to use heating pad to low back . Can walk with use of walker . Denies fever, swelling in back or left leg. Denies picking up heavy objects or activity that may have triggered low back pain. Patient requesting pain medication to ease pain until she can be seen tomorrow during appt. Instructed patient if pain is severe and she is unable to manage pain until seeing PCP tomorrow go to ED now for evaluation. Patient would like to hear back from PCP. Care advise given and  to alternate using heating pad every 20 minutes and remove and use 4 times a day as needed. Patient verbalized understanding of care advise and to call back or go to ED . Patient would like to hear from PCP before going to ED.

## 2021-05-01 NOTE — Telephone Encounter (Signed)
Reason for Disposition  [1] SEVERE back pain (e.g., excruciating) AND [2] sudden onset AND [3] age > 60 years  Answer Assessment - Initial Assessment Questions 1. ONSET: "When did the pain begin?"      Yesterday afternoon 2. LOCATION: "Where does it hurt?" (upper, mid or lower back)     Low back and left leg above knee  3. SEVERITY: "How bad is the pain?"  (e.g., Scale 1-10; mild, moderate, or severe)   - MILD (1-3): doesn't interfere with normal activities    - MODERATE (4-7): interferes with normal activities or awakens from sleep    - SEVERE (8-10): excruciating pain, unable to do any normal activities      severe 4. PATTERN: "Is the pain constant?" (e.g., yes, no; constant, intermittent)      Constant  5. RADIATION: "Does the pain shoot into your legs or elsewhere?"     Radiates to left left above knee 6. CAUSE:  "What do you think is causing the back pain?"      Not sure  7. BACK OVERUSE:  "Any recent lifting of heavy objects, strenuous work or exercise?"     Denies  8. MEDICATIONS: "What have you taken so far for the pain?" (e.g., nothing, acetaminophen, NSAIDS)     Tylenol, aleve  9. NEUROLOGIC SYMPTOMS: "Do you have any weakness, numbness, or problems with bowel/bladder control?"     Numbness in bilateral feet which was present prior to back pain  10. OTHER SYMPTOMS: "Do you have any other symptoms?" (e.g., fever, abdominal pain, burning with urination, blood in urine)       Denies  11. PREGNANCY: "Is there any chance you are pregnant?" (e.g., yes, no; LMP)       na  Protocols used: Back Pain-A-AH

## 2021-05-01 NOTE — Progress Notes (Signed)
Date:  05/01/2021   Name:  Bonnie Jackson   DOB:  03-20-1941   MRN:  [id number]   Chief Complaint: Back Pain  Back Pain This is a new problem. The current episode started yesterday. The problem occurs constantly. The problem has been gradually worsening since onset. The pain is present in the lumbar spine. The quality of the pain is described as shooting and aching. The pain radiates to the left thigh. The pain is at a severity of 6/10. The pain is severe. The pain is The same all the time. The symptoms are aggravated by standing, twisting and position. Associated symptoms include leg pain. Pertinent negatives include no bladder incontinence, bowel incontinence, chest pain, fever, perianal numbness or weakness. She has tried heat, bed rest and NSAIDs for the symptoms. The treatment provided mild relief.   Lab Results  Component Value Date   CREATININE 0.78 04/12/2021   BUN 21 04/12/2021   NA 138 04/12/2021   K 5.3 (H) 04/12/2021   CL 101 04/12/2021   CO2 24 04/12/2021   Lab Results  Component Value Date   CHOL 226 (H) 04/12/2021   HDL 52 04/12/2021   LDLCALC 156 (H) 04/12/2021   TRIG 101 04/12/2021   CHOLHDL 4.3 04/12/2021   Lab Results  Component Value Date   TSH 1.940 11/25/2019   Lab Results  Component Value Date   HGBA1C 6.6 (H) 04/12/2021   Lab Results  Component Value Date   WBC 6.1 04/12/2021   HGB 12.7 04/12/2021   HCT 37.9 04/12/2021   MCV 96 04/12/2021   PLT 315 04/12/2021   Lab Results  Component Value Date   ALT 16 04/12/2021   AST 16 04/12/2021   ALKPHOS 57 04/12/2021   BILITOT 0.5 04/12/2021     Review of Systems  Constitutional:  Negative for chills, fatigue and fever.  Respiratory:  Negative for chest tightness and shortness of breath.   Cardiovascular:  Negative for chest pain and leg swelling.  Gastrointestinal:  Negative for bowel incontinence.  Genitourinary:  Negative for bladder incontinence.  Musculoskeletal:  Positive for back pain and  myalgias.  Neurological:  Negative for weakness.  Psychiatric/Behavioral:  Positive for sleep disturbance. Negative for dysphoric mood. The patient is not nervous/anxious.    Patient Active Problem List   Diagnosis Date Noted   Prediabetes 06/01/2020   Carotid artery stenosis 10/16/2018   B12 nutritional deficiency 08/20/2018   Neuropathy 06/06/2017   Hearing loss of right ear 06/06/2017   Gait instability 03/27/2017   Muscle fasciculation 03/27/2017   Gastroesophageal reflux disease 03/27/2017   OAB (overactive bladder) 05/22/2016   Cataract of right eye 01/19/2016   Eversion of the eyelid 01/19/2016   Neoplasm of uncertain behavior of skin 01/19/2016   Thalamic infarction ([place]) 11/16/2015   Aneurysm, cerebral, nonruptured 01/04/2015   Essential (primary) hypertension 01/04/2015   Mixed hyperlipidemia 01/04/2015   Idiopathic insomnia 01/04/2015   Special screening for malignant neoplasms, colon 01/04/2015    No Known Allergies  Past Surgical History:  Procedure Laterality Date   ABDOMINAL HERNIA REPAIR     CATARACT EXTRACTION W/PHACO Right 06/24/2016   Procedure: CATARACT EXTRACTION PHACO AND INTRAOCULAR LENS PLACEMENT ([place]);  Surgeon: Ronnell Freshwater, MD;  Location: [facility];  Service: Ophthalmology;  Laterality: Right;  RIGHT   CATARACT EXTRACTION W/PHACO Left 10/25/2019   Procedure: CATARACT EXTRACTION PHACO AND INTRAOCULAR LENS PLACEMENT ([facility]) LEFT 4.00  00:41.9 ;  Surgeon: Eulogio Bear, MD;  Location: [facility];  Service: Ophthalmology;  Laterality: Left;   CHOLECYSTECTOMY     IR GENERIC HISTORICAL  01/12/2016   IR ANGIO INTRA EXTRACRAN SEL INTERNAL CAROTID BILAT MOD SED 01/12/2016 Consuella Lose, MD [facility]   IR GENERIC HISTORICAL  01/12/2016   IR ANGIO VERTEBRAL SEL VERTEBRAL BILAT MOD SED 01/12/2016 Consuella Lose, MD [facility]   IR GENERIC HISTORICAL  01/12/2016   IR 3D INDEPENDENT WKST 01/12/2016 Consuella Lose, MD  [facility]   REPLACEMENT TOTAL KNEE Bilateral    TOTAL ABDOMINAL HYSTERECTOMY      Social History   Tobacco Use   Smoking status: Never   Smokeless tobacco: Never   Tobacco comments:    smoking cessation materials not required  Vaping Use   Vaping Use: Never used  Substance Use Topics   Alcohol use: Yes    Alcohol/week: 1.0 standard drink    Types: 1 Cans of beer per week    Comment:     Drug use: No     Medication list has been reviewed and updated.  Current Meds  Medication Sig   aspirin EC 325 MG tablet Take 325 mg by mouth daily.    atorvastatin (LIPITOR) 20 MG tablet Take 1 tablet (20 mg total) by mouth daily at 6 PM.   Cholecalciferol (VITAMIN D-3) 25 MCG (1000 UT) CAPS Take by mouth daily.   cyclobenzaprine (FLEXERIL) 10 MG tablet Take 1 tablet (10 mg total) by mouth in the morning and at bedtime.   hydrochlorothiazide (HYDRODIURIL) 25 MG tablet TAKE 1/2 TABLET BY MOUTH EVERY DAY   latanoprost (XALATAN) 0.005 % ophthalmic solution 1 drop at bedtime.   lisinopril-hydrochlorothiazide (ZESTORETIC) 10-12.5 MG tablet TAKE 1 TABLET BY MOUTH EVERY DAY   predniSONE (DELTASONE) 10 MG tablet Take 6 on day 1and 2, 5 on day 3 and 4, 4 on day 5 and 6 , 3 on day 7 and 8, 2 on day 9 and 10 and 1 on day 11 and 12 then stop.   traMADol (ULTRAM) 50 MG tablet Take 1 tablet (50 mg total) by mouth every 8 (eight) hours as needed for up to 5 days.    PHQ 2/9 Scores 05/01/2021 03/29/2021 09/28/2020 06/01/2020  PHQ - 2 Score 0 0 6 0  PHQ- 9 Score 1 3 9 1     GAD 7 : Generalized Anxiety Score 05/01/2021 03/29/2021 09/28/2020 06/01/2020  Nervous, Anxious, on Edge 0 0 2 0  Control/stop worrying 0 0 2 0  Worry too much - different things 0 0 2 0  Trouble relaxing 0 0 3 0  Restless 0 0 3 0  Easily annoyed or irritable 0 0 0 0  Afraid - awful might happen 0 0 0 0  Total GAD 7 Score 0 0 12 0  Anxiety Difficulty Not difficult at all - Not difficult at all Not difficult at all    BP Readings  from Last 3 Encounters:  05/01/21 128/78  03/29/21 124/62  09/28/20 (!) 120/58    Physical Exam Vitals and nursing note reviewed.  Constitutional:      General: She is in acute distress.     Appearance: She is well-developed.  HENT:     Head: Normocephalic and atraumatic.  Cardiovascular:     Rate and Rhythm: Normal rate and regular rhythm.     Pulses: Normal pulses.  Pulmonary:     Effort: Pulmonary effort is normal. No respiratory distress.     Breath sounds: No wheezing  or rhonchi.  Musculoskeletal:     Cervical back: Normal range of motion.     Lumbar back: Spasms and tenderness present. Positive left straight leg raise test. Negative right straight leg raise test.     Right lower leg: No edema.     Left lower leg: No edema.  Lymphadenopathy:     Cervical: No cervical adenopathy.  Skin:    General: Skin is warm and dry.     Findings: No rash.  Neurological:     Mental Status: She is alert and oriented to person, place, and time.     Sensory: Sensation is intact.     Motor: Motor function is intact.     Deep Tendon Reflexes:     Reflex Scores:      Patellar reflexes are 1+ on the right side and 1+ on the left side.    Comments: Chronic left foot numbness  Psychiatric:        Mood and Affect: Mood normal.        Behavior: Behavior normal.    Wt Readings from Last 3 Encounters:  05/01/21 170 lb (77.1 kg)  03/29/21 170 lb (77.1 kg)  09/28/20 183 lb (83 kg)    BP 128/78   Pulse 86   Ht 5\' 4"  (1.626 m)   Wt 170 lb (77.1 kg)   SpO2 98%   BMI 29.18 kg/m   Assessment and Plan: 1. Acute left-sided low back pain with left-sided sciatica Continue to use Heat or Ice Avoid lifting and twisting No indication at this time for imaging - no red flag symptoms - predniSONE (DELTASONE) 10 MG tablet; Take 6 on day 1and 2, 5 on day 3 and 4, 4 on day 5 and 6 , 3 on day 7 and 8, 2 on day 9 and 10 and 1 on day 11 and 12 then stop.  Dispense: 42 tablet; Refill: 0 -  cyclobenzaprine (FLEXERIL) 10 MG tablet; Take 1 tablet (10 mg total) by mouth in the morning and at bedtime.  Dispense: 30 tablet; Refill: 0 - traMADol (ULTRAM) 50 MG tablet; Take 1 tablet (50 mg total) by mouth every 8 (eight) hours as needed for up to 5 days.  Dispense: 15 tablet; Refill: 0   Partially dictated using Editor, commissioning. Any errors are unintentional.  Halina Maidens, MD [facility]  05/01/2021

## 2021-05-01 NOTE — Telephone Encounter (Signed)
Pt c/o new onset lower back pain that radiates down leg to left knee. Pt stated pain is severe and has tried Aleve and heating pad with no effect. Pt stated pain is constant. Pt denies weakness or numbness. Pt tried acetaminophen too without effect. Care advice given and pt verbalized understanding. Called office and discussed pt with Estill Bamberg and recommended disposition (UC or PCP triage). Was advised by Estill Bamberg to send note to office and they will discuss appt and call pt.      Reason for Disposition  [1] Pain radiates into the thigh or further down the leg AND [2] both legs  Answer Assessment - Initial Assessment Questions 1. ONSET: "When did the pain begin?"      yesterday 2. LOCATION: "Where does it hurt?" (upper, mid or lower back)     Lower back- left leg to above knee 3. SEVERITY: "How bad is the pain?"  (e.g., Scale 1-10; mild, moderate, or severe)   - MILD (1-3): doesn't interfere with normal activities    - MODERATE (4-7): interferes with normal activities or awakens from sleep    - SEVERE (8-10): excruciating pain, unable to do any normal activities      severe 4. PATTERN: "Is the pain constant?" (e.g., yes, no; constant, intermittent)      constant 5. RADIATION: "Does the pain shoot into your legs or elsewhere?"     Left leg to the knee 6. CAUSE:  "What do you think is causing the back pain?"      No known cause 7. BACK OVERUSE:  "Any recent lifting of heavy objects, strenuous work or exercise?"     no 8. MEDICATIONS: "What have you taken so far for the pain?" (e.g., nothing, acetaminophen, NSAIDS)     Naproxen last dose was taken 1000 took 2 pills 9. NEUROLOGIC SYMPTOMS: "Do you have any weakness, numbness, or problems with bowel/bladder control?"     no 10. OTHER SYMPTOMS: "Do you have any other symptoms?" (e.g., fever, abdominal pain, burning with urination, blood in urine)       no 11. PREGNANCY: "Is there any chance you are pregnant?" (e.g., yes, no; LMP)        N/a  Protocols used: Back Pain-A-AH

## 2021-05-02 ENCOUNTER — Ambulatory Visit: Payer: Medicare Other | Admitting: Internal Medicine

## 2021-05-09 DIAGNOSIS — M47896 Other spondylosis, lumbar region: Secondary | ICD-10-CM | POA: Diagnosis not present

## 2021-05-09 DIAGNOSIS — M5416 Radiculopathy, lumbar region: Secondary | ICD-10-CM | POA: Diagnosis not present

## 2021-05-09 DIAGNOSIS — S39012A Strain of muscle, fascia and tendon of lower back, initial encounter: Secondary | ICD-10-CM | POA: Diagnosis not present

## 2021-05-09 DIAGNOSIS — M4316 Spondylolisthesis, lumbar region: Secondary | ICD-10-CM | POA: Diagnosis not present

## 2021-05-09 DIAGNOSIS — M545 Low back pain, unspecified: Secondary | ICD-10-CM | POA: Diagnosis not present

## 2021-05-10 ENCOUNTER — Other Ambulatory Visit: Payer: Self-pay | Admitting: Internal Medicine

## 2021-05-10 DIAGNOSIS — M5442 Lumbago with sciatica, left side: Secondary | ICD-10-CM

## 2021-05-11 ENCOUNTER — Other Ambulatory Visit: Payer: Self-pay | Admitting: Internal Medicine

## 2021-05-11 DIAGNOSIS — I6381 Other cerebral infarction due to occlusion or stenosis of small artery: Secondary | ICD-10-CM

## 2021-05-11 DIAGNOSIS — E782 Mixed hyperlipidemia: Secondary | ICD-10-CM

## 2021-05-11 NOTE — Telephone Encounter (Signed)
Requested medication (s) are due for refill today - no  Requested medication (s) are on the active medication list-yes  Future visit scheduled -no  Last refill: 05/01/21 #30  Notes to clinic: Request Rx: non delegated Rx  Requested Prescriptions  Pending Prescriptions Disp Refills   cyclobenzaprine (FLEXERIL) 10 MG tablet [Pharmacy Med Name: CYCLOBENZAPRINE 10 MG TABLET] 30 tablet 0    Sig: Take 1 tablet (10 mg total) by mouth in the morning and at bedtime.     Not Delegated - Analgesics:  Muscle Relaxants Failed - 05/10/2021  5:08 PM      Failed - This refill cannot be delegated      Passed - Valid encounter within last 6 months    Recent Outpatient Visits           1 week ago Acute left-sided low back pain with left-sided sciatica   [facility] Glean Hess, MD   1 month ago Essential (primary) hypertension   [facility] Glean Hess, MD   7 months ago Encounter for screening mammogram for breast cancer   [facility] Glean Hess, MD   11 months ago Prediabetes   [facility] Glean Hess, MD   1 year ago Essential (primary) hypertension   [place], [place]                 Requested Prescriptions  Pending Prescriptions Disp Refills   cyclobenzaprine (FLEXERIL) 10 MG tablet [Pharmacy Med Name: CYCLOBENZAPRINE 10 MG TABLET] 30 tablet 0    Sig: Take 1 tablet (10 mg total) by mouth in the morning and at bedtime.     Not Delegated - Analgesics:  Muscle Relaxants Failed - 05/10/2021  5:08 PM      Failed - This refill cannot be delegated      Passed - Valid encounter within last 6 months    Recent Outpatient Visits           1 week ago Acute left-sided low back pain with left-sided sciatica   [facility] Glean Hess, MD   1 month ago Essential (primary) hypertension   [facility] Glean Hess, MD   7 months ago Encounter for screening mammogram for  breast cancer   [facility] Glean Hess, MD   11 months ago Prediabetes   [facility] Glean Hess, MD   1 year ago Essential (primary) hypertension   [facility] Glean Hess, MD

## 2021-05-11 NOTE — Telephone Encounter (Signed)
Requested Prescriptions  Pending Prescriptions Disp Refills  . atorvastatin (LIPITOR) 20 MG tablet [Pharmacy Med Name: ATORVASTATIN 20 MG TABLET] 90 tablet 1    Sig: TAKE 1 TABLET BY MOUTH DAILY AT 6 PM.     Cardiovascular:  Antilipid - Statins Failed - 05/11/2021  2:13 AM      Failed - Total Cholesterol in normal range and within 360 days    Cholesterol, Total  Date Value Ref Range Status  04/12/2021 226 (H) 100 - 199 mg/dL Final         Failed - LDL in normal range and within 360 days    LDL Chol Calc (NIH)  Date Value Ref Range Status  04/12/2021 156 (H) 0 - 99 mg/dL Final         Passed - HDL in normal range and within 360 days    HDL  Date Value Ref Range Status  04/12/2021 52 >39 mg/dL Final         Passed - Triglycerides in normal range and within 360 days    Triglycerides  Date Value Ref Range Status  04/12/2021 101 0 - 149 mg/dL Final         Passed - Patient is not pregnant      Passed - Valid encounter within last 12 months    Recent Outpatient Visits          1 week ago Acute left-sided low back pain with left-sided sciatica   [facility] Glean Hess, MD   1 month ago Essential (primary) hypertension   [facility] Glean Hess, MD   7 months ago Encounter for screening mammogram for breast cancer   [facility] Glean Hess, MD   11 months ago Prediabetes   [facility] Glean Hess, MD   1 year ago Essential (primary) hypertension   [facility] Glean Hess, MD

## 2021-05-13 ENCOUNTER — Emergency Department
Admission: EM | Admit: 2021-05-13 | Discharge: 2021-05-13 | Disposition: A | Discharge status: Home / Self Care | Payer: Medicare Other | Attending: Emergency Medicine | Admitting: Emergency Medicine

## 2021-05-13 ENCOUNTER — Encounter: Payer: Self-pay | Admitting: Internal Medicine

## 2021-05-13 ENCOUNTER — Other Ambulatory Visit: Payer: Self-pay

## 2021-05-13 DIAGNOSIS — I10 Essential (primary) hypertension: Secondary | ICD-10-CM | POA: Diagnosis not present

## 2021-05-13 DIAGNOSIS — M545 Low back pain, unspecified: Secondary | ICD-10-CM | POA: Diagnosis not present

## 2021-05-13 DIAGNOSIS — G8929 Other chronic pain: Secondary | ICD-10-CM | POA: Diagnosis not present

## 2021-05-13 DIAGNOSIS — D72829 Elevated white blood cell count, unspecified: Secondary | ICD-10-CM | POA: Insufficient documentation

## 2021-05-13 DIAGNOSIS — R52 Pain, unspecified: Secondary | ICD-10-CM | POA: Diagnosis not present

## 2021-05-13 DIAGNOSIS — M5442 Lumbago with sciatica, left side: Secondary | ICD-10-CM | POA: Insufficient documentation

## 2021-05-13 DIAGNOSIS — Z79899 Other long term (current) drug therapy: Secondary | ICD-10-CM | POA: Insufficient documentation

## 2021-05-13 DIAGNOSIS — Z96653 Presence of artificial knee joint, bilateral: Secondary | ICD-10-CM | POA: Diagnosis not present

## 2021-05-13 DIAGNOSIS — Z85828 Personal history of other malignant neoplasm of skin: Secondary | ICD-10-CM | POA: Diagnosis not present

## 2021-05-13 DIAGNOSIS — Z7982 Long term (current) use of aspirin: Secondary | ICD-10-CM | POA: Diagnosis not present

## 2021-05-13 DIAGNOSIS — M549 Dorsalgia, unspecified: Secondary | ICD-10-CM | POA: Diagnosis not present

## 2021-05-13 LAB — BASIC METABOLIC PANEL
Anion gap: 11 (ref 5–15)
BUN: 22 mg/dL (ref 8–23)
CO2: 28 mmol/L (ref 22–32)
Calcium: 10.4 mg/dL — ABNORMAL HIGH (ref 8.9–10.3)
Chloride: 94 mmol/L — ABNORMAL LOW (ref 98–111)
Creatinine, Ser: 0.69 mg/dL (ref 0.44–1.00)
GFR, Estimated: >60 mL/min (ref >60)
Glucose, Bld: 155 mg/dL — ABNORMAL HIGH (ref 70–99)
Potassium: 3.1 mmol/L — ABNORMAL LOW (ref 3.5–5.1)
Sodium: 133 mmol/L — ABNORMAL LOW (ref 135–145)

## 2021-05-13 LAB — CBC WITH DIFFERENTIAL/PLATELET
Abs Immature Granulocytes: 0.1 10*3/uL — ABNORMAL HIGH (ref 0.00–0.07)
Basophils Absolute: 0.1 10*3/uL (ref 0.0–0.1)
Basophils Relative: 1 %
Eosinophils Absolute: 0.1 10*3/uL (ref 0.0–0.5)
Eosinophils Relative: 1 %
HCT: 42.7 % (ref 36.0–46.0)
Hemoglobin: 15.2 g/dL — ABNORMAL HIGH (ref 12.0–15.0)
Immature Granulocytes: 1 %
Lymphocytes Relative: 15 %
Lymphs Abs: 1.9 10*3/uL (ref 0.7–4.0)
MCH: 32.5 pg (ref 26.0–34.0)
MCHC: 35.6 g/dL (ref 30.0–36.0)
MCV: 91.4 fL (ref 80.0–100.0)
Monocytes Absolute: 1.2 10*3/uL — ABNORMAL HIGH (ref 0.1–1.0)
Monocytes Relative: 9 %
Neutro Abs: 9.5 10*3/uL — ABNORMAL HIGH (ref 1.7–7.7)
Neutrophils Relative %: 73 %
Platelets: 311 10*3/uL (ref 150–400)
RBC: 4.67 MIL/uL (ref 3.87–5.11)
RDW: 13 % (ref 11.5–15.5)
WBC: 12.8 10*3/uL — ABNORMAL HIGH (ref 4.0–10.5)
nRBC: 0 % (ref 0.0–0.2)

## 2021-05-13 MED ORDER — OXYCODONE-ACETAMINOPHEN 5-325 MG PO TABS: 1.0000 | ORAL_TABLET | Freq: Four times a day (QID) | ORAL | 0 refills | Status: DC | PRN

## 2021-05-13 MED ORDER — LIDOCAINE 5 % EX PTCH: 1.0000 | MEDICATED_PATCH | Freq: Two times a day (BID) | CUTANEOUS | 0 refills | Status: DC

## 2021-05-13 MED ORDER — MORPHINE SULFATE (PF) 4 MG/ML IV SOLN
4.0000 mg | Freq: Once | INTRAVENOUS | Status: AC
  Administered 2021-05-13: 4 mg via INTRAVENOUS
  Filled 2021-05-13: qty 1

## 2021-05-13 MED ORDER — POTASSIUM CHLORIDE CRYS ER 20 MEQ PO TBCR
40.0000 meq | EXTENDED_RELEASE_TABLET | Freq: Once | ORAL | Status: AC
  Administered 2021-05-13: 40 meq via ORAL
  Filled 2021-05-13: qty 2

## 2021-05-13 MED ORDER — PREDNISONE 10 MG PO TABS: ORAL_TABLET | ORAL | 0 refills | Status: DC

## 2021-05-13 MED ORDER — LIDOCAINE 5 % EX PTCH
1.0000 | MEDICATED_PATCH | CUTANEOUS | Status: DC
  Administered 2021-05-13: 1 via TRANSDERMAL
  Filled 2021-05-13: qty 1

## 2021-05-13 MED ORDER — PANTOPRAZOLE SODIUM 40 MG PO TBEC
40.0000 mg | DELAYED_RELEASE_TABLET | ORAL | Status: AC
  Administered 2021-05-13: 40 mg via ORAL
  Filled 2021-05-13: qty 1

## 2021-05-13 MED ORDER — HYDROCODONE-ACETAMINOPHEN 5-325 MG PO TABS
2.0000 | ORAL_TABLET | Freq: Once | ORAL | Status: AC
  Administered 2021-05-13: 2 via ORAL
  Filled 2021-05-13: qty 2

## 2021-05-13 MED ORDER — OMEPRAZOLE MAGNESIUM 20 MG PO TBEC: 20.0000 mg | DELAYED_RELEASE_TABLET | Freq: Every day | ORAL | 1 refills | Status: DC

## 2021-05-13 MED ORDER — KETOROLAC TROMETHAMINE 30 MG/ML IJ SOLN
15.0000 mg | Freq: Once | INTRAMUSCULAR | Status: AC
  Administered 2021-05-13: 15 mg via INTRAVENOUS
  Filled 2021-05-13: qty 1

## 2021-05-13 NOTE — ED Triage Notes (Signed)
Pt presents via [facility] reporting 10/10 (L) lower back sciatic back pain. Tramadol prescribed from [facility], reports she doesn't take it 2/2 lightheadedness/side effects. CBG with EMS 180. Vitals WNL per EMS, NSR.

## 2021-05-13 NOTE — Discharge Instructions (Addendum)
You were evaluated in the Emergency Department today for back pain.  Although you have ongoing sciatica, it does not appear you need emergent surgery at this time.  We recommend that you follow-up with your orthopedic doctor as scheduled for your outpatient MRI in 2 days.  If those plans do not work out, we strongly recommend you call Dr. Izora Ribas at the number provided; you saw him before for an aneurysm and he also may be able to help with evaluation of your back pain and sciatica.  - Move around as tolerated but avoiding heavy lifting. "Bed rest" is not recommended nor is it the best treatment for low back pain.  - Medications will help control your discomfort: -- Ibuprofen (600 mg every 8 hours for pain with meals). -- Any other prescriptions you were provided as per label instructions -- Do not drink alcohol, drive a car, operate machinery, or get up on ladders or heights when taking any prescribed pain medications. -- Do not drive home if you received prescribed pain medications here in the ED.  We strongly recommend that you take a medication such as Prilosec, Nexium, pantoprazole, or other PPI (proton pump inhibitor).  This helps protect your stomach while you are taking prednisone and an NSAID such as ibuprofen.  Please follow up with your primary care physician as needed or any other providers listed in this paperwork. If you do not have a primary doctor, you can call your insurance company to find one.  If you do not have insurance, you can go to the finance/registration department for more assistance.  Return to the ED immediately if you develop any of the following problems: -- Leaking urine or difficulty urinating; -- Inability to control your bowels; -- New numbness or weakness in your legs or numbness between your legs; -- Inability to walk -- Fever

## 2021-05-13 NOTE — ED Provider Notes (Signed)
[facility] Emergency Department Provider Note  ____________________________________________   Event Date/Time   First MD Initiated Contact with Patient 05/13/21 0542     (approximate)  I have reviewed the triage vital signs and the nursing notes.   HISTORY  Chief Complaint Back Pain    HPI Bonnie Jackson is a 80 y.o. female with medical history as listed below.  She presents tonight by EMS for evaluation of acute on chronic left-sided low back pain with sciatica.  She says she has been evaluated for sciatica by [facility] and is scheduled to get an outpatient MRI in 2 days, but the back pain was too severe tonight and she could not sleep.  She sometimes takes tramadol but says she has not been very compliant with it because it makes her feel lightheaded.  She completed a prednisone taper yesterday and it may or may not have helped a little bit.  She said the pain is severe and worse when she moves her leg or bears weight.  She has had no urinary dysfunction including no urinary incontinence, urinary retention, dysuria, and she has had no bowel dysfunction and is able to have bowel movements.  She has no new weakness or numbness.  She has had no recent trauma.  She denies fever/chills, sore throat, chest pain, shortness of breath, nausea, vomiting, and abdominal pain.  The pain is in the lower part of her back on the left side and radiates down the side of her left leg.     Past Medical History:  Diagnosis Date   Brain aneurysm    Cataract    right   CVA (cerebral infarction)    no deficits   Hypertension     Patient Active Problem List   Diagnosis Date Noted   Prediabetes 06/01/2020   Carotid artery stenosis 10/16/2018   B12 nutritional deficiency 08/20/2018   Neuropathy 06/06/2017   Hearing loss of right ear 06/06/2017   Gait instability 03/27/2017   Muscle fasciculation 03/27/2017   Gastroesophageal reflux disease 03/27/2017   OAB (overactive  bladder) 05/22/2016   Cataract of right eye 01/19/2016   Eversion of the eyelid 01/19/2016   Neoplasm of uncertain behavior of skin 01/19/2016   Thalamic infarction ([place]) 11/16/2015   Aneurysm, cerebral, nonruptured 01/04/2015   Essential (primary) hypertension 01/04/2015   Mixed hyperlipidemia 01/04/2015   Idiopathic insomnia 01/04/2015   Special screening for malignant neoplasms, colon 01/04/2015    Past Surgical History:  Procedure Laterality Date   ABDOMINAL HERNIA REPAIR     CATARACT EXTRACTION W/PHACO Right 06/24/2016   Procedure: CATARACT EXTRACTION PHACO AND INTRAOCULAR LENS PLACEMENT ([place]);  Surgeon: Ronnell Freshwater, MD;  Location: [place];  Service: Ophthalmology;  Laterality: Right;  RIGHT   CATARACT EXTRACTION W/PHACO Left 10/25/2019   Procedure: CATARACT EXTRACTION PHACO AND INTRAOCULAR LENS PLACEMENT ([facility]) LEFT 4.00  00:41.9 ;  Surgeon: Eulogio Bear, MD;  Location: [facility];  Service: Ophthalmology;  Laterality: Left;   CHOLECYSTECTOMY     IR GENERIC HISTORICAL  01/12/2016   IR ANGIO INTRA EXTRACRAN SEL INTERNAL CAROTID BILAT MOD SED 01/12/2016 Consuella Lose, MD [facility]   IR GENERIC HISTORICAL  01/12/2016   IR ANGIO VERTEBRAL SEL VERTEBRAL BILAT MOD SED 01/12/2016 Consuella Lose, MD [facility]   IR GENERIC HISTORICAL  01/12/2016   IR 3D INDEPENDENT WKST 01/12/2016 Consuella Lose, MD [facility]   REPLACEMENT TOTAL KNEE Bilateral    TOTAL ABDOMINAL HYSTERECTOMY  Prior to Admission medications   Medication Sig Start Date End Date Taking? Authorizing Provider  lidocaine (LIDODERM) 5 % Place 1 patch onto the skin every 12 (twelve) hours. Remove & Discard patch within 12 hours or as directed by MD 05/13/21 05/13/22 Yes Hinda Kehr, MD  omeprazole (PRILOSEC OTC) 20 MG tablet Take 1 tablet (20 mg total) by mouth daily. 05/13/21 05/13/22 Yes Hinda Kehr, MD  oxyCODONE-acetaminophen (PERCOCET) 5-325 MG tablet Take 1 tablet by  mouth every 6 (six) hours as needed for severe pain. 05/13/21  Yes Hinda Kehr, MD  predniSONE (DELTASONE) 10 MG tablet Take 4 tabs (40 mg) PO x 3 days, then take 2 tabs (20 mg) PO x 3 days, then take 1 tab (10 mg) PO x 3 days, then take 1/2 tab (5 mg) PO x 4 days. 05/13/21  Yes Hinda Kehr, MD  aspirin EC 325 MG tablet Take 325 mg by mouth daily.     [provider]  atorvastatin (LIPITOR) 20 MG tablet TAKE 1 TABLET BY MOUTH DAILY AT 6 PM. 05/11/21   Glean Hess, MD  Cholecalciferol (VITAMIN D-3) 25 MCG (1000 UT) CAPS Take by mouth daily.    [provider]  cyclobenzaprine (FLEXERIL) 10 MG tablet Take 1 tablet (10 mg total) by mouth in the morning and at bedtime. 05/01/21   Glean Hess, MD  hydrochlorothiazide (HYDRODIURIL) 25 MG tablet TAKE 1/2 TABLET BY MOUTH EVERY DAY 11/29/20   Glean Hess, MD  latanoprost (XALATAN) 0.005 % ophthalmic solution 1 drop at bedtime.    [provider]  lisinopril-hydrochlorothiazide (ZESTORETIC) 10-12.5 MG tablet TAKE 1 TABLET BY MOUTH EVERY DAY 04/23/21   Glean Hess, MD    Allergies Patient has no known allergies.  Family History  Problem Relation Age of Onset   Breast cancer Mother 3   Heart attack Father    Dementia Sister    Cancer Brother        lung   Cancer Sister        pancreatic   Cancer Brother        lung   Breast cancer Daughter 72    Social History Social History   Tobacco Use   Smoking status: Never   Smokeless tobacco: Never   Tobacco comments:    smoking cessation materials not required  Vaping Use   Vaping Use: Never used  Substance Use Topics   Alcohol use: Yes    Alcohol/week: 1.0 standard drink    Types: 1 Cans of beer per week    Comment:     Drug use: No    Review of Systems Constitutional: No fever/chills Eyes: No visual changes. ENT: No sore throat. Cardiovascular: Denies chest pain. Respiratory: Denies shortness of breath. Gastrointestinal: No abdominal  pain.  No nausea, no vomiting.  No diarrhea.  No constipation. Genitourinary: Negative for dysuria. Musculoskeletal: Low back pain with sciatica. Integumentary: Negative for rash. Neurological: Negative for headaches, focal weakness or numbness.   ____________________________________________   PHYSICAL EXAM:  VITAL SIGNS: ED Triage Vitals  Enc Vitals Group     BP 05/13/21 0540 (!) 164/77     Pulse Rate 05/13/21 0540 81     Resp 05/13/21 0540 18     Temp 05/13/21 0542 97.8 F (36.6 C)     Temp Source 05/13/21 0542 Oral     SpO2 05/13/21 0540 100 %     Weight 05/13/21 0541 81.6 kg (180 lb)     Height  05/13/21 0541 1.626 m (5\' 4" )     Head Circumference --      Peak Flow --      Pain Score 05/13/21 0539 10     Pain Loc --      Pain Edu? --      Excl. in [place]? --     Constitutional: Alert and oriented.  Eyes: Conjunctivae are normal.  Head: Atraumatic. Nose: No congestion/rhinnorhea. Mouth/Throat: Patient is wearing a mask. Neck: No stridor.  No meningeal signs.   Cardiovascular: Normal rate, regular rhythm. Good peripheral circulation. Respiratory: Normal respiratory effort.  No retractions. Gastrointestinal: Soft and nontender. No distention.  Musculoskeletal: Patient has pain with straight leg raise of the left leg and some tenderness to palpation of the paraspinal muscles in the lower left lumbar region but no tenderness palpation of the midline. Neurologic:  Normal speech and language. No gross focal neurologic deficits are appreciated.  Skin:  Skin is warm, dry and intact. sychiatric: Mood and affect are normal. Speech and behavior are normal.  ____________________________________________   LABS (all labs ordered are listed, but only abnormal results are displayed)  Labs Reviewed  BASIC METABOLIC PANEL - Abnormal; Notable for the following components:      Result Value   Sodium 133 (*)    Potassium 3.1 (*)    Chloride 94 (*)    Glucose, Bld 155 (*)    Calcium  10.4 (*)    All other components within normal limits  CBC WITH DIFFERENTIAL/PLATELET - Abnormal; Notable for the following components:   WBC 12.8 (*)    Hemoglobin 15.2 (*)    Neutro Abs 9.5 (*)    Monocytes Absolute 1.2 (*)    Abs Immature Granulocytes 0.10 (*)    All other components within normal limits   ____________________________________________   INITIAL IMPRESSION / MDM / ASSESSMENT AND PLAN / ED COURSE  As part of my medical decision making, I reviewed the following data within the [facility] notes reviewed and incorporated, Labs reviewed , Old chart reviewed, and [place] Controlled Substance Database   Differential diagnosis includes, but is not limited to, sciatica, cauda equina syndrome, osteomyelitis/discitis, transverse myelitis.  Patient is generally well-appearing but does have discomfort due to the left-sided sciatica.  She feels it is just like usual, just worse than usual.  No recent trauma.  No indication for additional imaging.  She had x-rays recently at [facility].  The tramadol has not been working for her and she has not been taking it consistently.  I will check basic lab work and will treat with Toradol 15 mg IV and morphine 4 mg IV as well as Zofran 4 mg IV to help prevent nausea related to the morphine.  I told her her exam is generally reassuring and she should be appropriate for discharge and follow-up with orthopedics as scheduled in 2 days and she understands, she just wants some relief right now.       Clinical Course as of 05/13/21 0805  Nancy Fetter May 13, 2021  0619 CBC with Differential/Platelet(!) Although the patient has mild leukocytosis, she has also been on steroids recently.  Basic metabolic panel is notable for some mild hypokalemia for which I ordered 40 mill equivalents by mouth.  Otherwise her basic metabolic panel is reassuring. [CF]  [phone] Patient reports that she is feeling "MUCH" better and that she currently has no  pain. [CF]  [id number] I discussed additional sciatica treatment with the patient and she  will follow-up with her orthopedic doctor as scheduled but I also provided the information for Dr. Izora Ribas; she saw him before for an aneurysm and will be a good contact person if she has persistent difficulties with her back pain.  There is no indication that she has an emergent cause of back pain such as cauda equina syndrome and discitis/osteomyelitis is very unlikely.  She has an outpatient MRI scheduled in 2 more days and I encouraged her to keep that appointment.  She understands and agrees with the plan and I gave my usual and customary return precautions. [CF]    Clinical Course User Index [CF] Hinda Kehr, MD     ____________________________________________  FINAL CLINICAL IMPRESSION(S) / ED DIAGNOSES  Final diagnoses:  Chronic left-sided low back pain with left-sided sciatica     MEDICATIONS GIVEN DURING THIS VISIT:  Medications  lidocaine (LIDODERM) 5 % 1 patch (1 patch Transdermal Patch Applied 05/13/21 0728)  ketorolac (TORADOL) 30 MG/ML injection 15 mg (15 mg Intravenous Given 05/13/21 0549)  morphine 4 MG/ML injection 4 mg (4 mg Intravenous Given 05/13/21 0549)  potassium chloride SA (KLOR-CON) CR tablet 40 mEq (40 mEq Oral Given 05/13/21 0639)  HYDROcodone-acetaminophen (NORCO/VICODIN) 5-325 MG per tablet 2 tablet (2 tablets Oral Given 05/13/21 0728)  pantoprazole (PROTONIX) EC tablet 40 mg (40 mg Oral Given 05/13/21 0728)     ED Discharge Orders          Ordered    oxyCODONE-acetaminophen (PERCOCET) 5-325 MG tablet  Every 6 hours PRN        05/13/21 0650    predniSONE (DELTASONE) 10 MG tablet        05/13/21 0650    omeprazole (PRILOSEC OTC) 20 MG tablet  Daily        05/13/21 0650    lidocaine (LIDODERM) 5 %  Every 12 hours        05/13/21 0650             Note:  This document was prepared using Dragon voice recognition software and may include unintentional dictation  errors.   Hinda Kehr, MD 05/13/21 [phone]

## 2021-05-15 ENCOUNTER — Telehealth: Payer: Self-pay | Admitting: Internal Medicine

## 2021-05-15 DIAGNOSIS — M4306 Spondylolysis, lumbar region: Secondary | ICD-10-CM | POA: Diagnosis not present

## 2021-05-15 DIAGNOSIS — M545 Low back pain, unspecified: Secondary | ICD-10-CM | POA: Diagnosis not present

## 2021-05-15 DIAGNOSIS — M4826 Kissing spine, lumbar region: Secondary | ICD-10-CM | POA: Diagnosis not present

## 2021-05-15 DIAGNOSIS — M5116 Intervertebral disc disorders with radiculopathy, lumbar region: Secondary | ICD-10-CM | POA: Diagnosis not present

## 2021-05-15 DIAGNOSIS — M48061 Spinal stenosis, lumbar region without neurogenic claudication: Secondary | ICD-10-CM | POA: Diagnosis not present

## 2021-05-15 DIAGNOSIS — M4726 Other spondylosis with radiculopathy, lumbar region: Secondary | ICD-10-CM | POA: Diagnosis not present

## 2021-05-15 DIAGNOSIS — M5117 Intervertebral disc disorders with radiculopathy, lumbosacral region: Secondary | ICD-10-CM | POA: Diagnosis not present

## 2021-05-15 NOTE — Telephone Encounter (Signed)
Pt called and is requesting to have a bedside commode and wheelchair. She states that she is having trouble getting to the restroom and having trouble walking. She states that she contacted [employer] supplies. Please advise.

## 2021-05-15 NOTE — Telephone Encounter (Signed)
Sent patient a my chart message informing she will need to get medical supplies from Neurosurgery when they see her.

## 2021-05-16 ENCOUNTER — Encounter: Payer: Self-pay | Admitting: Internal Medicine

## 2021-05-16 ENCOUNTER — Other Ambulatory Visit: Payer: Self-pay

## 2021-05-16 DIAGNOSIS — M5442 Lumbago with sciatica, left side: Secondary | ICD-10-CM

## 2021-05-24 DIAGNOSIS — M25562 Pain in left knee: Secondary | ICD-10-CM | POA: Diagnosis not present

## 2021-05-24 DIAGNOSIS — R262 Difficulty in walking, not elsewhere classified: Secondary | ICD-10-CM | POA: Diagnosis not present

## 2021-05-24 DIAGNOSIS — M25552 Pain in left hip: Secondary | ICD-10-CM | POA: Diagnosis not present

## 2021-05-24 DIAGNOSIS — M5416 Radiculopathy, lumbar region: Secondary | ICD-10-CM | POA: Diagnosis not present

## 2021-05-24 DIAGNOSIS — M5442 Lumbago with sciatica, left side: Secondary | ICD-10-CM | POA: Diagnosis not present

## 2021-05-25 DIAGNOSIS — M5459 Other low back pain: Secondary | ICD-10-CM | POA: Diagnosis not present

## 2021-05-30 DIAGNOSIS — M25562 Pain in left knee: Secondary | ICD-10-CM | POA: Diagnosis not present

## 2021-05-30 DIAGNOSIS — R262 Difficulty in walking, not elsewhere classified: Secondary | ICD-10-CM | POA: Diagnosis not present

## 2021-05-30 DIAGNOSIS — M5416 Radiculopathy, lumbar region: Secondary | ICD-10-CM | POA: Diagnosis not present

## 2021-05-30 DIAGNOSIS — M5442 Lumbago with sciatica, left side: Secondary | ICD-10-CM | POA: Diagnosis not present

## 2021-05-30 DIAGNOSIS — M25552 Pain in left hip: Secondary | ICD-10-CM | POA: Diagnosis not present

## 2021-05-30 DIAGNOSIS — Z23 Encounter for immunization: Secondary | ICD-10-CM | POA: Diagnosis not present

## 2021-06-01 DIAGNOSIS — M5442 Lumbago with sciatica, left side: Secondary | ICD-10-CM | POA: Diagnosis not present

## 2021-06-01 DIAGNOSIS — M5416 Radiculopathy, lumbar region: Secondary | ICD-10-CM | POA: Diagnosis not present

## 2021-06-01 DIAGNOSIS — M25552 Pain in left hip: Secondary | ICD-10-CM | POA: Diagnosis not present

## 2021-06-01 DIAGNOSIS — R262 Difficulty in walking, not elsewhere classified: Secondary | ICD-10-CM | POA: Diagnosis not present

## 2021-06-01 DIAGNOSIS — M25562 Pain in left knee: Secondary | ICD-10-CM | POA: Diagnosis not present

## 2021-06-05 DIAGNOSIS — M25562 Pain in left knee: Secondary | ICD-10-CM | POA: Diagnosis not present

## 2021-06-05 DIAGNOSIS — R262 Difficulty in walking, not elsewhere classified: Secondary | ICD-10-CM | POA: Diagnosis not present

## 2021-06-05 DIAGNOSIS — M5416 Radiculopathy, lumbar region: Secondary | ICD-10-CM | POA: Diagnosis not present

## 2021-06-05 DIAGNOSIS — M5442 Lumbago with sciatica, left side: Secondary | ICD-10-CM | POA: Diagnosis not present

## 2021-06-05 DIAGNOSIS — M25552 Pain in left hip: Secondary | ICD-10-CM | POA: Diagnosis not present

## 2021-06-07 DIAGNOSIS — R262 Difficulty in walking, not elsewhere classified: Secondary | ICD-10-CM | POA: Diagnosis not present

## 2021-06-07 DIAGNOSIS — M25562 Pain in left knee: Secondary | ICD-10-CM | POA: Diagnosis not present

## 2021-06-07 DIAGNOSIS — M5442 Lumbago with sciatica, left side: Secondary | ICD-10-CM | POA: Diagnosis not present

## 2021-06-07 DIAGNOSIS — M5416 Radiculopathy, lumbar region: Secondary | ICD-10-CM | POA: Diagnosis not present

## 2021-06-07 DIAGNOSIS — M25552 Pain in left hip: Secondary | ICD-10-CM | POA: Diagnosis not present

## 2021-06-13 DIAGNOSIS — M25552 Pain in left hip: Secondary | ICD-10-CM | POA: Diagnosis not present

## 2021-06-13 DIAGNOSIS — M5416 Radiculopathy, lumbar region: Secondary | ICD-10-CM | POA: Diagnosis not present

## 2021-06-13 DIAGNOSIS — R262 Difficulty in walking, not elsewhere classified: Secondary | ICD-10-CM | POA: Diagnosis not present

## 2021-06-13 DIAGNOSIS — M5442 Lumbago with sciatica, left side: Secondary | ICD-10-CM | POA: Diagnosis not present

## 2021-06-13 DIAGNOSIS — M25562 Pain in left knee: Secondary | ICD-10-CM | POA: Diagnosis not present

## 2021-06-15 DIAGNOSIS — M25552 Pain in left hip: Secondary | ICD-10-CM | POA: Diagnosis not present

## 2021-06-15 DIAGNOSIS — R262 Difficulty in walking, not elsewhere classified: Secondary | ICD-10-CM | POA: Diagnosis not present

## 2021-06-15 DIAGNOSIS — M5416 Radiculopathy, lumbar region: Secondary | ICD-10-CM | POA: Diagnosis not present

## 2021-06-15 DIAGNOSIS — M25562 Pain in left knee: Secondary | ICD-10-CM | POA: Diagnosis not present

## 2021-06-15 DIAGNOSIS — M5442 Lumbago with sciatica, left side: Secondary | ICD-10-CM | POA: Diagnosis not present

## 2021-06-19 DIAGNOSIS — M25552 Pain in left hip: Secondary | ICD-10-CM | POA: Diagnosis not present

## 2021-06-19 DIAGNOSIS — M5416 Radiculopathy, lumbar region: Secondary | ICD-10-CM | POA: Diagnosis not present

## 2021-06-19 DIAGNOSIS — M25562 Pain in left knee: Secondary | ICD-10-CM | POA: Diagnosis not present

## 2021-06-19 DIAGNOSIS — R262 Difficulty in walking, not elsewhere classified: Secondary | ICD-10-CM | POA: Diagnosis not present

## 2021-06-19 DIAGNOSIS — M5442 Lumbago with sciatica, left side: Secondary | ICD-10-CM | POA: Diagnosis not present

## 2021-06-21 DIAGNOSIS — M5442 Lumbago with sciatica, left side: Secondary | ICD-10-CM | POA: Diagnosis not present

## 2021-06-21 DIAGNOSIS — M25552 Pain in left hip: Secondary | ICD-10-CM | POA: Diagnosis not present

## 2021-06-21 DIAGNOSIS — M25562 Pain in left knee: Secondary | ICD-10-CM | POA: Diagnosis not present

## 2021-06-21 DIAGNOSIS — R262 Difficulty in walking, not elsewhere classified: Secondary | ICD-10-CM | POA: Diagnosis not present

## 2021-06-21 DIAGNOSIS — M5416 Radiculopathy, lumbar region: Secondary | ICD-10-CM | POA: Diagnosis not present

## 2021-06-25 DIAGNOSIS — M5442 Lumbago with sciatica, left side: Secondary | ICD-10-CM | POA: Diagnosis not present

## 2021-06-25 DIAGNOSIS — M25552 Pain in left hip: Secondary | ICD-10-CM | POA: Diagnosis not present

## 2021-06-25 DIAGNOSIS — M25562 Pain in left knee: Secondary | ICD-10-CM | POA: Diagnosis not present

## 2021-06-25 DIAGNOSIS — R262 Difficulty in walking, not elsewhere classified: Secondary | ICD-10-CM | POA: Diagnosis not present

## 2021-06-25 DIAGNOSIS — M5416 Radiculopathy, lumbar region: Secondary | ICD-10-CM | POA: Diagnosis not present

## 2021-06-26 DIAGNOSIS — H401131 Primary open-angle glaucoma, bilateral, mild stage: Secondary | ICD-10-CM | POA: Diagnosis not present

## 2021-06-27 DIAGNOSIS — M25562 Pain in left knee: Secondary | ICD-10-CM | POA: Diagnosis not present

## 2021-06-27 DIAGNOSIS — R262 Difficulty in walking, not elsewhere classified: Secondary | ICD-10-CM | POA: Diagnosis not present

## 2021-06-27 DIAGNOSIS — M5416 Radiculopathy, lumbar region: Secondary | ICD-10-CM | POA: Diagnosis not present

## 2021-06-27 DIAGNOSIS — M25552 Pain in left hip: Secondary | ICD-10-CM | POA: Diagnosis not present

## 2021-06-27 DIAGNOSIS — M5442 Lumbago with sciatica, left side: Secondary | ICD-10-CM | POA: Diagnosis not present

## 2021-07-03 DIAGNOSIS — M5442 Lumbago with sciatica, left side: Secondary | ICD-10-CM | POA: Diagnosis not present

## 2021-07-03 DIAGNOSIS — M25562 Pain in left knee: Secondary | ICD-10-CM | POA: Diagnosis not present

## 2021-07-03 DIAGNOSIS — M25552 Pain in left hip: Secondary | ICD-10-CM | POA: Diagnosis not present

## 2021-07-03 DIAGNOSIS — M5416 Radiculopathy, lumbar region: Secondary | ICD-10-CM | POA: Diagnosis not present

## 2021-07-03 DIAGNOSIS — R262 Difficulty in walking, not elsewhere classified: Secondary | ICD-10-CM | POA: Diagnosis not present

## 2021-07-05 DIAGNOSIS — R262 Difficulty in walking, not elsewhere classified: Secondary | ICD-10-CM | POA: Diagnosis not present

## 2021-07-05 DIAGNOSIS — M5442 Lumbago with sciatica, left side: Secondary | ICD-10-CM | POA: Diagnosis not present

## 2021-07-05 DIAGNOSIS — M25562 Pain in left knee: Secondary | ICD-10-CM | POA: Diagnosis not present

## 2021-07-05 DIAGNOSIS — M25552 Pain in left hip: Secondary | ICD-10-CM | POA: Diagnosis not present

## 2021-07-05 DIAGNOSIS — M5416 Radiculopathy, lumbar region: Secondary | ICD-10-CM | POA: Diagnosis not present

## 2021-07-06 ENCOUNTER — Telehealth: Payer: Self-pay

## 2021-07-06 ENCOUNTER — Other Ambulatory Visit: Payer: Self-pay

## 2021-07-06 ENCOUNTER — Telehealth: Payer: Medicare Other | Admitting: Internal Medicine

## 2021-07-06 NOTE — Telephone Encounter (Signed)
Called and spoke with patient about appt today. Patient was told b her PT to see her PCP about edema in calves being more than usual.   Dr. Army Melia said patient needs to elevate her legs as much as possible, and push fluids.  If symptoms do no get better, patient will call the office next week and make an in-person appt.

## 2021-07-11 DIAGNOSIS — M25562 Pain in left knee: Secondary | ICD-10-CM | POA: Diagnosis not present

## 2021-07-11 DIAGNOSIS — M5416 Radiculopathy, lumbar region: Secondary | ICD-10-CM | POA: Diagnosis not present

## 2021-07-11 DIAGNOSIS — R262 Difficulty in walking, not elsewhere classified: Secondary | ICD-10-CM | POA: Diagnosis not present

## 2021-07-11 DIAGNOSIS — M5442 Lumbago with sciatica, left side: Secondary | ICD-10-CM | POA: Diagnosis not present

## 2021-07-11 DIAGNOSIS — M25552 Pain in left hip: Secondary | ICD-10-CM | POA: Diagnosis not present

## 2021-07-13 DIAGNOSIS — M5416 Radiculopathy, lumbar region: Secondary | ICD-10-CM | POA: Diagnosis not present

## 2021-07-13 DIAGNOSIS — M25562 Pain in left knee: Secondary | ICD-10-CM | POA: Diagnosis not present

## 2021-07-13 DIAGNOSIS — M25552 Pain in left hip: Secondary | ICD-10-CM | POA: Diagnosis not present

## 2021-07-13 DIAGNOSIS — R262 Difficulty in walking, not elsewhere classified: Secondary | ICD-10-CM | POA: Diagnosis not present

## 2021-07-13 DIAGNOSIS — M5442 Lumbago with sciatica, left side: Secondary | ICD-10-CM | POA: Diagnosis not present

## 2021-08-16 DIAGNOSIS — M5442 Lumbago with sciatica, left side: Secondary | ICD-10-CM | POA: Diagnosis not present

## 2021-08-16 DIAGNOSIS — R262 Difficulty in walking, not elsewhere classified: Secondary | ICD-10-CM | POA: Diagnosis not present

## 2021-08-16 DIAGNOSIS — M5416 Radiculopathy, lumbar region: Secondary | ICD-10-CM | POA: Diagnosis not present

## 2021-08-16 DIAGNOSIS — M25552 Pain in left hip: Secondary | ICD-10-CM | POA: Diagnosis not present

## 2021-08-16 DIAGNOSIS — M25562 Pain in left knee: Secondary | ICD-10-CM | POA: Diagnosis not present

## 2021-08-21 DIAGNOSIS — M25562 Pain in left knee: Secondary | ICD-10-CM | POA: Diagnosis not present

## 2021-08-21 DIAGNOSIS — M25552 Pain in left hip: Secondary | ICD-10-CM | POA: Diagnosis not present

## 2021-08-21 DIAGNOSIS — M5442 Lumbago with sciatica, left side: Secondary | ICD-10-CM | POA: Diagnosis not present

## 2021-08-21 DIAGNOSIS — M5416 Radiculopathy, lumbar region: Secondary | ICD-10-CM | POA: Diagnosis not present

## 2021-08-21 DIAGNOSIS — R262 Difficulty in walking, not elsewhere classified: Secondary | ICD-10-CM | POA: Diagnosis not present

## 2021-08-23 DIAGNOSIS — M25562 Pain in left knee: Secondary | ICD-10-CM | POA: Diagnosis not present

## 2021-08-23 DIAGNOSIS — M5416 Radiculopathy, lumbar region: Secondary | ICD-10-CM | POA: Diagnosis not present

## 2021-08-23 DIAGNOSIS — R262 Difficulty in walking, not elsewhere classified: Secondary | ICD-10-CM | POA: Diagnosis not present

## 2021-08-23 DIAGNOSIS — M25552 Pain in left hip: Secondary | ICD-10-CM | POA: Diagnosis not present

## 2021-08-23 DIAGNOSIS — M5442 Lumbago with sciatica, left side: Secondary | ICD-10-CM | POA: Diagnosis not present

## 2021-08-28 DIAGNOSIS — R262 Difficulty in walking, not elsewhere classified: Secondary | ICD-10-CM | POA: Diagnosis not present

## 2021-08-28 DIAGNOSIS — M25562 Pain in left knee: Secondary | ICD-10-CM | POA: Diagnosis not present

## 2021-08-28 DIAGNOSIS — M5416 Radiculopathy, lumbar region: Secondary | ICD-10-CM | POA: Diagnosis not present

## 2021-08-28 DIAGNOSIS — M5442 Lumbago with sciatica, left side: Secondary | ICD-10-CM | POA: Diagnosis not present

## 2021-08-28 DIAGNOSIS — M25552 Pain in left hip: Secondary | ICD-10-CM | POA: Diagnosis not present

## 2021-08-30 DIAGNOSIS — M25552 Pain in left hip: Secondary | ICD-10-CM | POA: Diagnosis not present

## 2021-08-30 DIAGNOSIS — R262 Difficulty in walking, not elsewhere classified: Secondary | ICD-10-CM | POA: Diagnosis not present

## 2021-08-30 DIAGNOSIS — M25562 Pain in left knee: Secondary | ICD-10-CM | POA: Diagnosis not present

## 2021-08-30 DIAGNOSIS — M5442 Lumbago with sciatica, left side: Secondary | ICD-10-CM | POA: Diagnosis not present

## 2021-08-30 DIAGNOSIS — M5416 Radiculopathy, lumbar region: Secondary | ICD-10-CM | POA: Diagnosis not present

## 2021-09-04 DIAGNOSIS — M25562 Pain in left knee: Secondary | ICD-10-CM | POA: Diagnosis not present

## 2021-09-04 DIAGNOSIS — R262 Difficulty in walking, not elsewhere classified: Secondary | ICD-10-CM | POA: Diagnosis not present

## 2021-09-04 DIAGNOSIS — M25552 Pain in left hip: Secondary | ICD-10-CM | POA: Diagnosis not present

## 2021-09-04 DIAGNOSIS — M5416 Radiculopathy, lumbar region: Secondary | ICD-10-CM | POA: Diagnosis not present

## 2021-09-04 DIAGNOSIS — M5442 Lumbago with sciatica, left side: Secondary | ICD-10-CM | POA: Diagnosis not present

## 2021-09-06 DIAGNOSIS — M5416 Radiculopathy, lumbar region: Secondary | ICD-10-CM | POA: Diagnosis not present

## 2021-09-06 DIAGNOSIS — M5442 Lumbago with sciatica, left side: Secondary | ICD-10-CM | POA: Diagnosis not present

## 2021-09-06 DIAGNOSIS — M25562 Pain in left knee: Secondary | ICD-10-CM | POA: Diagnosis not present

## 2021-09-06 DIAGNOSIS — M25552 Pain in left hip: Secondary | ICD-10-CM | POA: Diagnosis not present

## 2021-09-06 DIAGNOSIS — R262 Difficulty in walking, not elsewhere classified: Secondary | ICD-10-CM | POA: Diagnosis not present

## 2021-09-11 DIAGNOSIS — M5416 Radiculopathy, lumbar region: Secondary | ICD-10-CM | POA: Diagnosis not present

## 2021-09-11 DIAGNOSIS — R262 Difficulty in walking, not elsewhere classified: Secondary | ICD-10-CM | POA: Diagnosis not present

## 2021-09-11 DIAGNOSIS — M25562 Pain in left knee: Secondary | ICD-10-CM | POA: Diagnosis not present

## 2021-09-11 DIAGNOSIS — M5442 Lumbago with sciatica, left side: Secondary | ICD-10-CM | POA: Diagnosis not present

## 2021-09-11 DIAGNOSIS — M25552 Pain in left hip: Secondary | ICD-10-CM | POA: Diagnosis not present

## 2021-09-13 DIAGNOSIS — M25552 Pain in left hip: Secondary | ICD-10-CM | POA: Diagnosis not present

## 2021-09-13 DIAGNOSIS — R262 Difficulty in walking, not elsewhere classified: Secondary | ICD-10-CM | POA: Diagnosis not present

## 2021-09-13 DIAGNOSIS — M5442 Lumbago with sciatica, left side: Secondary | ICD-10-CM | POA: Diagnosis not present

## 2021-09-13 DIAGNOSIS — M5416 Radiculopathy, lumbar region: Secondary | ICD-10-CM | POA: Diagnosis not present

## 2021-09-13 DIAGNOSIS — M25562 Pain in left knee: Secondary | ICD-10-CM | POA: Diagnosis not present

## 2021-09-26 DIAGNOSIS — R262 Difficulty in walking, not elsewhere classified: Secondary | ICD-10-CM | POA: Diagnosis not present

## 2021-09-26 DIAGNOSIS — M25562 Pain in left knee: Secondary | ICD-10-CM | POA: Diagnosis not present

## 2021-09-26 DIAGNOSIS — M5442 Lumbago with sciatica, left side: Secondary | ICD-10-CM | POA: Diagnosis not present

## 2021-09-26 DIAGNOSIS — M25552 Pain in left hip: Secondary | ICD-10-CM | POA: Diagnosis not present

## 2021-09-26 DIAGNOSIS — M5416 Radiculopathy, lumbar region: Secondary | ICD-10-CM | POA: Diagnosis not present

## 2021-09-28 ENCOUNTER — Ambulatory Visit: Payer: Self-pay

## 2021-09-28 ENCOUNTER — Encounter: Payer: Self-pay | Admitting: *Deleted

## 2021-09-28 DIAGNOSIS — R262 Difficulty in walking, not elsewhere classified: Secondary | ICD-10-CM | POA: Diagnosis not present

## 2021-09-28 DIAGNOSIS — M5442 Lumbago with sciatica, left side: Secondary | ICD-10-CM | POA: Diagnosis not present

## 2021-09-28 DIAGNOSIS — M25562 Pain in left knee: Secondary | ICD-10-CM | POA: Diagnosis not present

## 2021-09-28 DIAGNOSIS — M25552 Pain in left hip: Secondary | ICD-10-CM | POA: Diagnosis not present

## 2021-09-28 DIAGNOSIS — M5416 Radiculopathy, lumbar region: Secondary | ICD-10-CM | POA: Diagnosis not present

## 2021-09-28 NOTE — Telephone Encounter (Signed)
Callled pt and LMOM.

## 2021-09-28 NOTE — Telephone Encounter (Signed)
No answer. Left message.

## 2021-10-01 NOTE — Telephone Encounter (Signed)
This encounter was created in error - please disregard.

## 2021-10-01 NOTE — Telephone Encounter (Signed)
Spoke to pt today she stated she fells better. Pt will call use back if she doesn't feel well again.  KP

## 2021-10-01 NOTE — Telephone Encounter (Addendum)
Pt stated that she is concerned about her blood pressure. Pt stated that it is normally higher than her reaing of 127/52. Pt wanted to know if she should discontinue any medications.  Called pt  - left message on machine.  Unable to contact pt. Forwarded message to clinic.

## 2021-10-03 DIAGNOSIS — R262 Difficulty in walking, not elsewhere classified: Secondary | ICD-10-CM | POA: Diagnosis not present

## 2021-10-03 DIAGNOSIS — M5416 Radiculopathy, lumbar region: Secondary | ICD-10-CM | POA: Diagnosis not present

## 2021-10-03 DIAGNOSIS — M25552 Pain in left hip: Secondary | ICD-10-CM | POA: Diagnosis not present

## 2021-10-03 DIAGNOSIS — M25562 Pain in left knee: Secondary | ICD-10-CM | POA: Diagnosis not present

## 2021-10-03 DIAGNOSIS — M5442 Lumbago with sciatica, left side: Secondary | ICD-10-CM | POA: Diagnosis not present

## 2021-10-05 DIAGNOSIS — M5442 Lumbago with sciatica, left side: Secondary | ICD-10-CM | POA: Diagnosis not present

## 2021-10-05 DIAGNOSIS — M25552 Pain in left hip: Secondary | ICD-10-CM | POA: Diagnosis not present

## 2021-10-05 DIAGNOSIS — R262 Difficulty in walking, not elsewhere classified: Secondary | ICD-10-CM | POA: Diagnosis not present

## 2021-10-05 DIAGNOSIS — M25562 Pain in left knee: Secondary | ICD-10-CM | POA: Diagnosis not present

## 2021-10-05 DIAGNOSIS — M5416 Radiculopathy, lumbar region: Secondary | ICD-10-CM | POA: Diagnosis not present

## 2021-10-06 ENCOUNTER — Other Ambulatory Visit: Payer: Self-pay | Admitting: Internal Medicine

## 2021-10-06 DIAGNOSIS — I872 Venous insufficiency (chronic) (peripheral): Secondary | ICD-10-CM

## 2021-10-08 DIAGNOSIS — M25552 Pain in left hip: Secondary | ICD-10-CM | POA: Diagnosis not present

## 2021-10-08 DIAGNOSIS — M25562 Pain in left knee: Secondary | ICD-10-CM | POA: Diagnosis not present

## 2021-10-08 DIAGNOSIS — M5416 Radiculopathy, lumbar region: Secondary | ICD-10-CM | POA: Diagnosis not present

## 2021-10-08 DIAGNOSIS — M5442 Lumbago with sciatica, left side: Secondary | ICD-10-CM | POA: Diagnosis not present

## 2021-10-08 DIAGNOSIS — R262 Difficulty in walking, not elsewhere classified: Secondary | ICD-10-CM | POA: Diagnosis not present

## 2021-10-08 NOTE — Telephone Encounter (Signed)
Refilled 11/29/2020 #45 3 refills. Should have enough to last until 11/29/2021. Requested Prescriptions  Pending Prescriptions Disp Refills  . hydrochlorothiazide (HYDRODIURIL) 25 MG tablet [Pharmacy Med Name: HYDROCHLOROTHIAZIDE 25 MG TAB] 45 tablet 3    Sig: TAKE 1/2 TABLET BY MOUTH EVERY DAY     Cardiovascular: Diuretics - Thiazide Failed - 10/06/2021 10:28 AM      Failed - K in normal range and within 180 days    Potassium  Date Value Ref Range Status  05/13/2021 3.1 (L) 3.5 - 5.1 mmol/L Final         Failed - Na in normal range and within 180 days    Sodium  Date Value Ref Range Status  05/13/2021 133 (L) 135 - 145 mmol/L Final  04/12/2021 138 134 - 144 mmol/L Final         Failed - Last BP in normal range    BP Readings from Last 1 Encounters:  05/13/21 140/66         Passed - Cr in normal range and within 180 days    Creatinine, Ser  Date Value Ref Range Status  05/13/2021 0.69 0.44 - 1.00 mg/dL Final         Passed - Valid encounter within last 6 months    Recent Outpatient Visits          5 months ago Acute left-sided low back pain with left-sided sciatica   [facility] Glean Hess, MD   6 months ago Essential (primary) hypertension   [facility] Glean Hess, MD   1 year ago Encounter for screening mammogram for breast cancer   [facility] Glean Hess, MD   1 year ago Prediabetes   [facility] Glean Hess, MD   1 year ago Essential (primary) hypertension   [facility] Glean Hess, MD

## 2021-10-10 DIAGNOSIS — M25562 Pain in left knee: Secondary | ICD-10-CM | POA: Diagnosis not present

## 2021-10-10 DIAGNOSIS — M5416 Radiculopathy, lumbar region: Secondary | ICD-10-CM | POA: Diagnosis not present

## 2021-10-10 DIAGNOSIS — R262 Difficulty in walking, not elsewhere classified: Secondary | ICD-10-CM | POA: Diagnosis not present

## 2021-10-10 DIAGNOSIS — M25552 Pain in left hip: Secondary | ICD-10-CM | POA: Diagnosis not present

## 2021-10-10 DIAGNOSIS — M5442 Lumbago with sciatica, left side: Secondary | ICD-10-CM | POA: Diagnosis not present

## 2021-10-15 DIAGNOSIS — M5416 Radiculopathy, lumbar region: Secondary | ICD-10-CM | POA: Diagnosis not present

## 2021-10-15 DIAGNOSIS — M25552 Pain in left hip: Secondary | ICD-10-CM | POA: Diagnosis not present

## 2021-10-15 DIAGNOSIS — M25562 Pain in left knee: Secondary | ICD-10-CM | POA: Diagnosis not present

## 2021-10-15 DIAGNOSIS — M5442 Lumbago with sciatica, left side: Secondary | ICD-10-CM | POA: Diagnosis not present

## 2021-10-15 DIAGNOSIS — R262 Difficulty in walking, not elsewhere classified: Secondary | ICD-10-CM | POA: Diagnosis not present

## 2021-10-17 DIAGNOSIS — R262 Difficulty in walking, not elsewhere classified: Secondary | ICD-10-CM | POA: Diagnosis not present

## 2021-10-17 DIAGNOSIS — M25562 Pain in left knee: Secondary | ICD-10-CM | POA: Diagnosis not present

## 2021-10-17 DIAGNOSIS — M5416 Radiculopathy, lumbar region: Secondary | ICD-10-CM | POA: Diagnosis not present

## 2021-10-17 DIAGNOSIS — M5442 Lumbago with sciatica, left side: Secondary | ICD-10-CM | POA: Diagnosis not present

## 2021-10-17 DIAGNOSIS — M25552 Pain in left hip: Secondary | ICD-10-CM | POA: Diagnosis not present

## 2021-10-22 DIAGNOSIS — M5416 Radiculopathy, lumbar region: Secondary | ICD-10-CM | POA: Diagnosis not present

## 2021-10-22 DIAGNOSIS — M5442 Lumbago with sciatica, left side: Secondary | ICD-10-CM | POA: Diagnosis not present

## 2021-10-22 DIAGNOSIS — R262 Difficulty in walking, not elsewhere classified: Secondary | ICD-10-CM | POA: Diagnosis not present

## 2021-10-22 DIAGNOSIS — M25552 Pain in left hip: Secondary | ICD-10-CM | POA: Diagnosis not present

## 2021-10-22 DIAGNOSIS — M25562 Pain in left knee: Secondary | ICD-10-CM | POA: Diagnosis not present

## 2021-10-24 DIAGNOSIS — M25552 Pain in left hip: Secondary | ICD-10-CM | POA: Diagnosis not present

## 2021-10-24 DIAGNOSIS — R262 Difficulty in walking, not elsewhere classified: Secondary | ICD-10-CM | POA: Diagnosis not present

## 2021-10-24 DIAGNOSIS — M5442 Lumbago with sciatica, left side: Secondary | ICD-10-CM | POA: Diagnosis not present

## 2021-10-24 DIAGNOSIS — M5416 Radiculopathy, lumbar region: Secondary | ICD-10-CM | POA: Diagnosis not present

## 2021-10-24 DIAGNOSIS — M25562 Pain in left knee: Secondary | ICD-10-CM | POA: Diagnosis not present

## 2021-10-25 ENCOUNTER — Ambulatory Visit: Payer: Self-pay | Admitting: *Deleted

## 2021-10-25 NOTE — Telephone Encounter (Signed)
? ? ?  Chief Complaint: BP fluctuating ?Symptoms: Fall 2 weeks ago, states unsure if passed out or tripped. Head laceration and lump, fell onto back, tailbone. States sore for a while, no issues,pain presently. Concern is her BP.fluctuating. SHe has no values except lowest 127/50"Something" day she fell. Last BP 133/69. ?Frequency: Occurred 2 weeks ago ?Pertinent Negatives: Patient denies dizziness, headache ?Disposition: '[]'$ ED /'[]'$ Urgent Care (no appt availability in office) / '[x]'$ Appointment(In office/virtual)/ '[]'$  [facility]/ '[]'$ Home Care/ '[]'$ Refused Recommended Disposition /'[]'$ [facility]/ '[]'$  Follow-up with PCP ?Additional Notes: Appt secured according to pts schedule, preference. Placed on wait list. Care advise given, verbalizes understanding.  Reason for Disposition ? [1] Prolonged standing caused simple fainting AND [2] now alert and feels fine ?   Unsure if passed out or tripped ? ?Answer Assessment - Initial Assessment Questions ?1. ONSET: "How long were you unconscious?" (minutes) "When did it happen?" ?    Unsure ?2. CONTENT: "What happened during period of unconsciousness?" (e.g., seizure activity)  ?    Unsure, occurred 2 weeks ago ?3. MENTAL STATUS: "Alert and oriented now?" (oriented x 3 = name, month, location)  ?    yes ?4. TRIGGER: "What do you think caused the fainting?" "What were you doing just before you fainted?"  (e.g., exercise, sudden standing up, prolonged standing) ?    Passed out or tripped, not sure ?5. RECURRENT SYMPTOM: "Have you ever passed out before?" If Yes, ask: "When was the last time?" and "What happened that time?"  ?    WHen I don't eat in mornings, not in years ?6. INJURY: "Did you sustain any injury during the fall?"  ?   Tailbone and back, laceration of head ?7. CARDIAC SYMPTOMS: "Have you had any of the following symptoms: chest pain, difficulty breathing, palpitations?" ?    no ?8. NEUROLOGIC SYMPTOMS: "Have you had any of the following symptoms:  headache, numbness, vertigo, weakness?" ?    no ?9. GI SYMPTOMS: "Have you had any of the following symptoms: abdominal pain, vomiting, diarrhea, blood in stools?" ?    No ?10. OTHER SYMPTOMS: "Do you have any other symptoms?" ?      BP fluctuating all over since fall ? ?Protocols used: Fainting-A-AH ? ?

## 2021-10-29 DIAGNOSIS — M5416 Radiculopathy, lumbar region: Secondary | ICD-10-CM | POA: Diagnosis not present

## 2021-10-29 DIAGNOSIS — M5442 Lumbago with sciatica, left side: Secondary | ICD-10-CM | POA: Diagnosis not present

## 2021-10-29 DIAGNOSIS — M25562 Pain in left knee: Secondary | ICD-10-CM | POA: Diagnosis not present

## 2021-10-29 DIAGNOSIS — M25552 Pain in left hip: Secondary | ICD-10-CM | POA: Diagnosis not present

## 2021-10-29 DIAGNOSIS — R262 Difficulty in walking, not elsewhere classified: Secondary | ICD-10-CM | POA: Diagnosis not present

## 2021-10-30 ENCOUNTER — Encounter: Payer: Self-pay | Admitting: Internal Medicine

## 2021-10-30 ENCOUNTER — Other Ambulatory Visit: Payer: Self-pay

## 2021-10-30 ENCOUNTER — Ambulatory Visit (INDEPENDENT_AMBULATORY_CARE_PROVIDER_SITE_OTHER): Payer: Medicare Other | Admitting: Internal Medicine

## 2021-10-30 VITALS — BP 138/78 | HR 73 | Ht 64.0 in | Wt 173.0 lb

## 2021-10-30 DIAGNOSIS — I10 Essential (primary) hypertension: Secondary | ICD-10-CM | POA: Diagnosis not present

## 2021-10-30 DIAGNOSIS — N3281 Overactive bladder: Secondary | ICD-10-CM

## 2021-10-30 DIAGNOSIS — I6523 Occlusion and stenosis of bilateral carotid arteries: Secondary | ICD-10-CM

## 2021-10-30 MED ORDER — MIRABEGRON ER 50 MG PO TB24: 50.0000 mg | ORAL_TABLET | Freq: Every day | ORAL | 0 refills | Status: DC

## 2021-10-30 NOTE — Progress Notes (Signed)
? ? ?Date:  10/30/2021  ? ?Name:  Bonnie Jackson   DOB:  1940/11/21   MRN:  [id number] ? ? ?Chief Complaint: Hypertension ? ?Hypertension ?This is a chronic problem. The problem is controlled. Pertinent negatives include no chest pain, headaches, palpitations or shortness of breath. Past treatments include ACE inhibitors and diuretics. Hypertensive end-organ damage includes CVA. There is no history of kidney disease or CAD/MI.  ?Loss of Consciousness ?This is a new problem. Episode onset: 2 weeks ago. She lost consciousness for a period of less than 1 minute. Nothing aggravates the symptoms. Pertinent negatives include no abdominal pain, chest pain, dizziness, headaches, light-headedness, palpitations or weakness.  ?OAB - she gets up to urinate 4 times at night and has trouble controlling the flow to get to the bath.  She wears depends for protection and they are filled.  She stops drinking hours before bedtime.  She takes her BP medications in the morning. ?Lab Results  ?Component Value Date  ? NA 133 (L) 05/13/2021  ? K 3.1 (L) 05/13/2021  ? CO2 28 05/13/2021  ? GLUCOSE 155 (H) 05/13/2021  ? BUN 22 05/13/2021  ? CREATININE 0.69 05/13/2021  ? CALCIUM 10.4 (H) 05/13/2021  ? EGFR 77 04/12/2021  ? GFRNONAA >60 05/13/2021  ? ?Lab Results  ?Component Value Date  ? CHOL 226 (H) 04/12/2021  ? HDL 52 04/12/2021  ? LDLCALC 156 (H) 04/12/2021  ? TRIG 101 04/12/2021  ? CHOLHDL 4.3 04/12/2021  ? ?Lab Results  ?Component Value Date  ? TSH 1.940 11/25/2019  ? ?Lab Results  ?Component Value Date  ? HGBA1C 6.6 (H) 04/12/2021  ? ?Lab Results  ?Component Value Date  ? WBC 12.8 (H) 05/13/2021  ? HGB 15.2 (H) 05/13/2021  ? HCT 42.7 05/13/2021  ? MCV 91.4 05/13/2021  ? PLT 311 05/13/2021  ? ?Lab Results  ?Component Value Date  ? ALT 16 04/12/2021  ? AST 16 04/12/2021  ? ALKPHOS 57 04/12/2021  ? BILITOT 0.5 04/12/2021  ? ?No results found for: 25OHVITD2, [place], VD25OH  ? ?Review of Systems  ?Constitutional:  Negative for fatigue and  unexpected weight change.  ?HENT:  Negative for nosebleeds.   ?Eyes:  Negative for visual disturbance.  ?Respiratory:  Negative for cough, chest tightness, shortness of breath and wheezing.   ?Cardiovascular:  Positive for leg swelling and syncope. Negative for chest pain and palpitations.  ?Gastrointestinal:  Negative for abdominal pain, constipation and diarrhea.  ?Genitourinary:  Positive for frequency and urgency.  ?Neurological:  Negative for dizziness, weakness, light-headedness and headaches.  ? ?Patient Active Problem List  ? Diagnosis Date Noted  ? Prediabetes 06/01/2020  ? Carotid artery stenosis 10/16/2018  ? B12 nutritional deficiency 08/20/2018  ? Neuropathy 06/06/2017  ? Hearing loss of right ear 06/06/2017  ? Gait instability 03/27/2017  ? Muscle fasciculation 03/27/2017  ? Gastroesophageal reflux disease 03/27/2017  ? OAB (overactive bladder) 05/22/2016  ? Cataract of right eye 01/19/2016  ? 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 Neoplasm of uncertain behavior of skin 01/19/2016  ? Thalamic infarction ([place]) 11/16/2015  ? Aneurysm, cerebral, nonruptured 01/04/2015  ? Essential (primary) hypertension 01/04/2015  ? Mixed hyperlipidemia 01/04/2015  ? Idiopathic insomnia 01/04/2015  ? Special screening for malignant neoplasms, colon 01/04/2015  ? ? ?No Known Allergies ? ?Past Surgical History:  ?Procedure Laterality Date  ? ABDOMINAL HERNIA REPAIR    ? CATARACT EXTRACTION W/PHACO Right 06/24/2016  ? Procedure: CATARACT EXTRACTION PHACO AND INTRAOCULAR LENS PLACEMENT (IOC);  Surgeon: Ronnell Freshwater, MD;  Location: [place];  Service: Ophthalmology;  Laterality: Right;  RIGHT  ? CATARACT EXTRACTION W/PHACO Left 10/25/2019  ? Procedure: CATARACT EXTRACTION PHACO AND INTRAOCULAR LENS PLACEMENT (IOC) LEFT 4.00  00:41.9 ;  Surgeon: Eulogio Bear, MD;  Location: [facility];  Service: Ophthalmology;  Laterality: Left;  ? CHOLECYSTECTOMY    ? IR GENERIC HISTORICAL  01/12/2016  ? IR  ANGIO INTRA EXTRACRAN SEL INTERNAL CAROTID BILAT MOD SED 01/12/2016 Consuella Lose, MD [facility]  ? IR GENERIC HISTORICAL  01/12/2016  ? IR ANGIO VERTEBRAL SEL VERTEBRAL BILAT MOD SED 01/12/2016 Consuella Lose, MD [facility]  ? IR GENERIC HISTORICAL  01/12/2016  ? IR 3D INDEPENDENT WKST 01/12/2016 Consuella Lose, MD [facility]  ? REPLACEMENT TOTAL KNEE Bilateral   ? TOTAL ABDOMINAL HYSTERECTOMY    ? ? ?Social History  ? ?Tobacco Use  ? Smoking status: Never  ? Smokeless tobacco: Never  ? Tobacco comments:  ?  smoking cessation materials not required  ?Vaping Use  ? Vaping Use: Never used  ?Substance Use Topics  ? Alcohol use: Yes  ?  Alcohol/week: 1.0 standard drink  ?  Types: 1 Cans of beer per week  ?  Comment:    ? Drug use: No  ? ? ? ?Medication list has been reviewed and updated. ? ?Current Meds  ?Medication Sig  ? aspirin EC 325 MG tablet Take 325 mg by mouth daily.   ? atorvastatin (LIPITOR) 20 MG tablet TAKE 1 TABLET BY MOUTH DAILY AT 6 PM.  ? hydrochlorothiazide (HYDRODIURIL) 25 MG tablet TAKE 1/2 TABLET BY MOUTH EVERY DAY  ? latanoprost (XALATAN) 0.005 % ophthalmic solution 1 drop at bedtime.  ? lisinopril-hydrochlorothiazide (ZESTORETIC) 10-12.5 MG tablet TAKE 1 TABLET BY MOUTH EVERY DAY  ? mirabegron ER (MYRBETRIQ) 50 MG TB24 tablet Take 1 tablet (50 mg total) by mouth daily.  ? [DISCONTINUED] Cholecalciferol (VITAMIN D-3) 25 MCG (1000 UT) CAPS Take by mouth daily.  ? ? ? ?  10/30/2021  ? 10:45 AM 05/01/2021  ?  4:21 PM 03/29/2021  ?  8:48 AM 09/28/2020  ?  9:59 AM  ?PHQ 2/9 Scores  ?PHQ - 2 Score 2 0 0 6  ?PHQ- 9 Score '5 1 3 9  ' ? ? ? ?  10/30/2021  ? 10:46 AM 05/01/2021  ?  4:21 PM 03/29/2021  ?  8:48 AM 09/28/2020  ?  9:59 AM  ?GAD 7 : Generalized Anxiety Score  ?Nervous, Anxious, on Edge 0 0 0 2  ?Control/stop worrying 0 0 0 2  ?Worry too much - different things 2 0 0 2  ?Trouble relaxing 2 0 0 3  ?Restless 0 0 0 3  ?Easily annoyed or irritable 0 0 0 0  ?Afraid - awful might happen 0 0 0 0  ?Total  GAD 7 Score 4 0 0 12  ?Anxiety Difficulty  Not difficult at all  Not difficult at all  ? ? ?BP Readings from Last 3 Encounters:  ?10/30/21 138/78  ?05/13/21 140/66  ?05/01/21 128/78  ? ? ?Physical Exam ?Vitals and nursing note reviewed.  ?Constitutional:   ?   General: She is not in acute distress. ?   Appearance: Normal appearance. She is well-developed.  ?HENT:  ?   Head: Normocephalic and atraumatic.  ?Neck:  ?   Vascular: No carotid bruit.  ?Cardiovascular:  ?   Rate and Rhythm: Normal rate and regular rhythm.  ?Pulmonary:  ?  Effort: Pulmonary effort is normal. No respiratory distress.  ?   Breath sounds: No wheezing or rhonchi.  ?Musculoskeletal:     ?   General: Swelling present.  ?   Cervical back: Normal range of motion.  ?   Right lower leg: No edema.  ?   Left lower leg: No edema.  ?Lymphadenopathy:  ?   Cervical: No cervical adenopathy.  ?Skin: ?   General: Skin is warm and dry.  ?   Capillary Refill: Capillary refill takes less than 2 seconds.  ?   Findings: No rash.  ? ?    ?   Comments: Scalp laceration healed.  ?Neurological:  ?   General: No focal deficit present.  ?   Mental Status: She is alert and oriented to person, place, and time.  ?Psychiatric:     ?   Mood and Affect: Mood normal.     ?   Behavior: Behavior normal.  ? ? ?Wt Readings from Last 3 Encounters:  ?10/30/21 173 lb (78.5 kg)  ?05/13/21 180 lb (81.6 kg)  ?05/01/21 170 lb (77.1 kg)  ? ? ?BP 138/78 (BP Location: Right Arm, Cuff Size: Large)   Pulse 73   Ht '5\' 4"'  (1.626 m)   Wt 173 lb (78.5 kg)   SpO2 98%   BMI 29.70 kg/m?  ? ?Assessment and Plan: ?1. Essential (primary) hypertension ?Clinically stable exam with well controlled BP.  Continue lisinopril hct daily with HCTZ 12.5 mg prn. ?Tolerating medications without side effects at this time. ?Pt to continue current regimen and low sodium diet; benefits of regular exercise as able discussed. ? ?2. Bilateral carotid artery stenosis ?Has not had follow up in several years. ?Recent  syncopal episode is concerning so will order [place] then discuss options ?Recommend taking atorvastatin daily. ?- US Carotid Duplex Bilateral ? ?3. Overactive bladder ?Trial of Myrbetriq qAM ?- mirabegron ER (MYRBETRIQ)

## 2021-10-31 ENCOUNTER — Ambulatory Visit: Payer: Medicare Other | Admitting: Internal Medicine

## 2021-10-31 ENCOUNTER — Other Ambulatory Visit: Payer: Self-pay | Admitting: Internal Medicine

## 2021-10-31 DIAGNOSIS — M5416 Radiculopathy, lumbar region: Secondary | ICD-10-CM | POA: Diagnosis not present

## 2021-10-31 DIAGNOSIS — M5442 Lumbago with sciatica, left side: Secondary | ICD-10-CM | POA: Diagnosis not present

## 2021-10-31 DIAGNOSIS — R262 Difficulty in walking, not elsewhere classified: Secondary | ICD-10-CM | POA: Diagnosis not present

## 2021-10-31 DIAGNOSIS — I10 Essential (primary) hypertension: Secondary | ICD-10-CM

## 2021-10-31 DIAGNOSIS — M25562 Pain in left knee: Secondary | ICD-10-CM | POA: Diagnosis not present

## 2021-10-31 DIAGNOSIS — M25552 Pain in left hip: Secondary | ICD-10-CM | POA: Diagnosis not present

## 2021-11-01 NOTE — Telephone Encounter (Signed)
Requested medications are due for refill today.  yes ? ?Requested medications are on the active medications list.  yes ? ?Last refill. 04/23/2021 #90 1 refill ? ?Future visit scheduled.   yes ? ?Notes to clinic.  Failed refill protocol due to abnormal labs. ? ? ? ?Requested Prescriptions  ?Pending Prescriptions Disp Refills  ? lisinopril-hydrochlorothiazide (ZESTORETIC) 10-12.5 MG tablet [Pharmacy Med Name: LISINOPRIL-HCTZ 10-12.5 MG TAB] 90 tablet 1  ?  Sig: TAKE 1 TABLET BY MOUTH EVERY DAY  ?  ? Cardiovascular:  ACEI + Diuretic Combos Failed - 10/31/2021  1:46 AM  ?  ?  Failed - Na in normal range and within 180 days  ?  Sodium  ?Date Value Ref Range Status  ?05/13/2021 133 (L) 135 - 145 mmol/L Final  ?04/12/2021 138 134 - 144 mmol/L Final  ?  ?  ?  ?  Failed - K in normal range and within 180 days  ?  Potassium  ?Date Value Ref Range Status  ?05/13/2021 3.1 (L) 3.5 - 5.1 mmol/L Final  ?  ?  ?  ?  Passed - Cr in normal range and within 180 days  ?  Creatinine, Ser  ?Date Value Ref Range Status  ?05/13/2021 0.69 0.44 - 1.00 mg/dL Final  ?  ?  ?  ?  Passed - eGFR is 30 or above and within 180 days  ?  GFR calc Af Amer  ?Date Value Ref Range Status  ?11/25/2019 97 >59 mL/min/1.73 Final  ? ?GFR, Estimated  ?Date Value Ref Range Status  ?05/13/2021 >60 >60 mL/min Final  ?  Comment:  ?  (NOTE) ?Calculated using the CKD-EPI Creatinine Equation (2021) ?  ? ?eGFR  ?Date Value Ref Range Status  ?04/12/2021 77 >59 mL/min/1.73 Final  ?  ?  ?  ?  Passed - Patient is not pregnant  ?  ?  Passed - Last BP in normal range  ?  BP Readings from Last 1 Encounters:  ?10/30/21 138/78  ?  ?  ?  ?  Passed - Valid encounter within last 6 months  ?  Recent Outpatient Visits   ? ?      ? 2 days ago Essential (primary) hypertension  ? [facility] Glean Hess, MD  ? 6 months ago Acute left-sided low back pain with left-sided sciatica  ? [facility] Glean Hess, MD  ? 7 months ago Essential (primary) hypertension   ? [facility] Glean Hess, MD  ? 1 year ago Encounter for screening mammogram for breast cancer  ? [facility] Glean Hess, MD  ? 1 year ago Prediabetes  ? [facility] Glean Hess, MD  ? ?  ?  ?Future Appointments   ? ?        ? In 6 months Army Melia Jesse Sans, MD [facility], [place]  ? ?  ? ?  ?  ?  ?  ?

## 2021-11-05 DIAGNOSIS — R262 Difficulty in walking, not elsewhere classified: Secondary | ICD-10-CM | POA: Diagnosis not present

## 2021-11-05 DIAGNOSIS — M5416 Radiculopathy, lumbar region: Secondary | ICD-10-CM | POA: Diagnosis not present

## 2021-11-05 DIAGNOSIS — M5442 Lumbago with sciatica, left side: Secondary | ICD-10-CM | POA: Diagnosis not present

## 2021-11-05 DIAGNOSIS — M25562 Pain in left knee: Secondary | ICD-10-CM | POA: Diagnosis not present

## 2021-11-05 DIAGNOSIS — M25552 Pain in left hip: Secondary | ICD-10-CM | POA: Diagnosis not present

## 2021-11-07 DIAGNOSIS — M25562 Pain in left knee: Secondary | ICD-10-CM | POA: Diagnosis not present

## 2021-11-07 DIAGNOSIS — M5416 Radiculopathy, lumbar region: Secondary | ICD-10-CM | POA: Diagnosis not present

## 2021-11-07 DIAGNOSIS — M25552 Pain in left hip: Secondary | ICD-10-CM | POA: Diagnosis not present

## 2021-11-07 DIAGNOSIS — R262 Difficulty in walking, not elsewhere classified: Secondary | ICD-10-CM | POA: Diagnosis not present

## 2021-11-07 DIAGNOSIS — M5442 Lumbago with sciatica, left side: Secondary | ICD-10-CM | POA: Diagnosis not present

## 2021-11-13 ENCOUNTER — Ambulatory Visit
Admission: RE | Admit: 2021-11-13 | Discharge: 2021-11-13 | Disposition: A | Discharge status: Home / Self Care | Payer: Medicare Other | Source: Ambulatory Visit | Attending: Internal Medicine | Admitting: Internal Medicine

## 2021-11-13 ENCOUNTER — Other Ambulatory Visit: Payer: Medicare Other

## 2021-11-13 DIAGNOSIS — I6523 Occlusion and stenosis of bilateral carotid arteries: Secondary | ICD-10-CM | POA: Insufficient documentation

## 2021-11-13 DIAGNOSIS — I771 Stricture of artery: Secondary | ICD-10-CM | POA: Diagnosis not present

## 2021-11-14 ENCOUNTER — Other Ambulatory Visit: Payer: Self-pay

## 2021-11-14 DIAGNOSIS — I6523 Occlusion and stenosis of bilateral carotid arteries: Secondary | ICD-10-CM

## 2021-11-14 NOTE — Progress Notes (Signed)
Referral placed.  KP 

## 2021-11-15 ENCOUNTER — Other Ambulatory Visit: Payer: Self-pay | Admitting: Internal Medicine

## 2021-11-15 DIAGNOSIS — E782 Mixed hyperlipidemia: Secondary | ICD-10-CM

## 2021-11-15 DIAGNOSIS — I6381 Other cerebral infarction due to occlusion or stenosis of small artery: Secondary | ICD-10-CM

## 2021-11-15 NOTE — Telephone Encounter (Signed)
Requested Prescriptions  ?Pending Prescriptions Disp Refills  ?? atorvastatin (LIPITOR) 20 MG tablet [Pharmacy Med Name: ATORVASTATIN 20 MG TABLET] 90 tablet 1  ?  Sig: TAKE 1 TABLET BY MOUTH DAILY AT 6 PM.  ?  ? Cardiovascular:  Antilipid - Statins Failed - 11/15/2021  2:52 AM  ?  ?  Failed - Lipid Panel in normal range within the last 12 months  ?  Cholesterol, Total  ?Date Value Ref Range Status  ?04/12/2021 226 (H) 100 - 199 mg/dL Final  ? ?LDL Chol Calc (NIH)  ?Date Value Ref Range Status  ?04/12/2021 156 (H) 0 - 99 mg/dL Final  ? ?HDL  ?Date Value Ref Range Status  ?04/12/2021 52 >39 mg/dL Final  ? ?Triglycerides  ?Date Value Ref Range Status  ?04/12/2021 101 0 - 149 mg/dL Final  ? ?  ?  ?  Passed - Patient is not pregnant  ?  ?  Passed - Valid encounter within last 12 months  ?  Recent Outpatient Visits   ?      ? 2 weeks ago Essential (primary) hypertension  ? [facility] Glean Hess, MD  ? 6 months ago Acute left-sided low back pain with left-sided sciatica  ? [facility] Glean Hess, MD  ? 7 months ago Essential (primary) hypertension  ? [facility] Glean Hess, MD  ? 1 year ago Encounter for screening mammogram for breast cancer  ? [facility] Glean Hess, MD  ? 1 year ago Prediabetes  ? [facility] Glean Hess, MD  ?  ?  ?Future Appointments   ?        ? In 5 months Army Melia Jesse Sans, MD [facility], [place]  ?  ? ?  ?  ?  ? ?

## 2021-11-16 DIAGNOSIS — M25562 Pain in left knee: Secondary | ICD-10-CM | POA: Diagnosis not present

## 2021-11-16 DIAGNOSIS — M5442 Lumbago with sciatica, left side: Secondary | ICD-10-CM | POA: Diagnosis not present

## 2021-11-16 DIAGNOSIS — R262 Difficulty in walking, not elsewhere classified: Secondary | ICD-10-CM | POA: Diagnosis not present

## 2021-11-16 DIAGNOSIS — M5416 Radiculopathy, lumbar region: Secondary | ICD-10-CM | POA: Diagnosis not present

## 2021-11-16 DIAGNOSIS — M25552 Pain in left hip: Secondary | ICD-10-CM | POA: Diagnosis not present

## 2021-11-20 DIAGNOSIS — M25552 Pain in left hip: Secondary | ICD-10-CM | POA: Diagnosis not present

## 2021-11-20 DIAGNOSIS — M5416 Radiculopathy, lumbar region: Secondary | ICD-10-CM | POA: Diagnosis not present

## 2021-11-20 DIAGNOSIS — R262 Difficulty in walking, not elsewhere classified: Secondary | ICD-10-CM | POA: Diagnosis not present

## 2021-11-20 DIAGNOSIS — M5442 Lumbago with sciatica, left side: Secondary | ICD-10-CM | POA: Diagnosis not present

## 2021-11-20 DIAGNOSIS — M25562 Pain in left knee: Secondary | ICD-10-CM | POA: Diagnosis not present

## 2021-11-22 DIAGNOSIS — M5416 Radiculopathy, lumbar region: Secondary | ICD-10-CM | POA: Diagnosis not present

## 2021-11-22 DIAGNOSIS — M5442 Lumbago with sciatica, left side: Secondary | ICD-10-CM | POA: Diagnosis not present

## 2021-11-22 DIAGNOSIS — R262 Difficulty in walking, not elsewhere classified: Secondary | ICD-10-CM | POA: Diagnosis not present

## 2021-11-22 DIAGNOSIS — M25552 Pain in left hip: Secondary | ICD-10-CM | POA: Diagnosis not present

## 2021-11-22 DIAGNOSIS — M25562 Pain in left knee: Secondary | ICD-10-CM | POA: Diagnosis not present

## 2021-11-27 ENCOUNTER — Other Ambulatory Visit: Payer: Self-pay | Admitting: Internal Medicine

## 2021-11-27 DIAGNOSIS — M5416 Radiculopathy, lumbar region: Secondary | ICD-10-CM | POA: Diagnosis not present

## 2021-11-27 DIAGNOSIS — M25552 Pain in left hip: Secondary | ICD-10-CM | POA: Diagnosis not present

## 2021-11-27 DIAGNOSIS — M5442 Lumbago with sciatica, left side: Secondary | ICD-10-CM | POA: Diagnosis not present

## 2021-11-27 DIAGNOSIS — I872 Venous insufficiency (chronic) (peripheral): Secondary | ICD-10-CM

## 2021-11-27 DIAGNOSIS — R262 Difficulty in walking, not elsewhere classified: Secondary | ICD-10-CM | POA: Diagnosis not present

## 2021-11-27 DIAGNOSIS — M25562 Pain in left knee: Secondary | ICD-10-CM | POA: Diagnosis not present

## 2021-11-28 DIAGNOSIS — I6523 Occlusion and stenosis of bilateral carotid arteries: Secondary | ICD-10-CM | POA: Diagnosis not present

## 2021-11-28 DIAGNOSIS — R7303 Prediabetes: Secondary | ICD-10-CM | POA: Diagnosis not present

## 2021-11-28 DIAGNOSIS — I10 Essential (primary) hypertension: Secondary | ICD-10-CM | POA: Diagnosis not present

## 2021-11-28 DIAGNOSIS — H4089 Other specified glaucoma: Secondary | ICD-10-CM | POA: Diagnosis not present

## 2021-11-28 DIAGNOSIS — E782 Mixed hyperlipidemia: Secondary | ICD-10-CM | POA: Diagnosis not present

## 2021-11-28 DIAGNOSIS — I671 Cerebral aneurysm, nonruptured: Secondary | ICD-10-CM | POA: Diagnosis not present

## 2021-11-28 NOTE — Telephone Encounter (Signed)
Requested Prescriptions  ?Pending Prescriptions Disp Refills  ?? hydrochlorothiazide (HYDRODIURIL) 25 MG tablet [Pharmacy Med Name: HYDROCHLOROTHIAZIDE 25 MG TAB] 45 tablet 1  ?  Sig: TAKE 1/2 TABLET BY MOUTH EVERY DAY  ?  ? Cardiovascular: Diuretics - Thiazide Failed - 11/27/2021  9:32 AM  ?  ?  Failed - Cr in normal range and within 180 days  ?  Creatinine, Ser  ?Date Value Ref Range Status  ?05/13/2021 0.69 0.44 - 1.00 mg/dL Final  ?   ?  ?  Failed - K in normal range and within 180 days  ?  Potassium  ?Date Value Ref Range Status  ?05/13/2021 3.1 (L) 3.5 - 5.1 mmol/L Final  ?   ?  ?  Failed - Na in normal range and within 180 days  ?  Sodium  ?Date Value Ref Range Status  ?05/13/2021 133 (L) 135 - 145 mmol/L Final  ?04/12/2021 138 134 - 144 mmol/L Final  ?   ?  ?  Passed - Last BP in normal range  ?  BP Readings from Last 1 Encounters:  ?10/30/21 138/78  ?   ?  ?  Passed - Valid encounter within last 6 months  ?  Recent Outpatient Visits   ?      ? 4 weeks ago Essential (primary) hypertension  ? [facility] Glean Hess, MD  ? 7 months ago Acute left-sided low back pain with left-sided sciatica  ? [facility] Glean Hess, MD  ? 8 months ago Essential (primary) hypertension  ? [facility] Glean Hess, MD  ? 1 year ago Encounter for screening mammogram for breast cancer  ? [facility] Glean Hess, MD  ? 1 year ago Prediabetes  ? [facility] Glean Hess, MD  ?  ?  ?Future Appointments   ?        ? In 5 months Army Melia Jesse Sans, MD [facility], [place]  ?  ? ?  ?  ?  ? ? ?

## 2021-11-29 DIAGNOSIS — R262 Difficulty in walking, not elsewhere classified: Secondary | ICD-10-CM | POA: Diagnosis not present

## 2021-11-29 DIAGNOSIS — M5442 Lumbago with sciatica, left side: Secondary | ICD-10-CM | POA: Diagnosis not present

## 2021-11-29 DIAGNOSIS — M5416 Radiculopathy, lumbar region: Secondary | ICD-10-CM | POA: Diagnosis not present

## 2021-11-29 DIAGNOSIS — M25552 Pain in left hip: Secondary | ICD-10-CM | POA: Diagnosis not present

## 2021-11-29 DIAGNOSIS — M25562 Pain in left knee: Secondary | ICD-10-CM | POA: Diagnosis not present

## 2021-12-03 ENCOUNTER — Encounter (INDEPENDENT_AMBULATORY_CARE_PROVIDER_SITE_OTHER): Payer: Medicare Other | Admitting: Vascular Surgery

## 2021-12-05 DIAGNOSIS — M25552 Pain in left hip: Secondary | ICD-10-CM | POA: Diagnosis not present

## 2021-12-05 DIAGNOSIS — M25562 Pain in left knee: Secondary | ICD-10-CM | POA: Diagnosis not present

## 2021-12-05 DIAGNOSIS — M5442 Lumbago with sciatica, left side: Secondary | ICD-10-CM | POA: Diagnosis not present

## 2021-12-05 DIAGNOSIS — M5416 Radiculopathy, lumbar region: Secondary | ICD-10-CM | POA: Diagnosis not present

## 2021-12-05 DIAGNOSIS — R262 Difficulty in walking, not elsewhere classified: Secondary | ICD-10-CM | POA: Diagnosis not present

## 2021-12-10 DIAGNOSIS — M25562 Pain in left knee: Secondary | ICD-10-CM | POA: Diagnosis not present

## 2021-12-10 DIAGNOSIS — M5416 Radiculopathy, lumbar region: Secondary | ICD-10-CM | POA: Diagnosis not present

## 2021-12-10 DIAGNOSIS — M5442 Lumbago with sciatica, left side: Secondary | ICD-10-CM | POA: Diagnosis not present

## 2021-12-10 DIAGNOSIS — R262 Difficulty in walking, not elsewhere classified: Secondary | ICD-10-CM | POA: Diagnosis not present

## 2021-12-10 DIAGNOSIS — M25552 Pain in left hip: Secondary | ICD-10-CM | POA: Diagnosis not present

## 2021-12-13 DIAGNOSIS — M25562 Pain in left knee: Secondary | ICD-10-CM | POA: Diagnosis not present

## 2021-12-13 DIAGNOSIS — M5416 Radiculopathy, lumbar region: Secondary | ICD-10-CM | POA: Diagnosis not present

## 2021-12-13 DIAGNOSIS — M5442 Lumbago with sciatica, left side: Secondary | ICD-10-CM | POA: Diagnosis not present

## 2021-12-13 DIAGNOSIS — M25552 Pain in left hip: Secondary | ICD-10-CM | POA: Diagnosis not present

## 2021-12-13 DIAGNOSIS — R262 Difficulty in walking, not elsewhere classified: Secondary | ICD-10-CM | POA: Diagnosis not present

## 2021-12-17 DIAGNOSIS — R262 Difficulty in walking, not elsewhere classified: Secondary | ICD-10-CM | POA: Diagnosis not present

## 2021-12-17 DIAGNOSIS — M5416 Radiculopathy, lumbar region: Secondary | ICD-10-CM | POA: Diagnosis not present

## 2021-12-17 DIAGNOSIS — M25552 Pain in left hip: Secondary | ICD-10-CM | POA: Diagnosis not present

## 2021-12-17 DIAGNOSIS — M25562 Pain in left knee: Secondary | ICD-10-CM | POA: Diagnosis not present

## 2021-12-17 DIAGNOSIS — M5442 Lumbago with sciatica, left side: Secondary | ICD-10-CM | POA: Diagnosis not present

## 2021-12-19 DIAGNOSIS — I6523 Occlusion and stenosis of bilateral carotid arteries: Secondary | ICD-10-CM | POA: Diagnosis not present

## 2021-12-19 DIAGNOSIS — E785 Hyperlipidemia, unspecified: Secondary | ICD-10-CM | POA: Diagnosis not present

## 2021-12-19 DIAGNOSIS — I10 Essential (primary) hypertension: Secondary | ICD-10-CM | POA: Diagnosis not present

## 2021-12-19 DIAGNOSIS — I6529 Occlusion and stenosis of unspecified carotid artery: Secondary | ICD-10-CM | POA: Diagnosis not present

## 2021-12-19 DIAGNOSIS — R7303 Prediabetes: Secondary | ICD-10-CM | POA: Diagnosis not present

## 2021-12-27 DIAGNOSIS — M25562 Pain in left knee: Secondary | ICD-10-CM | POA: Diagnosis not present

## 2021-12-27 DIAGNOSIS — R262 Difficulty in walking, not elsewhere classified: Secondary | ICD-10-CM | POA: Diagnosis not present

## 2021-12-27 DIAGNOSIS — M25552 Pain in left hip: Secondary | ICD-10-CM | POA: Diagnosis not present

## 2021-12-27 DIAGNOSIS — M5442 Lumbago with sciatica, left side: Secondary | ICD-10-CM | POA: Diagnosis not present

## 2021-12-27 DIAGNOSIS — M5416 Radiculopathy, lumbar region: Secondary | ICD-10-CM | POA: Diagnosis not present

## 2021-12-28 DIAGNOSIS — M5416 Radiculopathy, lumbar region: Secondary | ICD-10-CM | POA: Diagnosis not present

## 2021-12-28 DIAGNOSIS — H401131 Primary open-angle glaucoma, bilateral, mild stage: Secondary | ICD-10-CM | POA: Diagnosis not present

## 2021-12-28 DIAGNOSIS — R262 Difficulty in walking, not elsewhere classified: Secondary | ICD-10-CM | POA: Diagnosis not present

## 2021-12-28 DIAGNOSIS — M5442 Lumbago with sciatica, left side: Secondary | ICD-10-CM | POA: Diagnosis not present

## 2021-12-28 DIAGNOSIS — M25552 Pain in left hip: Secondary | ICD-10-CM | POA: Diagnosis not present

## 2021-12-28 DIAGNOSIS — M25562 Pain in left knee: Secondary | ICD-10-CM | POA: Diagnosis not present

## 2022-01-02 DIAGNOSIS — M5416 Radiculopathy, lumbar region: Secondary | ICD-10-CM | POA: Diagnosis not present

## 2022-01-02 DIAGNOSIS — M25552 Pain in left hip: Secondary | ICD-10-CM | POA: Diagnosis not present

## 2022-01-02 DIAGNOSIS — R262 Difficulty in walking, not elsewhere classified: Secondary | ICD-10-CM | POA: Diagnosis not present

## 2022-01-02 DIAGNOSIS — M25562 Pain in left knee: Secondary | ICD-10-CM | POA: Diagnosis not present

## 2022-01-02 DIAGNOSIS — M5442 Lumbago with sciatica, left side: Secondary | ICD-10-CM | POA: Diagnosis not present

## 2022-01-04 DIAGNOSIS — M5442 Lumbago with sciatica, left side: Secondary | ICD-10-CM | POA: Diagnosis not present

## 2022-01-04 DIAGNOSIS — M25562 Pain in left knee: Secondary | ICD-10-CM | POA: Diagnosis not present

## 2022-01-04 DIAGNOSIS — R262 Difficulty in walking, not elsewhere classified: Secondary | ICD-10-CM | POA: Diagnosis not present

## 2022-01-04 DIAGNOSIS — M25552 Pain in left hip: Secondary | ICD-10-CM | POA: Diagnosis not present

## 2022-01-04 DIAGNOSIS — M5416 Radiculopathy, lumbar region: Secondary | ICD-10-CM | POA: Diagnosis not present

## 2022-01-08 DIAGNOSIS — M25562 Pain in left knee: Secondary | ICD-10-CM | POA: Diagnosis not present

## 2022-01-08 DIAGNOSIS — M5416 Radiculopathy, lumbar region: Secondary | ICD-10-CM | POA: Diagnosis not present

## 2022-01-08 DIAGNOSIS — R262 Difficulty in walking, not elsewhere classified: Secondary | ICD-10-CM | POA: Diagnosis not present

## 2022-01-08 DIAGNOSIS — M5442 Lumbago with sciatica, left side: Secondary | ICD-10-CM | POA: Diagnosis not present

## 2022-01-08 DIAGNOSIS — M25552 Pain in left hip: Secondary | ICD-10-CM | POA: Diagnosis not present

## 2022-01-11 DIAGNOSIS — M25552 Pain in left hip: Secondary | ICD-10-CM | POA: Diagnosis not present

## 2022-01-11 DIAGNOSIS — M5416 Radiculopathy, lumbar region: Secondary | ICD-10-CM | POA: Diagnosis not present

## 2022-01-11 DIAGNOSIS — M5442 Lumbago with sciatica, left side: Secondary | ICD-10-CM | POA: Diagnosis not present

## 2022-01-11 DIAGNOSIS — M25562 Pain in left knee: Secondary | ICD-10-CM | POA: Diagnosis not present

## 2022-01-11 DIAGNOSIS — R262 Difficulty in walking, not elsewhere classified: Secondary | ICD-10-CM | POA: Diagnosis not present

## 2022-01-15 DIAGNOSIS — R262 Difficulty in walking, not elsewhere classified: Secondary | ICD-10-CM | POA: Diagnosis not present

## 2022-01-15 DIAGNOSIS — M25562 Pain in left knee: Secondary | ICD-10-CM | POA: Diagnosis not present

## 2022-01-15 DIAGNOSIS — M5416 Radiculopathy, lumbar region: Secondary | ICD-10-CM | POA: Diagnosis not present

## 2022-01-15 DIAGNOSIS — M25552 Pain in left hip: Secondary | ICD-10-CM | POA: Diagnosis not present

## 2022-01-15 DIAGNOSIS — M5442 Lumbago with sciatica, left side: Secondary | ICD-10-CM | POA: Diagnosis not present

## 2022-01-21 DIAGNOSIS — M5416 Radiculopathy, lumbar region: Secondary | ICD-10-CM | POA: Diagnosis not present

## 2022-01-21 DIAGNOSIS — M5442 Lumbago with sciatica, left side: Secondary | ICD-10-CM | POA: Diagnosis not present

## 2022-01-21 DIAGNOSIS — M25562 Pain in left knee: Secondary | ICD-10-CM | POA: Diagnosis not present

## 2022-01-21 DIAGNOSIS — M25552 Pain in left hip: Secondary | ICD-10-CM | POA: Diagnosis not present

## 2022-01-21 DIAGNOSIS — R262 Difficulty in walking, not elsewhere classified: Secondary | ICD-10-CM | POA: Diagnosis not present

## 2022-01-24 DIAGNOSIS — M5442 Lumbago with sciatica, left side: Secondary | ICD-10-CM | POA: Diagnosis not present

## 2022-01-24 DIAGNOSIS — R262 Difficulty in walking, not elsewhere classified: Secondary | ICD-10-CM | POA: Diagnosis not present

## 2022-01-24 DIAGNOSIS — M25562 Pain in left knee: Secondary | ICD-10-CM | POA: Diagnosis not present

## 2022-01-24 DIAGNOSIS — M5416 Radiculopathy, lumbar region: Secondary | ICD-10-CM | POA: Diagnosis not present

## 2022-01-24 DIAGNOSIS — M25552 Pain in left hip: Secondary | ICD-10-CM | POA: Diagnosis not present

## 2022-01-29 DIAGNOSIS — H4089 Other specified glaucoma: Secondary | ICD-10-CM | POA: Diagnosis not present

## 2022-01-29 DIAGNOSIS — E782 Mixed hyperlipidemia: Secondary | ICD-10-CM | POA: Diagnosis not present

## 2022-01-29 DIAGNOSIS — I6523 Occlusion and stenosis of bilateral carotid arteries: Secondary | ICD-10-CM | POA: Diagnosis not present

## 2022-01-29 DIAGNOSIS — I671 Cerebral aneurysm, nonruptured: Secondary | ICD-10-CM | POA: Diagnosis not present

## 2022-01-29 DIAGNOSIS — I10 Essential (primary) hypertension: Secondary | ICD-10-CM | POA: Diagnosis not present

## 2022-01-29 DIAGNOSIS — R7303 Prediabetes: Secondary | ICD-10-CM | POA: Diagnosis not present

## 2022-02-01 DIAGNOSIS — R262 Difficulty in walking, not elsewhere classified: Secondary | ICD-10-CM | POA: Diagnosis not present

## 2022-02-01 DIAGNOSIS — M25552 Pain in left hip: Secondary | ICD-10-CM | POA: Diagnosis not present

## 2022-02-01 DIAGNOSIS — M5416 Radiculopathy, lumbar region: Secondary | ICD-10-CM | POA: Diagnosis not present

## 2022-02-01 DIAGNOSIS — M5442 Lumbago with sciatica, left side: Secondary | ICD-10-CM | POA: Diagnosis not present

## 2022-02-01 DIAGNOSIS — M25562 Pain in left knee: Secondary | ICD-10-CM | POA: Diagnosis not present

## 2022-02-04 DIAGNOSIS — M25552 Pain in left hip: Secondary | ICD-10-CM | POA: Diagnosis not present

## 2022-02-04 DIAGNOSIS — M25562 Pain in left knee: Secondary | ICD-10-CM | POA: Diagnosis not present

## 2022-02-04 DIAGNOSIS — R262 Difficulty in walking, not elsewhere classified: Secondary | ICD-10-CM | POA: Diagnosis not present

## 2022-02-04 DIAGNOSIS — M5416 Radiculopathy, lumbar region: Secondary | ICD-10-CM | POA: Diagnosis not present

## 2022-02-04 DIAGNOSIS — M5442 Lumbago with sciatica, left side: Secondary | ICD-10-CM | POA: Diagnosis not present

## 2022-02-08 DIAGNOSIS — M5416 Radiculopathy, lumbar region: Secondary | ICD-10-CM | POA: Diagnosis not present

## 2022-02-08 DIAGNOSIS — M25562 Pain in left knee: Secondary | ICD-10-CM | POA: Diagnosis not present

## 2022-02-08 DIAGNOSIS — M5442 Lumbago with sciatica, left side: Secondary | ICD-10-CM | POA: Diagnosis not present

## 2022-02-08 DIAGNOSIS — R262 Difficulty in walking, not elsewhere classified: Secondary | ICD-10-CM | POA: Diagnosis not present

## 2022-02-08 DIAGNOSIS — M25552 Pain in left hip: Secondary | ICD-10-CM | POA: Diagnosis not present

## 2022-02-15 DIAGNOSIS — M25562 Pain in left knee: Secondary | ICD-10-CM | POA: Diagnosis not present

## 2022-02-15 DIAGNOSIS — M25552 Pain in left hip: Secondary | ICD-10-CM | POA: Diagnosis not present

## 2022-02-15 DIAGNOSIS — M5442 Lumbago with sciatica, left side: Secondary | ICD-10-CM | POA: Diagnosis not present

## 2022-02-15 DIAGNOSIS — R262 Difficulty in walking, not elsewhere classified: Secondary | ICD-10-CM | POA: Diagnosis not present

## 2022-02-15 DIAGNOSIS — M5416 Radiculopathy, lumbar region: Secondary | ICD-10-CM | POA: Diagnosis not present

## 2022-02-18 DIAGNOSIS — M25562 Pain in left knee: Secondary | ICD-10-CM | POA: Diagnosis not present

## 2022-02-18 DIAGNOSIS — M5442 Lumbago with sciatica, left side: Secondary | ICD-10-CM | POA: Diagnosis not present

## 2022-02-18 DIAGNOSIS — M5416 Radiculopathy, lumbar region: Secondary | ICD-10-CM | POA: Diagnosis not present

## 2022-02-18 DIAGNOSIS — R262 Difficulty in walking, not elsewhere classified: Secondary | ICD-10-CM | POA: Diagnosis not present

## 2022-02-18 DIAGNOSIS — M25552 Pain in left hip: Secondary | ICD-10-CM | POA: Diagnosis not present

## 2022-02-20 DIAGNOSIS — M5442 Lumbago with sciatica, left side: Secondary | ICD-10-CM | POA: Diagnosis not present

## 2022-02-20 DIAGNOSIS — R262 Difficulty in walking, not elsewhere classified: Secondary | ICD-10-CM | POA: Diagnosis not present

## 2022-02-20 DIAGNOSIS — M25552 Pain in left hip: Secondary | ICD-10-CM | POA: Diagnosis not present

## 2022-02-20 DIAGNOSIS — M5416 Radiculopathy, lumbar region: Secondary | ICD-10-CM | POA: Diagnosis not present

## 2022-02-20 DIAGNOSIS — M25562 Pain in left knee: Secondary | ICD-10-CM | POA: Diagnosis not present

## 2022-02-26 DIAGNOSIS — M25552 Pain in left hip: Secondary | ICD-10-CM | POA: Diagnosis not present

## 2022-02-26 DIAGNOSIS — M25562 Pain in left knee: Secondary | ICD-10-CM | POA: Diagnosis not present

## 2022-02-26 DIAGNOSIS — R262 Difficulty in walking, not elsewhere classified: Secondary | ICD-10-CM | POA: Diagnosis not present

## 2022-02-26 DIAGNOSIS — M5442 Lumbago with sciatica, left side: Secondary | ICD-10-CM | POA: Diagnosis not present

## 2022-02-26 DIAGNOSIS — M5416 Radiculopathy, lumbar region: Secondary | ICD-10-CM | POA: Diagnosis not present

## 2022-03-01 DIAGNOSIS — M5416 Radiculopathy, lumbar region: Secondary | ICD-10-CM | POA: Diagnosis not present

## 2022-03-01 DIAGNOSIS — M25552 Pain in left hip: Secondary | ICD-10-CM | POA: Diagnosis not present

## 2022-03-01 DIAGNOSIS — M25562 Pain in left knee: Secondary | ICD-10-CM | POA: Diagnosis not present

## 2022-03-01 DIAGNOSIS — R262 Difficulty in walking, not elsewhere classified: Secondary | ICD-10-CM | POA: Diagnosis not present

## 2022-03-01 DIAGNOSIS — M5442 Lumbago with sciatica, left side: Secondary | ICD-10-CM | POA: Diagnosis not present

## 2022-03-04 DIAGNOSIS — R262 Difficulty in walking, not elsewhere classified: Secondary | ICD-10-CM | POA: Diagnosis not present

## 2022-03-04 DIAGNOSIS — M5416 Radiculopathy, lumbar region: Secondary | ICD-10-CM | POA: Diagnosis not present

## 2022-03-04 DIAGNOSIS — M25562 Pain in left knee: Secondary | ICD-10-CM | POA: Diagnosis not present

## 2022-03-04 DIAGNOSIS — M25552 Pain in left hip: Secondary | ICD-10-CM | POA: Diagnosis not present

## 2022-03-04 DIAGNOSIS — M5442 Lumbago with sciatica, left side: Secondary | ICD-10-CM | POA: Diagnosis not present

## 2022-03-06 DIAGNOSIS — M25552 Pain in left hip: Secondary | ICD-10-CM | POA: Diagnosis not present

## 2022-03-06 DIAGNOSIS — M5416 Radiculopathy, lumbar region: Secondary | ICD-10-CM | POA: Diagnosis not present

## 2022-03-06 DIAGNOSIS — R262 Difficulty in walking, not elsewhere classified: Secondary | ICD-10-CM | POA: Diagnosis not present

## 2022-03-06 DIAGNOSIS — M5442 Lumbago with sciatica, left side: Secondary | ICD-10-CM | POA: Diagnosis not present

## 2022-03-06 DIAGNOSIS — M25562 Pain in left knee: Secondary | ICD-10-CM | POA: Diagnosis not present

## 2022-03-07 DIAGNOSIS — R21 Rash and other nonspecific skin eruption: Secondary | ICD-10-CM | POA: Diagnosis not present

## 2022-03-24 IMAGING — US US CAROTID DUPLEX BILAT
1 series · 13 of 24 positions shown · non-contrast
Comparison: 12/29/2018

CLINICAL DATA: Carotid atherosclerosis, stenosis

EXAM:
BILATERAL CAROTID DUPLEX ULTRASOUND
TECHNIQUE: Gray scale imaging, color Doppler and duplex ultrasound were
performed of bilateral carotid and vertebral arteries in the neck.

[Series 1: us carotid duplex bilat · 0.06mm/px · 13 of 66 slices shown]
[im 1/66]
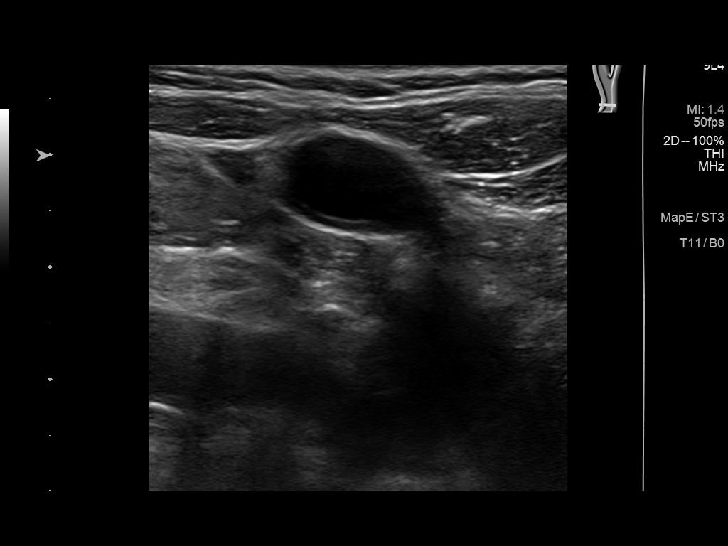
[im 6/66]
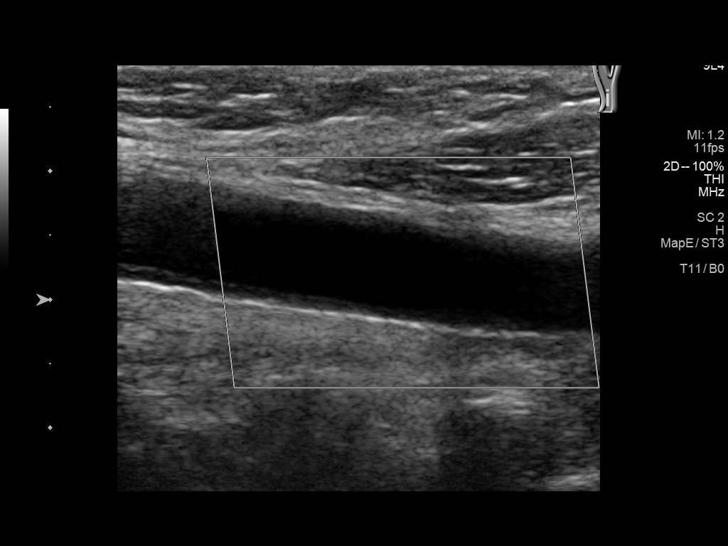
[im 12/66]
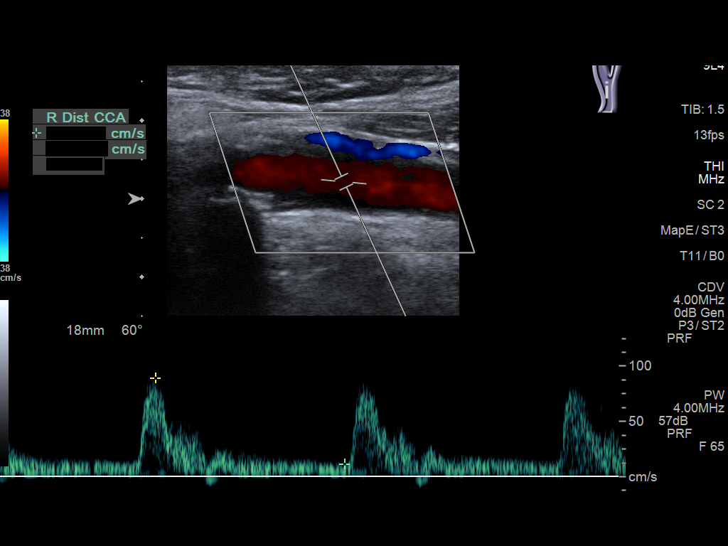
[im 17/66]
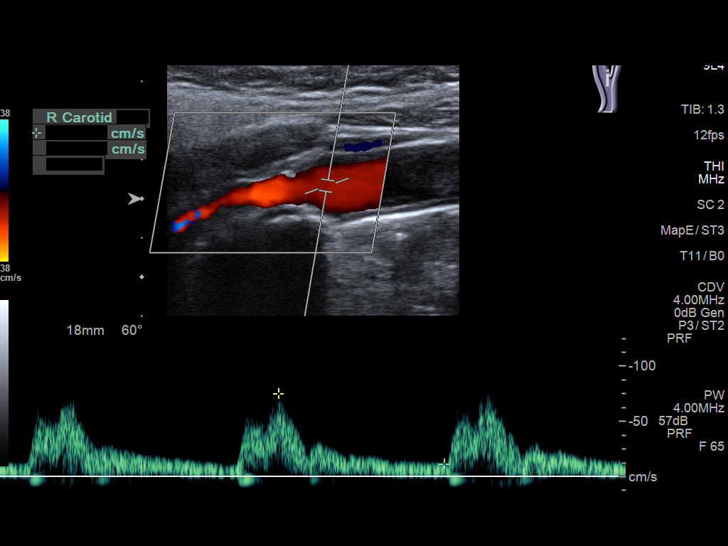
[im 23/66]
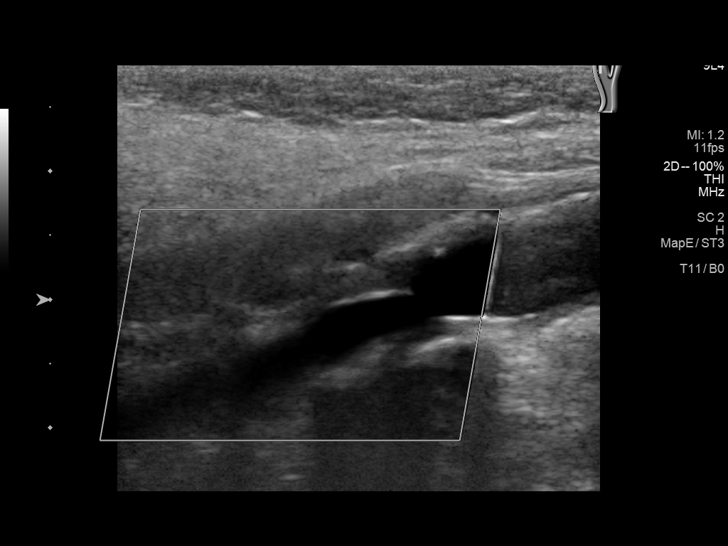
[im 29/66]
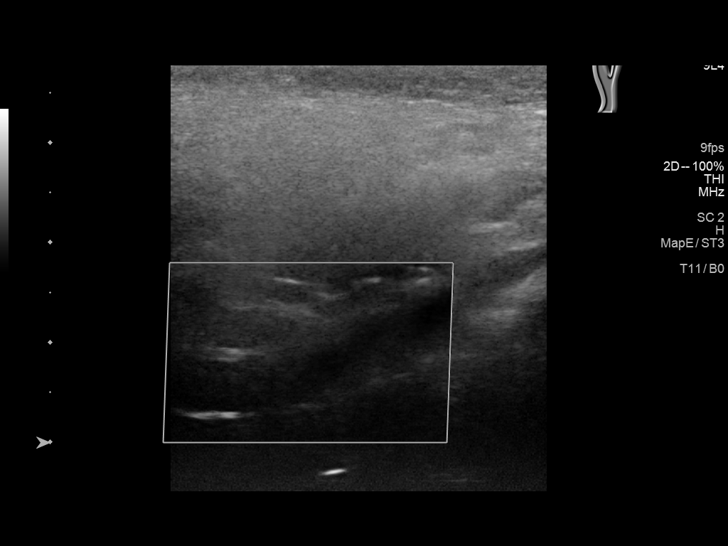
[im 34/66]
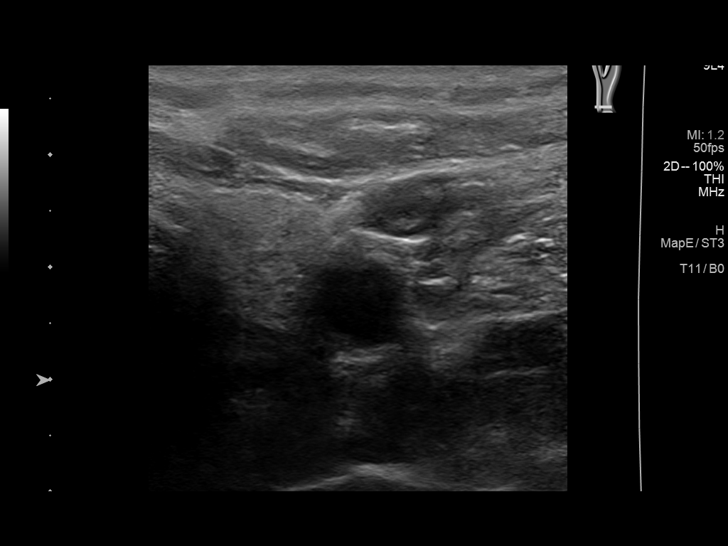
[im 37/66]
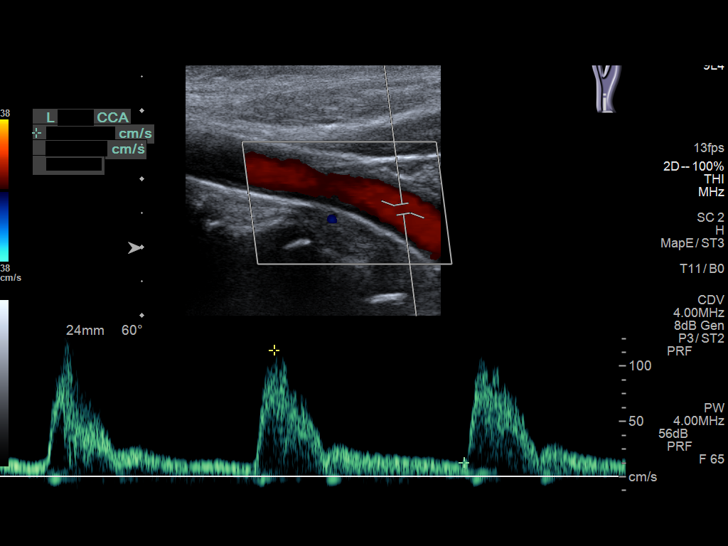
[im 43/66]
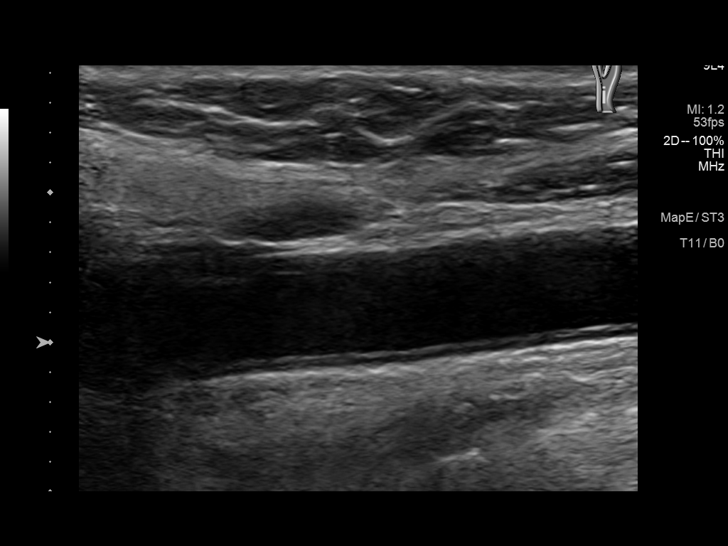
[im 49/66]
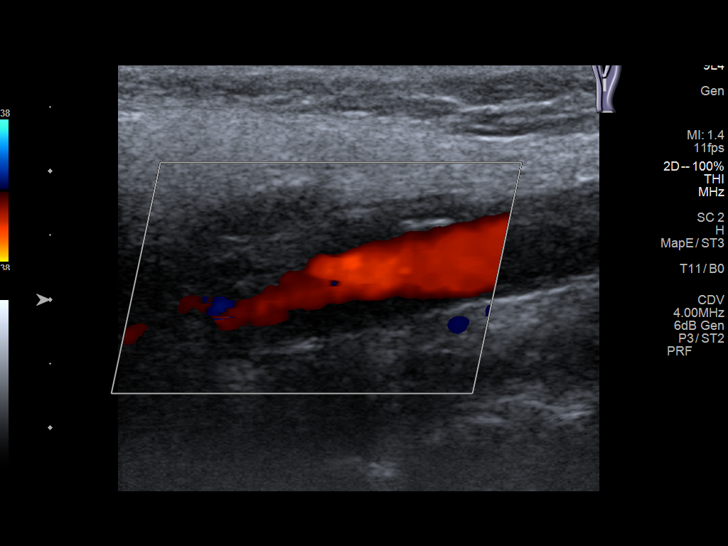
[im 54/66]
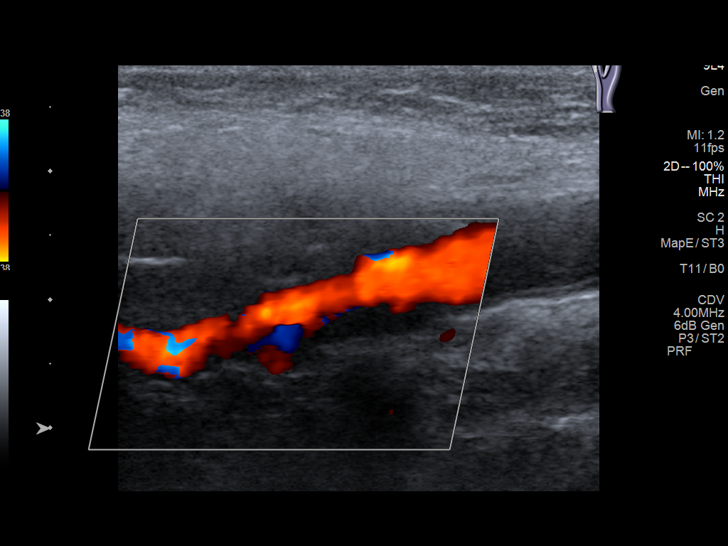
[im 60/66]
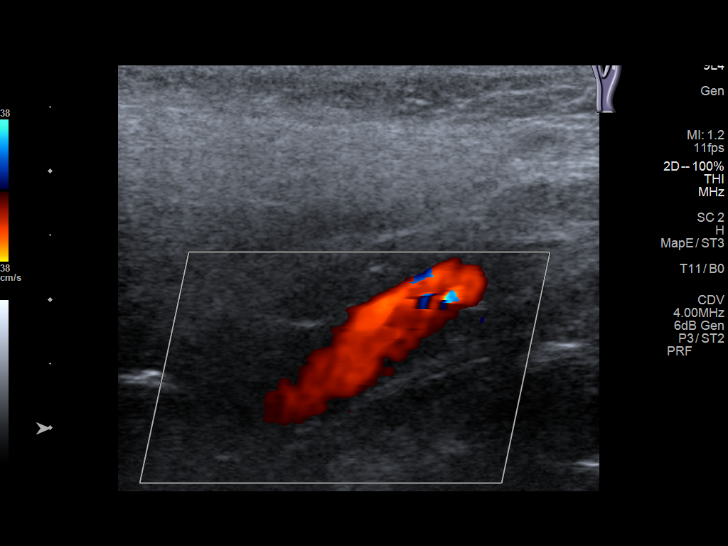
[im 66/66]
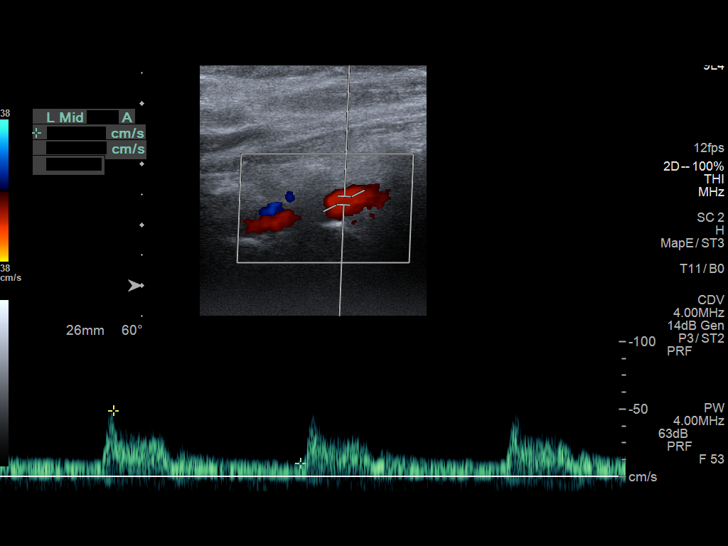

[13 of 24 positions shown; findings below may reference images not displayed]

FINDINGS: Criteria: Quantification of carotid stenosis is based on velocity
parameters that correlate the residual internal carotid diameter
with NASCET-based stenosis levels, using the diameter of the distal
internal carotid lumen as the denominator for stenosis measurement.

The following velocity measurements were obtained:

RIGHT

ICA: 236/50 cm/sec

CCA: 103/12 cm/sec

SYSTOLIC ICA/CCA RATIO:

ECA: 160 cm/sec

LEFT

ICA: 234/45 cm/sec

CCA: 80/12 cm/sec

SYSTOLIC ICA/CCA RATIO:

ECA: 141 cm/sec

RIGHT CAROTID ARTERY: Similar moderate heterogeneous bifurcation
atherosclerosis. There is elevation in the right ICA velocity
measuring 236/59 centimeters/second proximally. Despite this, there
is no significant turbulent flow or spectral broadening. Right ICA
stenosis estimated at 50-69%.

RIGHT VERTEBRAL ARTERY:  Normal antegrade flow

LEFT CAROTID ARTERY: Similar moderate heterogeneous mixed
echogenicity carotid bifurcation atherosclerosis. Proximal ICA
significant turbulent flow or spectral broadening. Left ICA stenosis
also estimated at 50-69%.

LEFT VERTEBRAL ARTERY:  Normal antegrade flow
IMPRESSION: Moderate bilateral carotid atherosclerosis with mild ICA velocity
elevations bilaterally without significant turbulent flow or
spectral broadening. Because of this, bilateral ICA moderate
stenoses still estimated at 50-69%.

Patent normal antegrade vertebral flow bilaterally

## 2022-05-02 ENCOUNTER — Other Ambulatory Visit: Payer: Self-pay | Admitting: Internal Medicine

## 2022-05-02 ENCOUNTER — Other Ambulatory Visit: Payer: Self-pay

## 2022-05-02 ENCOUNTER — Encounter: Payer: Medicare Other | Admitting: Internal Medicine

## 2022-05-02 DIAGNOSIS — I10 Essential (primary) hypertension: Secondary | ICD-10-CM

## 2022-05-02 NOTE — Telephone Encounter (Signed)
Requested medication (s) are due for refill today: yes  Requested medication (s) are on the active medication list:yes  Last refill:  10/30/21  Future visit scheduled: no  Notes to clinic:  Unable to refill per protocol, appointment needed. Patient needs medicare wellness physical, [facility] unable to schedule, please advise.     Requested Prescriptions  Pending Prescriptions Disp Refills   lisinopril-hydrochlorothiazide (ZESTORETIC) 10-12.5 MG tablet [Pharmacy Med Name: LISINOPRIL-HCTZ 10-12.5 MG TAB] 90 tablet 1    Sig: TAKE 1 TABLET BY MOUTH EVERY DAY     Cardiovascular:  ACEI + Diuretic Combos Failed - 05/02/2022  2:20 AM      Failed - Na in normal range and within 180 days    Sodium  Date Value Ref Range Status  05/13/2021 133 (L) 135 - 145 mmol/L Final  04/12/2021 138 134 - 144 mmol/L Final         Failed - K in normal range and within 180 days    Potassium  Date Value Ref Range Status  05/13/2021 3.1 (L) 3.5 - 5.1 mmol/L Final         Failed - Cr in normal range and within 180 days    Creatinine, Ser  Date Value Ref Range Status  05/13/2021 0.69 0.44 - 1.00 mg/dL Final         Failed - eGFR is 30 or above and within 180 days    GFR calc Af Amer  Date Value Ref Range Status  11/25/2019 97 >59 mL/min/1.73 Final   GFR, Estimated  Date Value Ref Range Status  05/13/2021 >60 >60 mL/min Final    Comment:    (NOTE) Calculated using the CKD-EPI Creatinine Equation (2021)    eGFR  Date Value Ref Range Status  04/12/2021 77 >59 mL/min/1.73 Final         Failed - Valid encounter within last 6 months    Recent Outpatient Visits           6 months ago Essential (primary) hypertension   [facility], Laura H, MD   1 year ago Acute left-sided low back pain with left-sided sciatica   [place] [facility], Laura H, MD   1 year ago Essential (primary)  hypertension   [place] [facility], Laura H, MD   1 year ago Encounter for screening mammogram for breast cancer   [place] [facility], Laura H, MD   1 year ago Prediabetes   [place] [facility], Laura H, MD              Passed - Patient is not pregnant      Passed - Last BP in normal range    BP Readings from Last 1 Encounters:  10/30/21 138/78           

## 2022-05-13 ENCOUNTER — Other Ambulatory Visit: Payer: Self-pay | Admitting: Internal Medicine

## 2022-05-13 DIAGNOSIS — I6381 Other cerebral infarction due to occlusion or stenosis of small artery: Secondary | ICD-10-CM

## 2022-05-13 DIAGNOSIS — E782 Mixed hyperlipidemia: Secondary | ICD-10-CM

## 2022-05-13 NOTE — Telephone Encounter (Signed)
Requested medications are due for refill today.  yes  Requested medications are on the active medications list.  yes  Last refill. 11/15/2021 #90 1 rf  Future visit scheduled.   no  Notes to clinic.  Labs are expired    Requested Prescriptions  Pending Prescriptions Disp Refills   atorvastatin (LIPITOR) 20 MG tablet [Pharmacy Med Name: ATORVASTATIN 20 MG TABLET] 90 tablet 1    Sig: TAKE 1 TABLET BY MOUTH DAILY AT 6 PM.     Cardiovascular:  Antilipid - Statins Failed - 05/13/2022  2:20 AM      Failed - Lipid Panel in normal range within the last 12 months    Cholesterol, Total  Date Value Ref Range Status  04/12/2021 226 (H) 100 - 199 mg/dL Final   LDL Chol Calc (NIH)  Date Value Ref Range Status  04/12/2021 156 (H) 0 - 99 mg/dL Final   HDL  Date Value Ref Range Status  04/12/2021 52 >39 mg/dL Final   Triglycerides  Date Value Ref Range Status  04/12/2021 101 0 - 149 mg/dL Final         Passed - Patient is not pregnant      Passed - Valid encounter within last 12 months    Recent Outpatient Visits           6 months ago Essential (primary) hypertension   [place] [facility], Jesse Sans, MD   1 year ago Acute left-sided low back pain with left-sided sciatica   [place] [facility], Jesse Sans, MD   1 year ago Essential (primary) hypertension   [place] [facility], Jesse Sans, MD   1 year ago Encounter for screening mammogram for breast cancer   [place] [facility], Jesse Sans, MD   1 year ago [place] [facility] at [facility], Jesse Sans, MD

## 2022-05-28 ENCOUNTER — Other Ambulatory Visit: Payer: Self-pay | Admitting: Internal Medicine

## 2022-05-28 DIAGNOSIS — I10 Essential (primary) hypertension: Secondary | ICD-10-CM

## 2022-05-28 DIAGNOSIS — R7303 Prediabetes: Secondary | ICD-10-CM | POA: Diagnosis not present

## 2022-05-28 DIAGNOSIS — H4089 Other specified glaucoma: Secondary | ICD-10-CM | POA: Diagnosis not present

## 2022-05-28 DIAGNOSIS — Z23 Encounter for immunization: Secondary | ICD-10-CM | POA: Diagnosis not present

## 2022-05-28 DIAGNOSIS — E782 Mixed hyperlipidemia: Secondary | ICD-10-CM | POA: Diagnosis not present

## 2022-05-28 DIAGNOSIS — R2681 Unsteadiness on feet: Secondary | ICD-10-CM | POA: Diagnosis not present

## 2022-05-28 DIAGNOSIS — I671 Cerebral aneurysm, nonruptured: Secondary | ICD-10-CM | POA: Diagnosis not present

## 2022-05-29 NOTE — Telephone Encounter (Signed)
Requested medication (s) are due for refill today:yes  Requested medication (s) are on the active medication list: yes  Last refill:  05/02/22  Future visit scheduled: no  Notes to clinic:  Unable to refill per protocol, courtesy refill already given, routing for provider approval.  Pharmacy request 90 supply.     Requested Prescriptions  Pending Prescriptions Disp Refills   lisinopril-hydrochlorothiazide (ZESTORETIC) 10-12.5 MG tablet [Pharmacy Med Name: LISINOPRIL-HCTZ 10-12.5 MG TAB] 90 tablet 1    Sig: TAKE 1 TABLET BY MOUTH EVERY DAY     Cardiovascular:  ACEI + Diuretic Combos Failed - 05/28/2022  8:34 AM      Failed - Na in normal range and within 180 days    Sodium  Date Value Ref Range Status  05/13/2021 133 (L) 135 - 145 mmol/L Final  04/12/2021 138 134 - 144 mmol/L Final         Failed - K in normal range and within 180 days    Potassium  Date Value Ref Range Status  05/13/2021 3.1 (L) 3.5 - 5.1 mmol/L Final         Failed - Cr in normal range and within 180 days    Creatinine, Ser  Date Value Ref Range Status  05/13/2021 0.69 0.44 - 1.00 mg/dL Final         Failed - eGFR is 30 or above and within 180 days    GFR calc Af Amer  Date Value Ref Range Status  11/25/2019 97 >59 mL/min/1.73 Final   GFR, Estimated  Date Value Ref Range Status  05/13/2021 >60 >60 mL/min Final    Comment:    (NOTE) Calculated using the CKD-EPI Creatinine Equation (2021)    eGFR  Date Value Ref Range Status  04/12/2021 77 >59 mL/min/1.73 Final         Failed - Valid encounter within last 6 months    Recent Outpatient Visits           7 months ago Essential (primary) hypertension   [place] [facility], Laura H, MD   1 year ago Acute left-sided low back pain with left-sided sciatica   [place] [facility], Laura H, MD   1 year ago Essential (primary) hypertension    [place] [facility], Laura H, MD   1 year ago Encounter for screening mammogram for breast cancer   [place] [facility], Laura H, MD   1 year ago Prediabetes   [place] [facility], Laura H, MD              Passed - Patient is not pregnant      Passed - Last BP in normal range    BP Readings from Last 1 Encounters:  10/30/21 138/78           

## 2022-05-30 ENCOUNTER — Other Ambulatory Visit: Payer: Self-pay | Admitting: Internal Medicine

## 2022-05-30 DIAGNOSIS — I6381 Other cerebral infarction due to occlusion or stenosis of small artery: Secondary | ICD-10-CM

## 2022-05-30 DIAGNOSIS — E782 Mixed hyperlipidemia: Secondary | ICD-10-CM

## 2022-05-30 NOTE — Telephone Encounter (Signed)
Requested medication (s) are due for refill today: yes  Requested medication (s) are on the active medication list: yes  Last refill:  05/14/22  Future visit scheduled: yes  Notes to clinic:  Unable to refill per protocol, courtesy refill already given, routing for provider approval.  Last refill 05/14/22 for 30.     Requested Prescriptions  Pending Prescriptions Disp Refills   atorvastatin (LIPITOR) 20 MG tablet [Pharmacy Med Name: ATORVASTATIN 20 MG TABLET] 30 tablet 0    Sig: TAKE 1 TABLET BY MOUTH DAILY AT 6 PM.     Cardiovascular:  Antilipid - Statins Failed - 05/30/2022  1:27 AM      Failed - Lipid Panel in normal range within the last 12 months    Cholesterol, Total  Date Value Ref Range Status  04/12/2021 226 (H) 100 - 199 mg/dL Final   LDL Chol Calc (NIH)  Date Value Ref Range Status  04/12/2021 156 (H) 0 - 99 mg/dL Final   HDL  Date Value Ref Range Status  04/12/2021 52 >39 mg/dL Final   Triglycerides  Date Value Ref Range Status  04/12/2021 101 0 - 149 mg/dL Final         Passed - Patient is not pregnant      Passed - Valid encounter within last 12 months    Recent Outpatient Visits           7 months ago Essential (primary) hypertension   [place] [facility] at [facility], Jesse Sans, MD   1 year ago Acute left-sided low back pain with left-sided sciatica   [place] [facility] at [facility], Jesse Sans, MD   1 year ago Essential (primary) hypertension   [place] [facility] at [facility], Jesse Sans, MD   1 year ago Encounter for screening mammogram for breast cancer   [place] [facility] at [facility], Jesse Sans, MD   1 year ago [place] and Sports Medicine at [facility], Jesse Sans, MD

## 2022-06-03 ENCOUNTER — Encounter (INDEPENDENT_AMBULATORY_CARE_PROVIDER_SITE_OTHER): Payer: Self-pay

## 2022-06-11 DIAGNOSIS — H401131 Primary open-angle glaucoma, bilateral, mild stage: Secondary | ICD-10-CM | POA: Diagnosis not present

## 2022-06-13 ENCOUNTER — Other Ambulatory Visit: Payer: Self-pay | Admitting: Internal Medicine

## 2022-06-13 DIAGNOSIS — I6381 Other cerebral infarction due to occlusion or stenosis of small artery: Secondary | ICD-10-CM

## 2022-06-13 DIAGNOSIS — E782 Mixed hyperlipidemia: Secondary | ICD-10-CM

## 2022-06-13 NOTE — Telephone Encounter (Signed)
Unable to refill per protocol, Rx request is too soon, last refill 05/30/22 for 30. Will refuse duplicate request.  Requested Prescriptions  Pending Prescriptions Disp Refills   atorvastatin (LIPITOR) 20 MG tablet [Pharmacy Med Name: ATORVASTATIN 20 MG TABLET] 30 tablet 0    Sig: TAKE 1 TABLET BY MOUTH DAILY AT 6 PM.     Cardiovascular:  Antilipid - Statins Failed - 06/13/2022 11:16 AM      Failed - Lipid Panel in normal range within the last 12 months    Cholesterol, Total  Date Value Ref Range Status  04/12/2021 226 (H) 100 - 199 mg/dL Final   LDL Chol Calc (NIH)  Date Value Ref Range Status  04/12/2021 156 (H) 0 - 99 mg/dL Final   HDL  Date Value Ref Range Status  04/12/2021 52 >39 mg/dL Final   Triglycerides  Date Value Ref Range Status  04/12/2021 101 0 - 149 mg/dL Final         Passed - Patient is not pregnant      Passed - Valid encounter within last 12 months    Recent Outpatient Visits           7 months ago Essential (primary) hypertension   [place] [facility] at [facility], Jesse Sans, MD   1 year ago Acute left-sided low back pain with left-sided sciatica   [place] [facility] at [facility], Jesse Sans, MD   1 year ago Essential (primary) hypertension   [place] [facility] at [facility], Jesse Sans, MD   1 year ago Encounter for screening mammogram for breast cancer   [place] [facility] at [facility], Jesse Sans, MD   2 years ago [place] and [facility] Medicine at [facility], Jesse Sans, MD

## 2022-06-23 ENCOUNTER — Other Ambulatory Visit: Payer: Self-pay | Admitting: Internal Medicine

## 2022-06-23 DIAGNOSIS — I872 Venous insufficiency (chronic) (peripheral): Secondary | ICD-10-CM

## 2022-06-24 NOTE — Telephone Encounter (Signed)
Requested medication (s) are due for refill today: yes  Requested medication (s) are on the active medication list: yes  Last refill:  05/29/22  Future visit scheduled: no  Notes to clinic:  Unable to refill per protocol, courtesy refill already given, routing for provider approval.      Requested Prescriptions  Pending Prescriptions Disp Refills   hydrochlorothiazide (HYDRODIURIL) 25 MG tablet [Pharmacy Med Name: HYDROCHLOROTHIAZIDE 25 MG TAB] 45 tablet 1    Sig: TAKE 1/2 TABLET BY MOUTH EVERY DAY     Cardiovascular: Diuretics - Thiazide Failed - 06/23/2022  8:53 AM      Failed - Cr in normal range and within 180 days    Creatinine, Ser  Date Value Ref Range Status  05/13/2021 0.69 0.44 - 1.00 mg/dL Final         Failed - K in normal range and within 180 days    Potassium  Date Value Ref Range Status  05/13/2021 3.1 (L) 3.5 - 5.1 mmol/L Final         Failed - Na in normal range and within 180 days    Sodium  Date Value Ref Range Status  05/13/2021 133 (L) 135 - 145 mmol/L Final  04/12/2021 138 134 - 144 mmol/L Final         Failed - Valid encounter within last 6 months    Recent Outpatient Visits           7 months ago Essential (primary) hypertension   [place] [facility] at [facility], Jesse Sans, MD   1 year ago Acute left-sided low back pain with left-sided sciatica   [place] [facility] at [facility], Jesse Sans, MD   1 year ago Essential (primary) hypertension   [place] [facility] at [facility], Jesse Sans, MD   1 year ago Encounter for screening mammogram for breast cancer   [place] [facility] at [facility], Jesse Sans, MD   2 years ago Prediabetes   [place] at [facility], Jesse Sans, MD              Passed - Last BP in normal range    BP Readings from  Last 1 Encounters:  10/30/21 138/78

## 2022-06-27 ENCOUNTER — Other Ambulatory Visit: Payer: Self-pay | Admitting: Internal Medicine

## 2022-06-27 DIAGNOSIS — I10 Essential (primary) hypertension: Secondary | ICD-10-CM

## 2022-06-28 NOTE — Telephone Encounter (Signed)
Called patient to schedule appt for medication refill. No answer, LMVTCB.

## 2022-06-28 NOTE — Telephone Encounter (Signed)
Requested medication (s) are due for refill today: yes  Requested medication (s) are on the active medication list: yes  Last refill:  05/29/22 #30 0 refills  Future visit scheduled: no   Notes to clinic:   do you want to give another courtesy refill? Called patient to schedule appt no answer, LVMTCB.     Requested Prescriptions  Pending Prescriptions Disp Refills   lisinopril-hydrochlorothiazide (ZESTORETIC) 10-12.5 MG tablet [Pharmacy Med Name: LISINOPRIL-HCTZ 10-12.5 MG TAB] 30 tablet 0    Sig: TAKE 1 TABLET BY MOUTH EVERY DAY     Cardiovascular:  ACEI + Diuretic Combos Failed - 06/27/2022  2:06 AM      Failed - Na in normal range and within 180 days    Sodium  Date Value Ref Range Status  05/13/2021 133 (L) 135 - 145 mmol/L Final  04/12/2021 138 134 - 144 mmol/L Final         Failed - K in normal range and within 180 days    Potassium  Date Value Ref Range Status  05/13/2021 3.1 (L) 3.5 - 5.1 mmol/L Final         Failed - Cr in normal range and within 180 days    Creatinine, Ser  Date Value Ref Range Status  05/13/2021 0.69 0.44 - 1.00 mg/dL Final         Failed - eGFR is 30 or above and within 180 days    GFR calc Af Amer  Date Value Ref Range Status  11/25/2019 97 >59 mL/min/1.73 Final   GFR, Estimated  Date Value Ref Range Status  05/13/2021 >60 >60 mL/min Final    Comment:    (NOTE) Calculated using the CKD-EPI Creatinine Equation (2021)    eGFR  Date Value Ref Range Status  04/12/2021 77 >59 mL/min/1.73 Final         Failed - Valid encounter within last 6 months    Recent Outpatient Visits           8 months ago Essential (primary) hypertension   [place] [facility] at [facility], Jesse Sans, MD   1 year ago Acute left-sided low back pain with left-sided sciatica   [place] [facility] at [facility], Jesse Sans, MD   1 year ago Essential (primary) hypertension   [facility] at [facility], Jesse Sans, MD   1 year ago Encounter for screening mammogram for breast cancer   [place] [facility] at [facility], Jesse Sans, MD   2 years ago Prediabetes   [facility] at [facility], Jesse Sans, MD              Passed - Patient is not pregnant      Passed - Last BP in normal range    BP Readings from Last 1 Encounters:  10/30/21 138/78

## 2022-07-01 NOTE — Telephone Encounter (Signed)
Patient is no longer a patient in our practice. She now see's [facility].

## 2022-09-04 DIAGNOSIS — R262 Difficulty in walking, not elsewhere classified: Secondary | ICD-10-CM | POA: Diagnosis not present

## 2022-09-04 DIAGNOSIS — R296 Repeated falls: Secondary | ICD-10-CM | POA: Diagnosis not present

## 2022-09-10 DIAGNOSIS — R296 Repeated falls: Secondary | ICD-10-CM | POA: Diagnosis not present

## 2022-09-10 DIAGNOSIS — R262 Difficulty in walking, not elsewhere classified: Secondary | ICD-10-CM | POA: Diagnosis not present

## 2022-09-12 DIAGNOSIS — R296 Repeated falls: Secondary | ICD-10-CM | POA: Diagnosis not present

## 2022-09-12 DIAGNOSIS — R262 Difficulty in walking, not elsewhere classified: Secondary | ICD-10-CM | POA: Diagnosis not present

## 2022-09-18 DIAGNOSIS — R262 Difficulty in walking, not elsewhere classified: Secondary | ICD-10-CM | POA: Diagnosis not present

## 2022-09-18 DIAGNOSIS — R296 Repeated falls: Secondary | ICD-10-CM | POA: Diagnosis not present

## 2022-09-20 DIAGNOSIS — R262 Difficulty in walking, not elsewhere classified: Secondary | ICD-10-CM | POA: Diagnosis not present

## 2022-09-20 DIAGNOSIS — R296 Repeated falls: Secondary | ICD-10-CM | POA: Diagnosis not present

## 2022-09-24 DIAGNOSIS — R262 Difficulty in walking, not elsewhere classified: Secondary | ICD-10-CM | POA: Diagnosis not present

## 2022-09-24 DIAGNOSIS — R296 Repeated falls: Secondary | ICD-10-CM | POA: Diagnosis not present

## 2022-09-27 DIAGNOSIS — R262 Difficulty in walking, not elsewhere classified: Secondary | ICD-10-CM | POA: Diagnosis not present

## 2022-09-27 DIAGNOSIS — R296 Repeated falls: Secondary | ICD-10-CM | POA: Diagnosis not present

## 2022-10-01 DIAGNOSIS — R262 Difficulty in walking, not elsewhere classified: Secondary | ICD-10-CM | POA: Diagnosis not present

## 2022-10-01 DIAGNOSIS — R296 Repeated falls: Secondary | ICD-10-CM | POA: Diagnosis not present

## 2022-10-03 DIAGNOSIS — I10 Essential (primary) hypertension: Secondary | ICD-10-CM | POA: Diagnosis not present

## 2022-10-03 DIAGNOSIS — R2681 Unsteadiness on feet: Secondary | ICD-10-CM | POA: Diagnosis not present

## 2022-10-03 DIAGNOSIS — I671 Cerebral aneurysm, nonruptured: Secondary | ICD-10-CM | POA: Diagnosis not present

## 2022-10-03 DIAGNOSIS — I6523 Occlusion and stenosis of bilateral carotid arteries: Secondary | ICD-10-CM | POA: Diagnosis not present

## 2022-10-03 DIAGNOSIS — R7303 Prediabetes: Secondary | ICD-10-CM | POA: Diagnosis not present

## 2022-10-03 DIAGNOSIS — E782 Mixed hyperlipidemia: Secondary | ICD-10-CM | POA: Diagnosis not present

## 2022-10-03 DIAGNOSIS — R5383 Other fatigue: Secondary | ICD-10-CM | POA: Diagnosis not present

## 2022-10-04 DIAGNOSIS — R296 Repeated falls: Secondary | ICD-10-CM | POA: Diagnosis not present

## 2022-10-04 DIAGNOSIS — R262 Difficulty in walking, not elsewhere classified: Secondary | ICD-10-CM | POA: Diagnosis not present

## 2022-10-08 DIAGNOSIS — R296 Repeated falls: Secondary | ICD-10-CM | POA: Diagnosis not present

## 2022-10-08 DIAGNOSIS — R262 Difficulty in walking, not elsewhere classified: Secondary | ICD-10-CM | POA: Diagnosis not present

## 2022-10-09 DIAGNOSIS — I491 Atrial premature depolarization: Secondary | ICD-10-CM | POA: Diagnosis not present

## 2022-10-09 DIAGNOSIS — I639 Cerebral infarction, unspecified: Secondary | ICD-10-CM | POA: Diagnosis not present

## 2022-10-09 DIAGNOSIS — I10 Essential (primary) hypertension: Secondary | ICD-10-CM | POA: Diagnosis not present

## 2022-10-09 DIAGNOSIS — E782 Mixed hyperlipidemia: Secondary | ICD-10-CM | POA: Diagnosis not present

## 2022-10-09 DIAGNOSIS — R9431 Abnormal electrocardiogram [ECG] [EKG]: Secondary | ICD-10-CM | POA: Diagnosis not present

## 2022-10-09 DIAGNOSIS — I6523 Occlusion and stenosis of bilateral carotid arteries: Secondary | ICD-10-CM | POA: Diagnosis not present

## 2022-10-09 DIAGNOSIS — I951 Orthostatic hypotension: Secondary | ICD-10-CM | POA: Diagnosis not present

## 2022-10-09 DIAGNOSIS — I499 Cardiac arrhythmia, unspecified: Secondary | ICD-10-CM | POA: Diagnosis not present

## 2022-10-11 DIAGNOSIS — R262 Difficulty in walking, not elsewhere classified: Secondary | ICD-10-CM | POA: Diagnosis not present

## 2022-10-11 DIAGNOSIS — R296 Repeated falls: Secondary | ICD-10-CM | POA: Diagnosis not present

## 2022-10-18 DIAGNOSIS — R262 Difficulty in walking, not elsewhere classified: Secondary | ICD-10-CM | POA: Diagnosis not present

## 2022-10-18 DIAGNOSIS — R296 Repeated falls: Secondary | ICD-10-CM | POA: Diagnosis not present

## 2022-10-22 DIAGNOSIS — R296 Repeated falls: Secondary | ICD-10-CM | POA: Diagnosis not present

## 2022-10-22 DIAGNOSIS — R262 Difficulty in walking, not elsewhere classified: Secondary | ICD-10-CM | POA: Diagnosis not present

## 2022-10-23 DIAGNOSIS — I351 Nonrheumatic aortic (valve) insufficiency: Secondary | ICD-10-CM | POA: Diagnosis not present

## 2022-10-23 DIAGNOSIS — R030 Elevated blood-pressure reading, without diagnosis of hypertension: Secondary | ICD-10-CM | POA: Diagnosis not present

## 2022-10-25 DIAGNOSIS — R296 Repeated falls: Secondary | ICD-10-CM | POA: Diagnosis not present

## 2022-10-25 DIAGNOSIS — R262 Difficulty in walking, not elsewhere classified: Secondary | ICD-10-CM | POA: Diagnosis not present

## 2022-10-28 DIAGNOSIS — I499 Cardiac arrhythmia, unspecified: Secondary | ICD-10-CM | POA: Diagnosis not present

## 2022-10-28 DIAGNOSIS — I491 Atrial premature depolarization: Secondary | ICD-10-CM | POA: Diagnosis not present

## 2022-10-29 DIAGNOSIS — I351 Nonrheumatic aortic (valve) insufficiency: Secondary | ICD-10-CM | POA: Diagnosis not present

## 2022-10-29 DIAGNOSIS — I498 Other specified cardiac arrhythmias: Secondary | ICD-10-CM | POA: Diagnosis not present

## 2022-10-29 DIAGNOSIS — I491 Atrial premature depolarization: Secondary | ICD-10-CM | POA: Diagnosis not present

## 2022-10-29 DIAGNOSIS — I071 Rheumatic tricuspid insufficiency: Secondary | ICD-10-CM | POA: Diagnosis not present

## 2022-10-29 DIAGNOSIS — I10 Essential (primary) hypertension: Secondary | ICD-10-CM | POA: Diagnosis not present

## 2022-10-29 DIAGNOSIS — E782 Mixed hyperlipidemia: Secondary | ICD-10-CM | POA: Diagnosis not present

## 2022-10-29 DIAGNOSIS — I6523 Occlusion and stenosis of bilateral carotid arteries: Secondary | ICD-10-CM | POA: Diagnosis not present

## 2022-10-29 DIAGNOSIS — I639 Cerebral infarction, unspecified: Secondary | ICD-10-CM | POA: Diagnosis not present

## 2022-10-30 DIAGNOSIS — R262 Difficulty in walking, not elsewhere classified: Secondary | ICD-10-CM | POA: Diagnosis not present

## 2022-10-30 DIAGNOSIS — R296 Repeated falls: Secondary | ICD-10-CM | POA: Diagnosis not present

## 2022-11-18 DIAGNOSIS — I471 Supraventricular tachycardia, unspecified: Secondary | ICD-10-CM | POA: Diagnosis not present

## 2022-11-18 DIAGNOSIS — I491 Atrial premature depolarization: Secondary | ICD-10-CM | POA: Diagnosis not present

## 2022-12-07 ENCOUNTER — Observation Stay
Admission: EM | Admit: 2022-12-07 | Discharge: 2022-12-08 | Disposition: A | Discharge status: Home / Self Care | Payer: Medicare Other | Attending: Internal Medicine | Admitting: Internal Medicine

## 2022-12-07 DIAGNOSIS — I10 Essential (primary) hypertension: Secondary | ICD-10-CM | POA: Diagnosis present

## 2022-12-07 DIAGNOSIS — I959 Hypotension, unspecified: Secondary | ICD-10-CM | POA: Diagnosis not present

## 2022-12-07 DIAGNOSIS — I671 Cerebral aneurysm, nonruptured: Secondary | ICD-10-CM | POA: Insufficient documentation

## 2022-12-07 DIAGNOSIS — Z79899 Other long term (current) drug therapy: Secondary | ICD-10-CM | POA: Insufficient documentation

## 2022-12-07 DIAGNOSIS — I6523 Occlusion and stenosis of bilateral carotid arteries: Secondary | ICD-10-CM | POA: Insufficient documentation

## 2022-12-07 DIAGNOSIS — Z8673 Personal history of transient ischemic attack (TIA), and cerebral infarction without residual deficits: Secondary | ICD-10-CM | POA: Insufficient documentation

## 2022-12-07 DIAGNOSIS — R55 Syncope and collapse: Principal | ICD-10-CM | POA: Diagnosis present

## 2022-12-07 DIAGNOSIS — I2510 Atherosclerotic heart disease of native coronary artery without angina pectoris: Secondary | ICD-10-CM | POA: Diagnosis not present

## 2022-12-07 DIAGNOSIS — I6529 Occlusion and stenosis of unspecified carotid artery: Secondary | ICD-10-CM | POA: Diagnosis present

## 2022-12-07 DIAGNOSIS — R404 Transient alteration of awareness: Secondary | ICD-10-CM | POA: Diagnosis not present

## 2022-12-07 DIAGNOSIS — K219 Gastro-esophageal reflux disease without esophagitis: Secondary | ICD-10-CM | POA: Diagnosis present

## 2022-12-07 DIAGNOSIS — R110 Nausea: Secondary | ICD-10-CM | POA: Diagnosis not present

## 2022-12-07 NOTE — ED Triage Notes (Signed)
Pt from home was with her friends and had a syncopal episode and sat in a chair reports no injury from syncopal episode. Pt received 300 mL NS. Pt HR was in the 40's and 1mg  of atropine was given heart rate then in the 70's.

## 2022-12-08 ENCOUNTER — Ambulatory Visit: Admit: 2022-12-08 | Payer: Medicare Other

## 2022-12-08 ENCOUNTER — Inpatient Hospital Stay: Payer: Medicare Other

## 2022-12-08 ENCOUNTER — Other Ambulatory Visit: Payer: Self-pay

## 2022-12-08 DIAGNOSIS — R55 Syncope and collapse: Secondary | ICD-10-CM | POA: Diagnosis not present

## 2022-12-08 DIAGNOSIS — R42 Dizziness and giddiness: Secondary | ICD-10-CM | POA: Diagnosis not present

## 2022-12-08 DIAGNOSIS — I6523 Occlusion and stenosis of bilateral carotid arteries: Secondary | ICD-10-CM | POA: Diagnosis not present

## 2022-12-08 LAB — COMPREHENSIVE METABOLIC PANEL
ALT: 15 U/L (ref 0–44)
AST: 19 U/L (ref 15–41)
Albumin: 3.9 g/dL (ref 3.5–5.0)
Alkaline Phosphatase: 44 U/L (ref 38–126)
Anion gap: 9 (ref 5–15)
BUN: 38 mg/dL — ABNORMAL HIGH (ref 8–23)
CO2: 20 mmol/L — ABNORMAL LOW (ref 22–32)
Calcium: 9.7 mg/dL (ref 8.9–10.3)
Chloride: 105 mmol/L (ref 98–111)
Creatinine, Ser: 0.89 mg/dL (ref 0.44–1.00)
GFR, Estimated: >60 mL/min (ref >60)
Glucose, Bld: 135 mg/dL — ABNORMAL HIGH (ref 70–99)
Potassium: 3.6 mmol/L (ref 3.5–5.1)
Sodium: 134 mmol/L — ABNORMAL LOW (ref 135–145)
Total Bilirubin: 0.4 mg/dL (ref 0.3–1.2)
Total Protein: 6.5 g/dL (ref 6.5–8.1)

## 2022-12-08 LAB — HEMOGLOBIN A1C
Hgb A1c MFr Bld: 6.8 % — ABNORMAL HIGH (ref 4.8–5.6)
Mean Plasma Glucose: 148.46 mg/dL

## 2022-12-08 LAB — CBC
HCT: 35.5 % — ABNORMAL LOW (ref 36.0–46.0)
Hemoglobin: 11.5 g/dL — ABNORMAL LOW (ref 12.0–15.0)
MCH: 31.9 pg (ref 26.0–34.0)
MCHC: 32.4 g/dL (ref 30.0–36.0)
MCV: 98.3 fL (ref 80.0–100.0)
Platelets: 205 10*3/uL (ref 150–400)
RBC: 3.61 MIL/uL — ABNORMAL LOW (ref 3.87–5.11)
RDW: 12.8 % (ref 11.5–15.5)
WBC: 8.9 10*3/uL (ref 4.0–10.5)
nRBC: 0 % (ref 0.0–0.2)

## 2022-12-08 LAB — LIPID PANEL
Cholesterol: 194 mg/dL (ref 0–200)
HDL: 52 mg/dL (ref >40)
LDL Cholesterol: 132 mg/dL — ABNORMAL HIGH (ref 0–99)
Total CHOL/HDL Ratio: 3.7 RATIO
Triglycerides: 50 mg/dL (ref <150)
VLDL: 10 mg/dL (ref 0–40)

## 2022-12-08 MED ORDER — HEPARIN SODIUM (PORCINE) 5000 UNIT/ML IJ SOLN
5000.0000 [IU] | Freq: Two times a day (BID) | INTRAMUSCULAR | Status: DC
  Administered 2022-12-08: 5000 [IU] via SUBCUTANEOUS
  Filled 2022-12-08: qty 1

## 2022-12-08 MED ORDER — SODIUM CHLORIDE 0.9 % IV SOLN: INTRAVENOUS | Status: DC

## 2022-12-08 MED ORDER — ACETAMINOPHEN 160 MG/5ML PO SOLN: 650.0000 mg | ORAL | Status: DC | PRN

## 2022-12-08 MED ORDER — STROKE: EARLY STAGES OF RECOVERY BOOK: Freq: Once | Status: DC

## 2022-12-08 MED ORDER — METOPROLOL SUCCINATE ER 25 MG PO TB24: 25.0000 mg | ORAL_TABLET | Freq: Every day | ORAL | Status: DC

## 2022-12-08 MED ORDER — ACETAMINOPHEN 325 MG PO TABS: 650.0000 mg | ORAL_TABLET | ORAL | Status: DC | PRN

## 2022-12-08 MED ORDER — LATANOPROST 0.005 % OP SOLN
1.0000 [drp] | Freq: Every day | OPHTHALMIC | Status: DC
  Filled 2022-12-08: qty 2.5

## 2022-12-08 MED ORDER — ACETAMINOPHEN 325 MG RE SUPP: 650.0000 mg | RECTAL | Status: DC | PRN

## 2022-12-08 MED ORDER — MIRABEGRON ER 50 MG PO TB24: 50.0000 mg | ORAL_TABLET | Freq: Every day | ORAL | Status: DC

## 2022-12-08 MED ORDER — ATORVASTATIN CALCIUM 20 MG PO TABS: 40.0000 mg | ORAL_TABLET | Freq: Every day | ORAL | Status: DC

## 2022-12-08 MED ORDER — ASPIRIN 325 MG PO TBEC: 325.0000 mg | DELAYED_RELEASE_TABLET | Freq: Every day | ORAL | Status: DC

## 2022-12-08 MED ORDER — GADOBUTROL 1 MMOL/ML IV SOLN
8.0000 mL | Freq: Once | INTRAVENOUS | Status: AC | PRN
  Administered 2022-12-08: 8 mL via INTRAVENOUS

## 2022-12-08 NOTE — Assessment & Plan Note (Signed)
IV PPI therapy.  

## 2022-12-08 NOTE — ED Notes (Signed)
Pt given water, friends at bedside. Denies other needs at this time.

## 2022-12-08 NOTE — Assessment & Plan Note (Addendum)
Patient presenting with syncopal episodes. No CT on admission.  We will proceed with MRI. Will obtain an MRI and an MRA of the head and neck. Neurology consult as deemed appropriate. 2D echocardiogram with bubble study. Patient to continue aspirin 81. A.m. lipid panel.

## 2022-12-08 NOTE — Assessment & Plan Note (Signed)
Vitals:   12/08/22 0005 12/08/22 0030 12/08/22 0100 12/08/22 0230  BP: (!) 143/75 117/78 (!) 128/57 (!) 108/56   12/08/22 0300 12/08/22 0330 12/08/22 0400  BP: (!) 112/56 (!) 122/58 (!) 98/53  Will lower patient's blood pressure medication dose. Once med rec is available patient will need to discontinue HCTZ.

## 2022-12-08 NOTE — Discharge Summary (Signed)
Physician Discharge Summary  Bonnie Jackson:[id number] DOB: April 18, 1941 DOA: 12/07/2022  PCP: Cleon Dew, FNP  Admit date: 12/07/2022 Discharge date: 12/08/2022  Admitted From: Home Disposition:  Home  Recommendations for Outpatient Follow-up:  Follow up with PCP in 1-2 weeks Consider follow-up with vascular surgery  Home Health: No Equipment/Devices: None  Discharge Condition: Stable CODE STATUS: Full Diet recommendation: Regular  Brief/Interim Summary:  82 y.o. female seen in the emergency room today for syncopal episode while on her porch of her home.  Patient was with her friends and had her dog found and started feeling dizzy and friends patient held her on both sides of her arm and prevented her from falling but patient did pass out for a little bit.   Patient has known bilateral carotid artery stenosis that was evaluated by vascular surgery at [facility] approximately 6 months ago.  At that time the stenosis was asymptomatic and due to patient's lack of symptoms and advanced age and high risk of any intervention no procedure was recommended at that time.  Repeat MRA performed at [facility] demonstrates known near total occlusion of left internal carotid artery, right carotid artery with a 60% stenosis.  Despite these findings MRA of the head showed no significant intracranial atherosclerosis.  Patient is back to baseline.  We had a lengthy discussion regarding imaging findings and possible interventions.  At time of this note patient is stable for discharge home.  She is cautioned about imaging findings and her increased risk of CVA.  She will follow-up with her primary care physician for further instructions and consider repeat evaluation by vascular surgery.  Will resume her home aspirin 325 mg daily and Lipitor 40 mg daily.  No changes made in her medication regimen.    Discharge Diagnoses:  Principal Problem:   Syncope and collapse Active Problems:   Carotid artery stenosis    Essential (primary) hypertension   Gastroesophageal reflux disease  Syncope and collapse Suspect vasovagal etiology in the setting of known near total occlusion of left ICA.  Right ICA with 60% stenosis.  No significant intracranial atherosclerosis noted on MRA head.  Back to baseline.  Suspect vasovagal etiology.  Patient is cautioned on her increased risk of syncope in the setting.  Strongly suggest using a walker at all times and having an available seat should she experience symptoms of presyncope in the future.  Discharge Instructions  Discharge Instructions     Diet - low sodium heart healthy   Complete by: As directed    Increase activity slowly   Complete by: As directed       Allergies as of 12/08/2022   No Known Allergies      Medication List     STOP taking these medications    mirabegron ER 50 MG Tb24 tablet Commonly known as: MYRBETRIQ       TAKE these medications    aspirin EC 325 MG tablet Take 325 mg by mouth daily.   atorvastatin 40 MG tablet Commonly known as: LIPITOR Take 1 tablet by mouth daily.   hydrochlorothiazide 25 MG tablet Commonly known as: HYDRODIURIL TAKE 1/2 TABLET BY MOUTH EVERY DAY   latanoprost 0.005 % ophthalmic solution Commonly known as: XALATAN Place 1 drop into both eyes at bedtime.   lisinopril-hydrochlorothiazide 10-12.5 MG tablet Commonly known as: ZESTORETIC TAKE 1 TABLET BY MOUTH EVERY DAY   metoprolol succinate 25 MG 24 hr tablet Commonly known as: TOPROL-XL Take 1 tablet by mouth daily.  Follow-up Information     Darlen Round, MD Follow up.   Specialty: Specialist Why: Vascular surgery. I recommend discussing followup with your primary care doctor first Contact information: [address] [place] [address] [phone]                No Known Allergies  Consultations: None   Procedures/Studies: MR ANGIO HEAD WO CONTRAST  Result Date: 12/08/2022 CLINICAL DATA:   82 year old female status post syncope. Dizziness. EXAM: MRA HEAD WITHOUT CONTRAST TECHNIQUE: Angiographic images of the Circle of Willis were acquired using MRA technique without intravenous contrast. COMPARISON:  Neck MRA and brain MRI today reported separately. Previous brain MRA 11/16/2015. FINDINGS: Anterior circulation: Symmetric antegrade flow in both ICA siphons despite the abnormal neck MRA today. No evidence of siphon stenosis. Ophthalmic and right posterior communicating artery origins are normal. Patent carotid termini. Normal MCA and ACA origins. Normal anterior communicating artery. Median artery of the corpus callosum (normal variant) along with dominant appearing left ACA A2. MCA M1 segments and bi/trifurcation appear patent without stenosis. Visible bilateral MCA and ACA branches are stable and within normal limits. Posterior circulation: Antegrade flow in the posterior circulation with fairly codominant and normal appearing distal vertebral arteries. Normal left PICA origin. Patent vertebrobasilar junction and basilar artery without evidence of stenosis. AICA, SCA, and left PCA origins are normal. Fetal type right PCA origin (normal variant. Left posterior communicating artery is diminutive or absent. 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 Bilateral PCA branches appear normal. Anatomic variants: Fetal type right PCA origin. Dominant left ACA A2. Median artery of the corpus callosum. Other: Brain MRI today is reported separately. IMPRESSION: 1. Despite abnormal cervical ICAs (see Neck MRA today), the intracranial anterior circulation is normal for age. 2. A chronic Basilar tip Aneurysm, 3-4 mm and incorporating the right SCA origin, appears unchanged since 2017. 3. Otherwise normal for age posterior circulation. Electronically Signed   By: Odessa Fleming  M.D.   On: 12/08/2022 07:06   MR ANGIO NECK W WO CONTRAST  Result Date: 12/08/2022 CLINICAL DATA:  82 year old female status post syncope. Dizziness. EXAM: MRA NECK WITHOUT AND WITH CONTRAST TECHNIQUE: Multiplanar and multiecho pulse sequences of the neck were obtained without and with intravenous contrast. Angiographic images of the neck were obtained using MRA technique without and with intravenous contrast. CONTRAST:  8mL GADAVIST GADOBUTROL 1 MMOL/ML IV SOLN COMPARISON:  Brain MRI and intracranial MRA today reported separately. Carotid Doppler ultrasound 11/13/2021. FINDINGS: Precontrast time-of-flight images demonstrate antegrade flow in both cervical carotid and vertebral arteries. Tortuous proximal right CCA is apparent. Vertebral arteries appear fairly codominant. Irregularity at both carotid bifurcations, especially the proximal left ICA. Antegrade flow continues to the skull base. Following contrast 3 vessel arch configuration noted. Proximal great vessels are patent. Tortuous but otherwise negative right CCA. Moderate irregularity and estimated 60 % stenosis with respect to the distal vessel of the proximal right ICA along a 12 mm segment from the origin to the distal bulb (series 1080, image 5). Distal to that the cervical right ICA appears normal. Visible right ICA siphon and terminus appear negative. Tortuous proximal left CCA with a kinked appearance at the thoracic inlet. Severe stenosis at the left ICA origin and bulb, MRA radiographic string sign along a segment of 6 mm on series 11, image 44, also series  1080, image 7. But the left ICA remains patent, otherwise negative to the skull base. Visible left ICA siphon and terminus are negative. Proximal right subclavian arteries patent with no significant stenosis. Right vertebral artery origin is within normal limits. Proximal left subclavian artery is patent with tortuosity and a kinked appearance at the thoracic inlet. The left vertebral artery  origin remains normal. Fairly codominant vertebral arteries appear normal to the vertebrobasilar junction. Grossly negative visible posterior circulation. IMPRESSION: 1. Positive for very severe proximal Left ICA STRING-SIGN-STENOSIS, Near Occlusion. 2. Proximal Right ICA estimated 60% stenosis throughout the origin and bulb. 3. Incidental bilateral CCA and proximal subclavian artery tortuosity at the thoracic inlet. 4. Vertebral arteries appear normal. Electronically Signed   By: Odessa Fleming M.D.   On: 12/08/2022 06:56   MR BRAIN WO CONTRAST  Result Date: 12/08/2022 CLINICAL DATA:  82 year old female status post syncope.  Dizziness. EXAM: MRI HEAD WITHOUT CONTRAST TECHNIQUE: Multiplanar, multiecho pulse sequences of the brain and surrounding structures were obtained without intravenous contrast. COMPARISON:  MRA today is reported separately. Previous brain MRI 02/27/2016. FINDINGS: Brain: Cerebral volume not significantly changed from 2017 and within normal limits for age. No restricted diffusion to suggest acute infarction. No midline shift, mass effect, evidence of mass lesion, ventriculomegaly, extra-axial collection or acute intracranial hemorrhage. Cervicomedullary junction and pituitary are within normal limits. Chronic lacunar infarcts of the superior right thalamus (series 17, image 83), dorsal right pons are both stable since 2017. Superimposed moderate widespread scattered, patchy bilateral cerebral T2 and FLAIR hyperintensity does not appear significantly changed. There is a subtle area of chronic encephalomalacia at the anterior left superior temporal gyrus, stable and more likely posttraumatic than post ischemic. No other cortical encephalomalacia identified. No chronic cerebral blood products on SWI. Vascular: Major intracranial vascular flow voids are stable since 2017. MRA is reported separately. Skull and upper cervical spine: Normal for age visible cervical spine. Visualized bone marrow signal is  within normal limits. Sinuses/Orbits: Postoperative changes to both globes since 2017. Otherwise negative orbits. Mild paranasal sinus mucosal thickening or layering fluid has not significantly changed. Other: Mastoids remain clear. Visible internal auditory structures appear normal. Stylomastoid foramina, visible scalp and face appear negative. IMPRESSION: 1. No acute intracranial abnormality. 2. Essentially stable noncontrast MRI appearance of the brain since 2017. Chronic lacunar infarcts in the right thalamus and pons. Moderately advanced cerebral white matter signal changes which are nonspecific but also most commonly due to small vessel disease. And small probably posttraumatic area of left temporal lobe encephalomalacia. Electronically Signed   By: Odessa Fleming M.D.   On: 12/08/2022 06:50      Subjective: Seen and examined on the day of discharge.  Back to baseline.  Stable for discharge home.  Discharge Exam: Vitals:   12/08/22 0800 12/08/22 0830  BP: 122/87 (!) 133/59  Pulse: 66 (!) 45  Resp: 19 17  Temp:    SpO2: 98% 98%   Vitals:   12/08/22 0725 12/08/22 0730 12/08/22 0800 12/08/22 0830  BP:  (!) 136/121 122/87 (!) 133/59  Pulse:  (!) 57 66 (!) 45  Resp:  20 19 17   Temp: 97.7 F (36.5 C)     TempSrc: Oral     SpO2:  96% 98% 98%  Weight:      Height:        General: Pt is alert, awake, not in acute distress Cardiovascular: RRR, S1/S2 +, no rubs, no gallops Respiratory: CTA bilaterally, no wheezing, no rhonchi Abdominal: Soft,  NT, ND, bowel sounds + Extremities: no edema, no cyanosis    The results of significant diagnostics from this hospitalization (including imaging, microbiology, ancillary and laboratory) are listed below for reference.     Microbiology: No results found for this or any previous visit (from the past 240 hour(s)).   Labs: BNP (last 3 results) No results for input(s): "BNP" in the last 8760 hours. Basic Metabolic Panel: Recent Labs  Lab  12/08/22 0006  NA 134*  K 3.6  CL 105  CO2 20*  GLUCOSE 135*  BUN 38*  CREATININE 0.89  CALCIUM 9.7   Liver Function Tests: Recent Labs  Lab 12/08/22 0006  AST 19  ALT 15  ALKPHOS 44  BILITOT 0.4  PROT 6.5  ALBUMIN 3.9   No results for input(s): "LIPASE", "AMYLASE" in the last 168 hours. No results for input(s): "AMMONIA" in the last 168 hours. CBC: Recent Labs  Lab 12/08/22 0006  WBC 8.9  HGB 11.5*  HCT 35.5*  MCV 98.3  PLT 205   Cardiac Enzymes: No results for input(s): "CKTOTAL", "CKMB", "CKMBINDEX", "TROPONINI" in the last 168 hours. BNP: Invalid input(s): "POCBNP" CBG: No results for input(s): "GLUCAP" in the last 168 hours. D-Dimer No results for input(s): "DDIMER" in the last 72 hours. Hgb A1c Recent Labs    12/08/22 0524  HGBA1C 6.8*   Lipid Profile Recent Labs    12/08/22 0524  CHOL 194  HDL 52  LDLCALC 132*  TRIG 50  CHOLHDL 3.7   Thyroid function studies No results for input(s): "TSH", "T4TOTAL", "T3FREE", "THYROIDAB" in the last 72 hours.  Invalid input(s): "FREET3" Anemia work up No results for input(s): "VITAMINB12", "FOLATE", "FERRITIN", "TIBC", "IRON", "RETICCTPCT" in the last 72 hours. Urinalysis    Component Value Date/Time   COLORURINE COLORLESS (A) 02/27/2016 1211   APPEARANCEUR CLEAR (A) 02/27/2016 1211   LABSPEC 1.002 (L) 02/27/2016 1211   PHURINE 6.0 02/27/2016 1211   GLUCOSEU NEGATIVE 02/27/2016 1211   HGBUR NEGATIVE 02/27/2016 1211   BILIRUBINUR eng 03/27/2017 1451   KETONESUR NEGATIVE 02/27/2016 1211   PROTEINUR neg 03/27/2017 1451   PROTEINUR NEGATIVE 02/27/2016 1211   UROBILINOGEN 0.2 03/27/2017 1451   NITRITE neg 03/27/2017 1451   NITRITE NEGATIVE 02/27/2016 1211   LEUKOCYTESUR Negative 03/27/2017 1451   Sepsis Labs Recent Labs  Lab 12/08/22 0006  WBC 8.9   Microbiology No results found for this or any previous visit (from the past 240 hour(s)).   Time coordinating discharge: Over 30  minutes  SIGNED:   Tresa Moore, MD  [facility] 12/08/2022, 2:24 PM Pager   If 7PM-7AM, please contact night-coverage

## 2022-12-08 NOTE — ED Notes (Signed)
Pt gone to scans 

## 2022-12-08 NOTE — ED Provider Notes (Signed)
[facility] Provider Note    Event Date/Time   First MD Initiated Contact with Patient 12/08/22 0024     (approximate)   History   Loss of Consciousness   HPI  Bonnie Jackson is a 82 y.o. female who on review of primary care note from October 30, 2021 has a history of hypertension and loss of consciousness.  Additional history of carotid stenosis, ophthalmic infarct, cerebral aneurysm  Primary care note from March 28 notes that the primary care was ordering bilateral carotid artery ultrasounds.  At that time her left carotid artery appeared to have significant blockage    EMS gave 1 dose of atropine for bradycardia  Family reports patient was standing with them having normal conversation when she started and lightheaded.  As a try to move her into the home please set her in a chair her eyes rolled back in her head rolled back and she turned gray briefly.  She then came to requesting to be placed in her bed and was hesitant to come to the hospital but convinced by family.  I did discuss further with her previous workup with where she passed out, she reports that she told the doctor and they told her about her narrowed carotid artery that she did not want to consider surgery on it----and, thus, it seems she never did have further follow-up  Patient reports no symptoms now.  No chest pain no headache no numbness no tingling no weakness.  She is acting behaving normally per family at the bedside.  She had no preceding symptoms of feeling lightheaded while standing.  Physical Exam   Triage Vital Signs: ED Triage Vitals  Enc Vitals Group     BP 12/08/22 0005 (!) 143/75     Pulse Rate 12/08/22 0002 72     Resp 12/08/22 0005 16     Temp 12/08/22 0002 (!) 97.5 F (36.4 C)     Temp Source 12/08/22 0002 Oral     SpO2 12/08/22 0002 98 %     Weight 12/08/22 0002 175 lb (79.4 kg)     Height 12/08/22 0002 5\' 4"  (1.626 m)     Head Circumference --      Peak Flow --       Pain Score 12/08/22 0001 0     Pain Loc --      Pain Edu? --      Excl. in GC? --     Most recent vital signs: Vitals:   12/08/22 0030 12/08/22 0100  BP: 117/78 (!) 128/57  Pulse: 68 62  Resp: 20 14  Temp:    SpO2: 100% 100%     General: Awake, no distress.  Normocephalic atraumatic.  No carotid bruits noted bilaterally. CV:  Good peripheral perfusion.  Normal rate and tones Resp:  Normal effort.  Clear bilateral Abd:  No distention.  Other:  Moves all extremities well.  Normal facial expressions.  Cranial nerves intact.  5 out of 5 strength with no pronator drift in all 4 extremities.  Speech is clear  ED Results / Procedures / Treatments   Labs (all labs ordered are listed, but only abnormal results are displayed) Labs Reviewed  CBC - Abnormal; Notable for the following components:      Result Value   RBC 3.61 (*)    Hemoglobin 11.5 (*)    HCT 35.5 (*)    All other components within normal limits  COMPREHENSIVE METABOLIC PANEL - Abnormal; Notable  for the following components:   Sodium 134 (*)    CO2 20 (*)    Glucose, Bld 135 (*)    BUN 38 (*)    All other components within normal limits     EKG  Interpreted by me at 0008 Heart rate 70 QRS 100 QTc 440 Normal sinus rhythm no evidence of acute ischemia    RADIOLOGY     PROCEDURES:  Critical Care performed: No  Procedures   MEDICATIONS ORDERED IN ED: Medications - No data to display   IMPRESSION / MDM / ASSESSMENT AND PLAN / ED COURSE  I reviewed the triage vital signs and the nursing notes.                              Differential diagnosis includes, but is not limited to, syncope, causes could include orthostasis, hypotension, electrolyte abnormality, cardiac dysrhythmia, critical carotid stenosis, etc.  She has known history of concern for carotid stenosis, it does not appear that she ever pursued any further workup beyond having an ultrasound done.  Discussed with her and her family, given  her unexplained episode of syncope tonight, good neurologic recovery at this time, no evidence of stroke, etc. we will plan to admit her for further observation and workup.  I think she would probably benefit from reevaluation of her carotid arteries for concerns of worsening stenosis as well.  Patient's presentation is most consistent with acute complicated illness / injury requiring diagnostic workup.  Labs interpreted as mildly elevated BUN.  Slight anemia.    The patient is on the cardiac monitor to evaluate for evidence of arrhythmia and/or significant heart rate changes.   Clinical Course as of 12/08/22 0206  Wynelle Link Dec 08, 2022  0205 Consulted with, and patient accepted to hospital service by Dr. Renaldo Reel [MQ]    Clinical Course User Index [MQ] Sharyn Creamer, MD   ----------------------------------------- 2:05 AM on 12/08/2022 ----------------------------------------- Patient admitted to hospitalist service for further evaluation for syncope  FINAL CLINICAL IMPRESSION(S) / ED DIAGNOSES   Final diagnoses:  Syncope and collapse     Rx / DC Orders   ED Discharge Orders     None        Note:  This document was prepared using Dragon voice recognition software and may include unintentional dictation errors.   Sharyn Creamer, MD 12/08/22 [phone]

## 2022-12-08 NOTE — Care Management CC44 (Signed)
Condition Code 44 Documentation Completed  Patient Details  Name: Bonnie Jackson MRN: [id number] Date of Birth: 05/29/1941   Condition Code 44 given:  Yes Patient signature on Condition Code 44 notice:  Yes Documentation of 2 MD's agreement:  Yes Code 44 added to claim:  Yes    Susa Simmonds, LCSWA 12/08/2022, 8:48 AM

## 2022-12-08 NOTE — Progress Notes (Signed)
SLP Cancellation Note  Patient Details Name: Bonnie Jackson MRN: [id number] DOB: May 02, 1941   Cancelled treatment:       Reason Eval/Treat Not Completed: SLP screened, no needs identified, will sign off   Per chart review, no focal neurological deficits pertaining to speech/language/cognition. NIHSS = 0. MRI "No acute intracranial abnormality..."  [name], M.S., CCC-SLP Speech-Language Pathologist [facility] [phone] Arnette Felts)  [name] 12/08/2022, 8:18 AM

## 2022-12-08 NOTE — Assessment & Plan Note (Signed)
Patient's on aspirin 325 Lipitor 40 and metoprolol 25 mg. Which we will continue.

## 2022-12-08 NOTE — H&P (Signed)
History and Physical     Patient: Bonnie Jackson:[id number] DOB: Mar 31, 1941 DOA: 12/07/2022 DOS: the patient was seen and examined on 12/08/2022 PCP: Cleon Dew, FNP   Patient coming from: Home  Chief Complaint: Syncope  HISTORY OF PRESENT ILLNESS: Bonnie Jackson is an 82 y.o. female seen in the emergency room today for syncopal episode while on her porch of her home.  Patient was with her friends and had her dog found and started feeling dizzy and friends patient held her on both sides of her arm and prevented her from falling but patient did pass out for a little bit.  Patient was noted to have bradycardia and was given a milligram of atropine by EMS. Patient has bilateral carotid artery stenosis that was evaluated by vascular and patient since has not followed up because she does not want any interventions. Patient was said to have significant blockage of the left carotid artery specifically more so than the right. Patient has a history of cerebral artery aneurysm.  Patient has had syncopal episodes that have started recently.  Patient does not smoke she does drink routinely. Past Medical History:  Diagnosis Date   Brain aneurysm    Cataract    right   CVA (cerebral infarction)    no deficits   Hypertension    Review of Systems  Neurological:  Positive for dizziness and syncope.  All other systems reviewed and are negative.  No Known Allergies Past Surgical History:  Procedure Laterality Date   ABDOMINAL HERNIA REPAIR     CATARACT EXTRACTION W/PHACO Right 06/24/2016   Procedure: CATARACT EXTRACTION PHACO AND INTRAOCULAR LENS PLACEMENT (IOC);  Surgeon: Sherald Hess, MD;  Location: [facility];  Service: Ophthalmology;  Laterality: Right;  RIGHT   CATARACT EXTRACTION W/PHACO Left 10/25/2019   Procedure: CATARACT EXTRACTION PHACO AND INTRAOCULAR LENS PLACEMENT (IOC) LEFT 4.00  00:41.9 ;  Surgeon: Nevada Crane, MD;  Location: [facility];   Service: Ophthalmology;  Laterality: Left;   CHOLECYSTECTOMY     IR GENERIC HISTORICAL  01/12/2016   IR ANGIO INTRA EXTRACRAN SEL INTERNAL CAROTID BILAT MOD SED 01/12/2016 Lisbeth Renshaw, MD [facility]   IR GENERIC HISTORICAL  01/12/2016   IR ANGIO VERTEBRAL SEL VERTEBRAL BILAT MOD SED 01/12/2016 Lisbeth Renshaw, MD [facility]   IR GENERIC HISTORICAL  01/12/2016   IR 3D INDEPENDENT WKST 01/12/2016 Lisbeth Renshaw, MD [facility]   REPLACEMENT TOTAL KNEE Bilateral    TOTAL ABDOMINAL HYSTERECTOMY     MEDICATIONS: Prior to Admission medications   Medication Sig Start Date End Date Taking? Authorizing Provider  aspirin EC 325 MG tablet Take 325 mg by mouth daily.    Yes [provider]  atorvastatin (LIPITOR) 40 MG tablet Take 1 tablet by mouth daily. 11/29/22  Yes [provider]  hydrochlorothiazide (HYDRODIURIL) 25 MG tablet TAKE 1/2 TABLET BY MOUTH EVERY DAY 11/28/21  Yes Reubin Milan, MD  latanoprost (XALATAN) 0.005 % ophthalmic solution Place 1 drop into both eyes at bedtime.   Yes [provider]  lisinopril-hydrochlorothiazide (ZESTORETIC) 10-12.5 MG tablet TAKE 1 TABLET BY MOUTH EVERY DAY 05/29/22  Yes Reubin Milan, MD  metoprolol succinate (TOPROL-XL) 25 MG 24 hr tablet Take 1 tablet by mouth daily. 10/29/22 10/29/23 Yes [provider]  mirabegron ER (MYRBETRIQ) 50 MG TB24 tablet Take 1 tablet (50 mg total) by mouth daily. Patient not taking: Reported on 12/08/2022 10/30/21   Reubin Milan, MD  ED Course: Pt  in Ed alert awake oriented afebrile O2 sats of 95%. Vitals:   12/08/22 0230 12/08/22 0300 12/08/22 0330 12/08/22 0400  BP: (!) 108/56 (!) 112/56 (!) 122/58 (!) 98/53  Pulse: (!) 58 (!) 54 (!) 56 (!) 52  Resp: 16 (!) 23 13 15   Temp:      TempSrc:      SpO2: 95% 97% 97% 94%  Weight:      Height:       Total I/O In: 300 [I.V.:300] Out: -  SpO2: 94 % Blood work in ed shows: Hyponatremia with a sodium of 134 glucose 135  normal kidney function and normal LFTs. CBC shows normal white count of 8.9 normal platelets of 205 and a hemoglobin of 11.5.  Results for orders placed or performed during the hospital encounter of 12/07/22 (from the past 72 hour(s))  CBC     Status: Abnormal   Collection Time: 12/08/22 12:06 AM  Result Value Ref Range   WBC 8.9 4.0 - 10.5 K/uL   RBC 3.61 (L) 3.87 - 5.11 MIL/uL   Hemoglobin 11.5 (L) 12.0 - 15.0 g/dL   HCT 60.4 (L) 54.0 - 98.1 %   MCV 98.3 80.0 - 100.0 fL   MCH 31.9 26.0 - 34.0 pg   MCHC 32.4 30.0 - 36.0 g/dL   RDW 19.1 47.8 - 29.5 %   Platelets 205 150 - 400 K/uL   nRBC 0.0 0.0 - 0.2 %    Comment: Performed at [facility], [address] [place], [place] [address]  Comprehensive metabolic panel     Status: Abnormal   Collection Time: 12/08/22 12:06 AM  Result Value Ref Range   Sodium 134 (L) 135 - 145 mmol/L   Potassium 3.6 3.5 - 5.1 mmol/L   Chloride 105 98 - 111 mmol/L   CO2 20 (L) 22 - 32 mmol/L   Glucose, Bld 135 (H) 70 - 99 mg/dL    Comment: Glucose reference range applies only to samples taken after fasting for at least 8 hours.   BUN 38 (H) 8 - 23 mg/dL   Creatinine, Ser 8.65 0.44 - 1.00 mg/dL   Calcium 9.7 8.9 - 78.4 mg/dL   Total Protein 6.5 6.5 - 8.1 g/dL   Albumin 3.9 3.5 - 5.0 g/dL   AST 19 15 - 41 U/L   ALT 15 0 - 44 U/L   Alkaline Phosphatase 44 38 - 126 U/L   Total Bilirubin 0.4 0.3 - 1.2 mg/dL   GFR, Estimated >69 >62 mL/min    Comment: (NOTE) Calculated using the CKD-EPI Creatinine Equation (2021)    Anion gap 9 5 - 15    Comment: Performed at [facility], [address] [place], [place] [address]    Lab Results  Component Value Date   CREATININE 0.89 12/08/2022   CREATININE 0.69 05/13/2021   CREATININE 0.78 04/12/2021      Latest Ref Rng & Units 12/08/2022   12:06 AM 05/13/2021    5:46 AM 04/12/2021    9:12 AM  CMP  Glucose 70 - 99 mg/dL 132  440  102   BUN 8 - 23 mg/dL 38  22  21   Creatinine 0.44 - 1.00  mg/dL 7.25  3.66  4.40   Sodium 135 - 145 mmol/L 134  133  138   Potassium 3.5 - 5.1 mmol/L 3.6  3.1  5.3   Chloride 98 - 111 mmol/L 105  94  101   CO2 22 -  32 mmol/L 20  28  24    Calcium 8.9 - 10.3 mg/dL 9.7  16.1  09.6   Total Protein 6.5 - 8.1 g/dL 6.5   7.2   Total Bilirubin 0.3 - 1.2 mg/dL 0.4   0.5   Alkaline Phos 38 - 126 U/L 44   57   AST 15 - 41 U/L 19   16   ALT 0 - 44 U/L 15   16    Unresulted Labs (From admission, onward)     Start     Ordered   12/08/22 0500  Lipid panel  (Labs)  Tomorrow morning,   URGENT       Comments: Fasting    12/08/22 0449   12/08/22 0446  Urine rapid drug screen (hosp performed)not at [facility]  (Labs)  Once,   STAT        12/08/22 0449   12/08/22 0446  Hemoglobin A1c  (Labs)  Once,   URGENT       Comments: To assess prior glycemic control    12/08/22 0449           Pt has received : Orders Placed This Encounter  Procedures   MR BRAIN WO CONTRAST    Standing Status:   Standing    Number of Occurrences:   1    Order Specific Question:   What is the patient's sedation requirement?    Answer:   No Sedation    Order Specific Question:   Does the patient have a pacemaker or implanted devices?    Answer:   No    Order Specific Question:   Radiology Contrast Protocol - do NOT remove file path    Answer:   [email or web address]   MR MRA NECK W CONTRAST    Standing Status:   Standing    Number of Occurrences:   1    Order Specific Question:   If indicated for the ordered procedure, I authorize the administration of contrast media per Radiology protocol    Answer:   Yes    Order Specific Question:   What is the patient's sedation requirement?    Answer:   No Sedation    Order Specific Question:   Does the patient have a pacemaker or implanted devices?    Answer:   No   MR ANGIO HEAD WO CONTRAST    Standing Status:   Standing    Number of Occurrences:   1    Order Specific Question:   What is the  patient's sedation requirement?    Answer:   No Sedation    Order Specific Question:   Does the patient have a pacemaker or implanted devices?    Answer:   No   CBC    Standing Status:   Standing    Number of Occurrences:   1   Comprehensive metabolic panel    Standing Status:   Standing    Number of Occurrences:   1   Urine rapid drug screen (hosp performed)not at [facility]    Standing Status:   Standing    Number of Occurrences:   1   Lipid panel    Fasting    Standing Status:   Standing    Number of Occurrences:   1   Hemoglobin A1c    To assess prior glycemic control    Standing Status:   Standing    Number of Occurrences:   1   NIHSS score documentation NIHSS score range: 0-42  NIHSS score range: 0-42    Standing Status:   Standing    Number of Occurrences:   1   Vital signs    Standing Status:   Standing    Number of Occurrences:   1   Notify physician (specify)    Standing Status:   Standing    Number of Occurrences:   20    Order Specific Question:   Notify Physician    Answer:   change in neurologic status based on Neurologic Worsening protocol    Order Specific Question:   Notify Physician    Answer:   new onset dysrhythmia    Order Specific Question:   Notify Physician    Answer:   Systolic BP > 180 (not controlled by PRN continuous infusion medications) or less than 100    Order Specific Question:   Notify Physician    Answer:   Pox < 88% despite the use of supplemental O2 via nasal cannula    Order Specific Question:   Notify Physician    Answer:   Heart Rate > 120 or less than 50    Order Specific Question:   Notify Physician    Answer:   Respiratory Rate > 30    Order Specific Question:   Notify Physician    Answer:   Blood Glucose > 180mg /dl or less than 60mg /dl    Order Specific Question:   Notify Physician    Answer:   Temperature goal of 37 C (98.6 F). Acceptable range: 36.5 C - 37.5 C (97.7 F - 99.5 F)    Order Specific Question:   Notify Physician     Answer:   Urine output > 300 ml in 1 hour or less than 30 ml in 4 hours (unless anuric)   OOB with assistance    Standing Status:   Standing    Number of Occurrences:   [phone]   Swallow screen - If patient does NOT pass this screen, place order for SLP eval and treat (SLP2) - swallowing evaluation (BSE, MBS and/or diet order as indicated)    - If patient does NOT pass this screen, place order for SLP eval and treat (SLP2) - swallowing evaluation (BSE, MBS and/or diet order as indicated)    Standing Status:   Standing    Number of Occurrences:   1   NIH stroke scale    Document NIHSS on arrival to unit and each shift    Standing Status:   Standing    Number of Occurrences:   1   Intake and output    Avoid use of indwelling catheter    Standing Status:   Standing    Number of Occurrences:   1   Cardiac Monitoring Continuous x 24 hours Indications for use: Acute neurological event    Standing Status:   Standing    Number of Occurrences:   1    Order Specific Question:   Indications for use:    Answer:   Acute neurological event   Apply Stroke Care Plan: Ischemic Stroke, TIA    Standing Status:   Standing    Number of Occurrences:   1   Discuss with patient and document patient's goals for stroke risk factor reduction    Standing Status:   Standing    Number of Occurrences:   1   Initiate Oral Care Protocol    Standing Status:   Standing    Number of Occurrences:   1  Initiate Carrier Fluid Protocol    Standing Status:   Standing    Number of Occurrences:   1   Provide stroke education material to patient and family.    Standing Status:   Standing    Number of Occurrences:   1   Nurse to provide smoking / tobacco cessation education    Standing Status:   Standing    Number of Occurrences:   1   If the patient has passed the Stroke Swallow Screen or has a feeding tube, then RN may order General Admission PRN Orders (through manage orders) for the following patient needs: allergy  symptoms (Claritin), cold sores (Carmex), cough (Robitussin DM), eye irritation (Liquifilm Tears), hemorrhoids (Tucks), indigestion (Maalox), minor skin irritation (hydrocortisone cream), muscle pain Romeo Apple Gay), nose irritation (saline nasal spray) and sore throat (Chloraseptic spray).    Standing Status:   Standing    Number of Occurrences:   [phone]   Full code    Standing Status:   Standing    Number of Occurrences:   1    Order Specific Question:   By:    Answer:   Other   Consult to hospitalist    Standing Status:   Standing    Number of Occurrences:   1    Order Specific Question:   Place call to:    Answer:   hospitalist    Order Specific Question:   Reason for Consult    Answer:   Admit    Order Specific Question:   Diagnosis/Clinical Info for Consult:    Answer:   syncope   Consult to Registered Dietitian    Standing Status:   Standing    Number of Occurrences:   1    Order Specific Question:   Reason for consult?    Answer:   Assessment of nutrition requirements/status   Consult to Transition of Care Team    Standing Status:   Standing    Number of Occurrences:   1    Order Specific Question:   Reason for Consult:    Answer:   Home Health / DME Needs    Order Specific Question:   Reason for Consult:    Answer:   SNF placement   OT eval and treat    Standing Status:   Standing    Number of Occurrences:   1   PT eval and treat    Standing Status:   Standing    Number of Occurrences:   1   Oxygen therapy Mode or (Route): Nasal cannula; Liters Per Minute: 2; Keep 02 saturation: greater than 94 %    Standing Status:   Standing    Number of Occurrences:   1    Order Specific Question:   Mode or (Route)    Answer:   Nasal cannula    Order Specific Question:   Liters Per Minute    Answer:   2    Order Specific Question:   Keep 02 saturation    Answer:   greater than 94 %   SLP eval and treat Reason for evaluation: Cognitive/Language evaluation    Standing Status:    Standing    Number of Occurrences:   1    Order Specific Question:   Reason for evaluation    Answer:   Cognitive/Language evaluation   EKG 12-Lead    Standing Status:   Standing    Number of Occurrences:   1   ECHOCARDIOGRAM COMPLETE  NEEDS BUBBLE STUDY    Standing Status:   Standing    Number of Occurrences:   1    Order Specific Question:   Perflutren DEFINITY (image enhancing agent) should be administered unless hypersensitivity or allergy exist    Answer:   Administer Perflutren    Order Specific Question:   Reason for exam-Echo    Answer:   Syncope R55   Admit to Inpatient (patient's expected length of stay will be greater than 2 midnights or inpatient only procedure)    Standing Status:   Standing    Number of Occurrences:   1    Order Specific Question:   Hospital Area    Answer:   [facility] [[id number]]    Order Specific Question:   Level of Care    Answer:   Telemetry Medical [104]    Order Specific Question:   Covid Evaluation    Answer:   Asymptomatic - no recent exposure (last 10 days) testing not required    Order Specific Question:   Diagnosis    Answer:   Syncope and collapse [780.2.ICD-9-CM]    Order Specific Question:   Admitting Physician    Answer:   Darrold Junker    Order Specific Question:   Attending Physician    Answer:   Darrold Junker    Order Specific Question:   Certification:    Answer:   I certify this patient will need inpatient services for at least 2 midnights    Order Specific Question:   Estimated Length of Stay    Answer:   2   Fall precautions    Standing Status:   Standing    Number of Occurrences:   1   Aspiration precautions    Standing Status:   Standing    Number of Occurrences:   1   Seizure precautions    Standing Status:   Standing    Number of Occurrences:   1    Meds ordered this encounter  Medications   aspirin EC tablet 325 mg   atorvastatin (LIPITOR) tablet 40 mg   latanoprost  (XALATAN) 0.005 % ophthalmic solution 1 drop   mirabegron ER (MYRBETRIQ) tablet 50 mg   metoprolol succinate (TOPROL-XL) 24 hr tablet 25 mg    stroke: early stages of recovery book   0.9 %  sodium chloride infusion   OR Linked Order Group    acetaminophen (TYLENOL) tablet 650 mg    acetaminophen (TYLENOL) 160 MG/5ML solution 650 mg    acetaminophen (TYLENOL) suppository 650 mg   heparin injection 5,000 Units    Admission Imaging : No results found. Physical Examination: Vitals:   12/08/22 0230 12/08/22 0300 12/08/22 0330 12/08/22 0400  BP: (!) 108/56 (!) 112/56 (!) 122/58 (!) 98/53  Pulse: (!) 58 (!) 54 (!) 56 (!) 52  Temp:      Resp: 16 (!) 23 13 15   Height:      Weight:      SpO2: 95% 97% 97% 94%  TempSrc:      BMI (Calculated):       Physical Exam Vitals and nursing note reviewed.  Constitutional:      General: She is not in acute distress.    Appearance: Normal appearance. She is not ill-appearing, toxic-appearing or diaphoretic.  HENT:     Head: Normocephalic and atraumatic.     Right Ear: Hearing and external ear normal.     Left Ear: Hearing and  external ear normal.     Nose: Nose normal. No nasal deformity.     Mouth/Throat:     Lips: Pink.     Mouth: Mucous membranes are moist.     Tongue: No lesions.     Pharynx: Oropharynx is clear.  Eyes:     Extraocular Movements: Extraocular movements intact.     Pupils: Pupils are equal, round, and reactive to light.  Neck:     Vascular: No carotid bruit.  Cardiovascular:     Rate and Rhythm: Normal rate and regular rhythm.     Pulses: Normal pulses.     Heart sounds: Normal heart sounds.  Pulmonary:     Effort: Pulmonary effort is normal.     Breath sounds: Normal breath sounds.  Abdominal:     General: Bowel sounds are normal. There is no distension.     Palpations: Abdomen is soft. There is no mass.     Tenderness: There is no abdominal tenderness. There is no guarding.     Hernia: No hernia is present.   Musculoskeletal:     Right lower leg: No edema.     Left lower leg: No edema.  Skin:    General: Skin is warm.  Neurological:     General: No focal deficit present.     Mental Status: She is alert and oriented to person, place, and time.     Cranial Nerves: Cranial nerves 2-12 are intact.     Motor: Motor function is intact.  Psychiatric:        Attention and Perception: Attention normal.        Mood and Affect: Mood normal.        Speech: Speech normal.        Behavior: Behavior normal. Behavior is cooperative.        Cognition and Memory: Cognition normal.     Assessment and Plan: * Syncope and collapse Patient presenting with syncopal episodes. No CT on admission.  We will proceed with MRI. Will obtain an MRI and an MRA of the head and neck. Neurology consult as deemed appropriate. 2D echocardiogram with bubble study. Patient to continue aspirin 81. A.m. lipid panel.   Carotid artery stenosis Patient's on aspirin 325 Lipitor 40 and metoprolol 25 mg. Which we will continue.  Essential (primary) hypertension Vitals:   12/08/22 0005 12/08/22 0030 12/08/22 0100 12/08/22 0230  BP: (!) 143/75 117/78 (!) 128/57 (!) 108/56   12/08/22 0300 12/08/22 0330 12/08/22 0400  BP: (!) 112/56 (!) 122/58 (!) 98/53  Will lower patient's blood pressure medication dose. Once med rec is available patient will need to discontinue HCTZ.   Gastroesophageal reflux disease IV PPI therapy.   DVT prophylaxis:  HEPARIN   Code Status:  Full Code.      12/08/2022   12:03 AM  Advanced Directives  Does Patient Have a Medical Advance Directive? Yes    Family Communication:  None.   Emergency Contact: Contact Information     Name Relation Home Work [place] Son   [phone]   Tamma, Mandell   [phone]   Rosamarie, Mady   [phone]       Disposition Plan:   Home.  Consults: None.  Admission status: Inpatient.    Unit / Expected LOS:  Med  tele/ 2 days   Gertha Calkin MD [facility]  6 PM- 2 AM. [phone]( Pager )  For questions regarding this patient please use [email or web address] to contact the current  [facility] MD.   Bonita Quin may also call [phone] to contact current Assigned [facility] Attending/Consulting MD for this patient.

## 2022-12-08 NOTE — ED Notes (Signed)
Pt assisted to [facility], pt did report dizziness. X1 void. Pt back in bed.

## 2022-12-10 DIAGNOSIS — Z961 Presence of intraocular lens: Secondary | ICD-10-CM | POA: Diagnosis not present

## 2022-12-10 DIAGNOSIS — H02105 Unspecified ectropion of left lower eyelid: Secondary | ICD-10-CM | POA: Diagnosis not present

## 2022-12-10 DIAGNOSIS — H401131 Primary open-angle glaucoma, bilateral, mild stage: Secondary | ICD-10-CM | POA: Diagnosis not present

## 2022-12-11 DIAGNOSIS — I10 Essential (primary) hypertension: Secondary | ICD-10-CM | POA: Diagnosis not present

## 2022-12-11 DIAGNOSIS — I951 Orthostatic hypotension: Secondary | ICD-10-CM | POA: Diagnosis not present

## 2022-12-11 DIAGNOSIS — I351 Nonrheumatic aortic (valve) insufficiency: Secondary | ICD-10-CM | POA: Diagnosis not present

## 2022-12-11 DIAGNOSIS — I639 Cerebral infarction, unspecified: Secondary | ICD-10-CM | POA: Diagnosis not present

## 2022-12-11 DIAGNOSIS — I491 Atrial premature depolarization: Secondary | ICD-10-CM | POA: Diagnosis not present

## 2022-12-11 DIAGNOSIS — I071 Rheumatic tricuspid insufficiency: Secondary | ICD-10-CM | POA: Diagnosis not present

## 2022-12-11 DIAGNOSIS — I6522 Occlusion and stenosis of left carotid artery: Secondary | ICD-10-CM | POA: Diagnosis not present

## 2022-12-23 DIAGNOSIS — I6523 Occlusion and stenosis of bilateral carotid arteries: Secondary | ICD-10-CM | POA: Diagnosis not present

## 2022-12-24 DIAGNOSIS — I6522 Occlusion and stenosis of left carotid artery: Secondary | ICD-10-CM | POA: Diagnosis not present

## 2022-12-26 DIAGNOSIS — E782 Mixed hyperlipidemia: Secondary | ICD-10-CM | POA: Diagnosis not present

## 2022-12-26 DIAGNOSIS — I6523 Occlusion and stenosis of bilateral carotid arteries: Secondary | ICD-10-CM | POA: Diagnosis not present

## 2022-12-26 DIAGNOSIS — I10 Essential (primary) hypertension: Secondary | ICD-10-CM | POA: Diagnosis not present

## 2023-02-21 DIAGNOSIS — K219 Gastro-esophageal reflux disease without esophagitis: Secondary | ICD-10-CM | POA: Diagnosis not present

## 2023-02-21 DIAGNOSIS — I10 Essential (primary) hypertension: Secondary | ICD-10-CM | POA: Diagnosis not present

## 2023-02-21 DIAGNOSIS — R7303 Prediabetes: Secondary | ICD-10-CM | POA: Diagnosis not present

## 2023-02-21 DIAGNOSIS — R2681 Unsteadiness on feet: Secondary | ICD-10-CM | POA: Diagnosis not present

## 2023-02-21 DIAGNOSIS — I671 Cerebral aneurysm, nonruptured: Secondary | ICD-10-CM | POA: Diagnosis not present

## 2023-02-21 DIAGNOSIS — I6522 Occlusion and stenosis of left carotid artery: Secondary | ICD-10-CM | POA: Diagnosis not present

## 2023-02-21 DIAGNOSIS — E782 Mixed hyperlipidemia: Secondary | ICD-10-CM | POA: Diagnosis not present

## 2023-02-21 DIAGNOSIS — Z01818 Encounter for other preprocedural examination: Secondary | ICD-10-CM | POA: Diagnosis not present

## 2023-02-26 DIAGNOSIS — R001 Bradycardia, unspecified: Secondary | ICD-10-CM | POA: Diagnosis not present

## 2023-02-26 DIAGNOSIS — K219 Gastro-esophageal reflux disease without esophagitis: Secondary | ICD-10-CM | POA: Diagnosis not present

## 2023-02-26 DIAGNOSIS — I351 Nonrheumatic aortic (valve) insufficiency: Secondary | ICD-10-CM | POA: Diagnosis not present

## 2023-02-26 DIAGNOSIS — I272 Pulmonary hypertension, unspecified: Secondary | ICD-10-CM | POA: Diagnosis not present

## 2023-02-26 DIAGNOSIS — Z7982 Long term (current) use of aspirin: Secondary | ICD-10-CM | POA: Diagnosis not present

## 2023-02-26 DIAGNOSIS — Z8673 Personal history of transient ischemic attack (TIA), and cerebral infarction without residual deficits: Secondary | ICD-10-CM | POA: Diagnosis not present

## 2023-02-26 DIAGNOSIS — E119 Type 2 diabetes mellitus without complications: Secondary | ICD-10-CM | POA: Diagnosis not present

## 2023-02-26 DIAGNOSIS — E001 Congenital iodine-deficiency syndrome, myxedematous type: Secondary | ICD-10-CM | POA: Diagnosis not present

## 2023-02-26 DIAGNOSIS — D62 Acute posthemorrhagic anemia: Secondary | ICD-10-CM | POA: Diagnosis not present

## 2023-02-26 DIAGNOSIS — Z7902 Long term (current) use of antithrombotics/antiplatelets: Secondary | ICD-10-CM | POA: Diagnosis not present

## 2023-02-26 DIAGNOSIS — I071 Rheumatic tricuspid insufficiency: Secondary | ICD-10-CM | POA: Diagnosis not present

## 2023-02-26 DIAGNOSIS — E785 Hyperlipidemia, unspecified: Secondary | ICD-10-CM | POA: Diagnosis not present

## 2023-02-26 DIAGNOSIS — I10 Essential (primary) hypertension: Secondary | ICD-10-CM | POA: Diagnosis not present

## 2023-02-26 DIAGNOSIS — I6522 Occlusion and stenosis of left carotid artery: Secondary | ICD-10-CM | POA: Diagnosis not present

## 2023-02-26 DIAGNOSIS — Z90710 Acquired absence of both cervix and uterus: Secondary | ICD-10-CM | POA: Diagnosis not present

## 2023-02-26 DIAGNOSIS — I959 Hypotension, unspecified: Secondary | ICD-10-CM | POA: Diagnosis not present

## 2023-03-01 DIAGNOSIS — R001 Bradycardia, unspecified: Secondary | ICD-10-CM | POA: Diagnosis not present

## 2023-03-04 DIAGNOSIS — I6522 Occlusion and stenosis of left carotid artery: Secondary | ICD-10-CM | POA: Diagnosis not present

## 2023-03-04 DIAGNOSIS — I10 Essential (primary) hypertension: Secondary | ICD-10-CM | POA: Diagnosis not present

## 2023-03-04 DIAGNOSIS — I491 Atrial premature depolarization: Secondary | ICD-10-CM | POA: Diagnosis not present

## 2023-03-04 DIAGNOSIS — I6529 Occlusion and stenosis of unspecified carotid artery: Secondary | ICD-10-CM | POA: Diagnosis not present

## 2023-03-04 DIAGNOSIS — E782 Mixed hyperlipidemia: Secondary | ICD-10-CM | POA: Diagnosis not present

## 2023-03-04 DIAGNOSIS — I639 Cerebral infarction, unspecified: Secondary | ICD-10-CM | POA: Diagnosis not present

## 2023-03-04 DIAGNOSIS — Z09 Encounter for follow-up examination after completed treatment for conditions other than malignant neoplasm: Secondary | ICD-10-CM | POA: Diagnosis not present

## 2023-03-04 DIAGNOSIS — I351 Nonrheumatic aortic (valve) insufficiency: Secondary | ICD-10-CM | POA: Diagnosis not present

## 2023-03-04 DIAGNOSIS — I498 Other specified cardiac arrhythmias: Secondary | ICD-10-CM | POA: Diagnosis not present

## 2023-03-04 DIAGNOSIS — R600 Localized edema: Secondary | ICD-10-CM | POA: Diagnosis not present

## 2023-03-05 DIAGNOSIS — I6522 Occlusion and stenosis of left carotid artery: Secondary | ICD-10-CM | POA: Diagnosis not present

## 2023-04-01 DIAGNOSIS — Z95828 Presence of other vascular implants and grafts: Secondary | ICD-10-CM | POA: Diagnosis not present

## 2023-04-01 DIAGNOSIS — Z48812 Encounter for surgical aftercare following surgery on the circulatory system: Secondary | ICD-10-CM | POA: Diagnosis not present

## 2023-04-01 DIAGNOSIS — I6522 Occlusion and stenosis of left carotid artery: Secondary | ICD-10-CM | POA: Diagnosis not present

## 2023-04-01 DIAGNOSIS — E785 Hyperlipidemia, unspecified: Secondary | ICD-10-CM | POA: Diagnosis not present

## 2023-04-01 DIAGNOSIS — Z7982 Long term (current) use of aspirin: Secondary | ICD-10-CM | POA: Diagnosis not present

## 2023-04-01 DIAGNOSIS — Z7902 Long term (current) use of antithrombotics/antiplatelets: Secondary | ICD-10-CM | POA: Diagnosis not present

## 2023-04-01 DIAGNOSIS — I10 Essential (primary) hypertension: Secondary | ICD-10-CM | POA: Diagnosis not present

## 2023-04-15 DIAGNOSIS — Z23 Encounter for immunization: Secondary | ICD-10-CM | POA: Diagnosis not present

## 2023-04-15 DIAGNOSIS — E782 Mixed hyperlipidemia: Secondary | ICD-10-CM | POA: Diagnosis not present

## 2023-04-15 DIAGNOSIS — R351 Nocturia: Secondary | ICD-10-CM | POA: Diagnosis not present

## 2023-04-15 DIAGNOSIS — I6522 Occlusion and stenosis of left carotid artery: Secondary | ICD-10-CM | POA: Diagnosis not present

## 2023-04-15 DIAGNOSIS — I10 Essential (primary) hypertension: Secondary | ICD-10-CM | POA: Diagnosis not present

## 2023-04-15 DIAGNOSIS — I671 Cerebral aneurysm, nonruptured: Secondary | ICD-10-CM | POA: Diagnosis not present

## 2023-04-15 DIAGNOSIS — R2681 Unsteadiness on feet: Secondary | ICD-10-CM | POA: Diagnosis not present

## 2023-04-15 DIAGNOSIS — R7303 Prediabetes: Secondary | ICD-10-CM | POA: Diagnosis not present

## 2023-04-30 DIAGNOSIS — K219 Gastro-esophageal reflux disease without esophagitis: Secondary | ICD-10-CM | POA: Diagnosis present

## 2023-04-30 DIAGNOSIS — I6521 Occlusion and stenosis of right carotid artery: Secondary | ICD-10-CM | POA: Diagnosis not present

## 2023-04-30 DIAGNOSIS — R339 Retention of urine, unspecified: Secondary | ICD-10-CM | POA: Diagnosis not present

## 2023-04-30 DIAGNOSIS — M8580 Other specified disorders of bone density and structure, unspecified site: Secondary | ICD-10-CM | POA: Diagnosis not present

## 2023-04-30 DIAGNOSIS — I6523 Occlusion and stenosis of bilateral carotid arteries: Secondary | ICD-10-CM | POA: Diagnosis present

## 2023-04-30 DIAGNOSIS — M4319 Spondylolisthesis, multiple sites in spine: Secondary | ICD-10-CM | POA: Diagnosis not present

## 2023-04-30 DIAGNOSIS — I491 Atrial premature depolarization: Secondary | ICD-10-CM | POA: Diagnosis not present

## 2023-04-30 DIAGNOSIS — G4089 Other seizures: Secondary | ICD-10-CM | POA: Diagnosis not present

## 2023-04-30 DIAGNOSIS — Z8673 Personal history of transient ischemic attack (TIA), and cerebral infarction without residual deficits: Secondary | ICD-10-CM | POA: Diagnosis not present

## 2023-04-30 DIAGNOSIS — M47816 Spondylosis without myelopathy or radiculopathy, lumbar region: Secondary | ICD-10-CM | POA: Diagnosis not present

## 2023-04-30 DIAGNOSIS — M79662 Pain in left lower leg: Secondary | ICD-10-CM | POA: Diagnosis not present

## 2023-04-30 DIAGNOSIS — R55 Syncope and collapse: Secondary | ICD-10-CM | POA: Diagnosis not present

## 2023-04-30 DIAGNOSIS — Z95820 Peripheral vascular angioplasty status with implants and grafts: Secondary | ICD-10-CM | POA: Diagnosis not present

## 2023-04-30 DIAGNOSIS — R54 Age-related physical debility: Secondary | ICD-10-CM | POA: Diagnosis present

## 2023-04-30 DIAGNOSIS — M25572 Pain in left ankle and joints of left foot: Secondary | ICD-10-CM | POA: Diagnosis present

## 2023-04-30 DIAGNOSIS — E782 Mixed hyperlipidemia: Secondary | ICD-10-CM | POA: Diagnosis present

## 2023-04-30 DIAGNOSIS — R569 Unspecified convulsions: Secondary | ICD-10-CM | POA: Diagnosis present

## 2023-04-30 DIAGNOSIS — Z7982 Long term (current) use of aspirin: Secondary | ICD-10-CM | POA: Diagnosis not present

## 2023-04-30 DIAGNOSIS — I951 Orthostatic hypotension: Secondary | ICD-10-CM | POA: Diagnosis present

## 2023-04-30 DIAGNOSIS — R29898 Other symptoms and signs involving the musculoskeletal system: Secondary | ICD-10-CM | POA: Diagnosis not present

## 2023-04-30 DIAGNOSIS — G9389 Other specified disorders of brain: Secondary | ICD-10-CM | POA: Diagnosis present

## 2023-04-30 DIAGNOSIS — S0083XA Contusion of other part of head, initial encounter: Secondary | ICD-10-CM | POA: Diagnosis not present

## 2023-04-30 DIAGNOSIS — Z7902 Long term (current) use of antithrombotics/antiplatelets: Secondary | ICD-10-CM | POA: Diagnosis not present

## 2023-04-30 DIAGNOSIS — S0990XA Unspecified injury of head, initial encounter: Secondary | ICD-10-CM | POA: Diagnosis not present

## 2023-04-30 DIAGNOSIS — M47812 Spondylosis without myelopathy or radiculopathy, cervical region: Secondary | ICD-10-CM | POA: Diagnosis not present

## 2023-04-30 DIAGNOSIS — M5136 Other intervertebral disc degeneration, lumbar region: Secondary | ICD-10-CM | POA: Diagnosis not present

## 2023-04-30 DIAGNOSIS — M4312 Spondylolisthesis, cervical region: Secondary | ICD-10-CM | POA: Diagnosis not present

## 2023-04-30 DIAGNOSIS — S99912A Unspecified injury of left ankle, initial encounter: Secondary | ICD-10-CM | POA: Diagnosis not present

## 2023-04-30 DIAGNOSIS — Z136 Encounter for screening for cardiovascular disorders: Secondary | ICD-10-CM | POA: Diagnosis not present

## 2023-04-30 DIAGNOSIS — M4316 Spondylolisthesis, lumbar region: Secondary | ICD-10-CM | POA: Diagnosis not present

## 2023-04-30 DIAGNOSIS — M4807 Spinal stenosis, lumbosacral region: Secondary | ICD-10-CM | POA: Diagnosis not present

## 2023-04-30 DIAGNOSIS — R531 Weakness: Secondary | ICD-10-CM | POA: Diagnosis not present

## 2023-04-30 DIAGNOSIS — R251 Tremor, unspecified: Secondary | ICD-10-CM | POA: Diagnosis present

## 2023-04-30 DIAGNOSIS — I671 Cerebral aneurysm, nonruptured: Secondary | ICD-10-CM | POA: Diagnosis not present

## 2023-04-30 DIAGNOSIS — I10 Essential (primary) hypertension: Secondary | ICD-10-CM | POA: Diagnosis present

## 2023-04-30 DIAGNOSIS — I082 Rheumatic disorders of both aortic and tricuspid valves: Secondary | ICD-10-CM | POA: Diagnosis present

## 2023-05-01 DIAGNOSIS — R29898 Other symptoms and signs involving the musculoskeletal system: Secondary | ICD-10-CM | POA: Diagnosis present

## 2023-05-01 DIAGNOSIS — M25572 Pain in left ankle and joints of left foot: Secondary | ICD-10-CM | POA: Diagnosis present

## 2023-05-01 DIAGNOSIS — I491 Atrial premature depolarization: Secondary | ICD-10-CM | POA: Diagnosis present

## 2023-05-01 DIAGNOSIS — I082 Rheumatic disorders of both aortic and tricuspid valves: Secondary | ICD-10-CM | POA: Diagnosis present

## 2023-05-01 DIAGNOSIS — S0083XA Contusion of other part of head, initial encounter: Secondary | ICD-10-CM | POA: Diagnosis not present

## 2023-05-01 DIAGNOSIS — K219 Gastro-esophageal reflux disease without esophagitis: Secondary | ICD-10-CM | POA: Diagnosis present

## 2023-05-01 DIAGNOSIS — E782 Mixed hyperlipidemia: Secondary | ICD-10-CM | POA: Diagnosis present

## 2023-05-01 DIAGNOSIS — I6523 Occlusion and stenosis of bilateral carotid arteries: Secondary | ICD-10-CM | POA: Diagnosis present

## 2023-05-01 DIAGNOSIS — M4319 Spondylolisthesis, multiple sites in spine: Secondary | ICD-10-CM | POA: Diagnosis not present

## 2023-05-01 DIAGNOSIS — R54 Age-related physical debility: Secondary | ICD-10-CM | POA: Diagnosis present

## 2023-05-01 DIAGNOSIS — Z7982 Long term (current) use of aspirin: Secondary | ICD-10-CM | POA: Diagnosis not present

## 2023-05-01 DIAGNOSIS — I951 Orthostatic hypotension: Secondary | ICD-10-CM | POA: Diagnosis present

## 2023-05-01 DIAGNOSIS — I10 Essential (primary) hypertension: Secondary | ICD-10-CM | POA: Diagnosis present

## 2023-05-01 DIAGNOSIS — Z95820 Peripheral vascular angioplasty status with implants and grafts: Secondary | ICD-10-CM | POA: Diagnosis not present

## 2023-05-01 DIAGNOSIS — R251 Tremor, unspecified: Secondary | ICD-10-CM | POA: Diagnosis present

## 2023-05-01 DIAGNOSIS — R569 Unspecified convulsions: Secondary | ICD-10-CM | POA: Diagnosis present

## 2023-05-01 DIAGNOSIS — I6521 Occlusion and stenosis of right carotid artery: Secondary | ICD-10-CM | POA: Diagnosis not present

## 2023-05-01 DIAGNOSIS — R55 Syncope and collapse: Secondary | ICD-10-CM | POA: Diagnosis not present

## 2023-05-01 DIAGNOSIS — R531 Weakness: Secondary | ICD-10-CM | POA: Diagnosis not present

## 2023-05-01 DIAGNOSIS — I671 Cerebral aneurysm, nonruptured: Secondary | ICD-10-CM | POA: Diagnosis not present

## 2023-05-01 DIAGNOSIS — G9389 Other specified disorders of brain: Secondary | ICD-10-CM | POA: Diagnosis present

## 2023-05-01 DIAGNOSIS — M79662 Pain in left lower leg: Secondary | ICD-10-CM | POA: Diagnosis not present

## 2023-05-01 DIAGNOSIS — Z8673 Personal history of transient ischemic attack (TIA), and cerebral infarction without residual deficits: Secondary | ICD-10-CM | POA: Diagnosis not present

## 2023-05-01 DIAGNOSIS — M4807 Spinal stenosis, lumbosacral region: Secondary | ICD-10-CM | POA: Diagnosis not present

## 2023-05-01 DIAGNOSIS — M47816 Spondylosis without myelopathy or radiculopathy, lumbar region: Secondary | ICD-10-CM | POA: Diagnosis not present

## 2023-05-01 DIAGNOSIS — M5136 Other intervertebral disc degeneration, lumbar region: Secondary | ICD-10-CM | POA: Diagnosis not present

## 2023-05-01 DIAGNOSIS — Z7902 Long term (current) use of antithrombotics/antiplatelets: Secondary | ICD-10-CM | POA: Diagnosis not present

## 2023-05-01 DIAGNOSIS — S99912A Unspecified injury of left ankle, initial encounter: Secondary | ICD-10-CM | POA: Diagnosis not present

## 2023-05-05 DIAGNOSIS — I351 Nonrheumatic aortic (valve) insufficiency: Secondary | ICD-10-CM | POA: Diagnosis not present

## 2023-05-05 DIAGNOSIS — Z7982 Long term (current) use of aspirin: Secondary | ICD-10-CM | POA: Diagnosis not present

## 2023-05-05 DIAGNOSIS — I3481 Nonrheumatic mitral (valve) annulus calcification: Secondary | ICD-10-CM | POA: Diagnosis not present

## 2023-05-05 DIAGNOSIS — R569 Unspecified convulsions: Secondary | ICD-10-CM | POA: Diagnosis not present

## 2023-05-05 DIAGNOSIS — S0083XD Contusion of other part of head, subsequent encounter: Secondary | ICD-10-CM | POA: Diagnosis not present

## 2023-05-05 DIAGNOSIS — I491 Atrial premature depolarization: Secondary | ICD-10-CM | POA: Diagnosis not present

## 2023-05-05 DIAGNOSIS — M6281 Muscle weakness (generalized): Secondary | ICD-10-CM | POA: Diagnosis not present

## 2023-05-05 DIAGNOSIS — R55 Syncope and collapse: Secondary | ICD-10-CM | POA: Diagnosis not present

## 2023-05-05 DIAGNOSIS — S99912D Unspecified injury of left ankle, subsequent encounter: Secondary | ICD-10-CM | POA: Diagnosis not present

## 2023-05-05 DIAGNOSIS — E785 Hyperlipidemia, unspecified: Secondary | ICD-10-CM | POA: Diagnosis not present

## 2023-05-05 DIAGNOSIS — S0003XD Contusion of scalp, subsequent encounter: Secondary | ICD-10-CM | POA: Diagnosis not present

## 2023-05-05 DIAGNOSIS — R29898 Other symptoms and signs involving the musculoskeletal system: Secondary | ICD-10-CM | POA: Diagnosis not present

## 2023-05-05 DIAGNOSIS — Z8673 Personal history of transient ischemic attack (TIA), and cerebral infarction without residual deficits: Secondary | ICD-10-CM | POA: Diagnosis not present

## 2023-05-05 DIAGNOSIS — R531 Weakness: Secondary | ICD-10-CM | POA: Diagnosis not present

## 2023-05-05 DIAGNOSIS — I951 Orthostatic hypotension: Secondary | ICD-10-CM | POA: Diagnosis not present

## 2023-05-05 DIAGNOSIS — R561 Post traumatic seizures: Secondary | ICD-10-CM | POA: Diagnosis not present

## 2023-05-05 DIAGNOSIS — W01118S Fall on same level from slipping, tripping and stumbling with subsequent striking against other sharp object, sequela: Secondary | ICD-10-CM | POA: Diagnosis not present

## 2023-05-05 DIAGNOSIS — G9389 Other specified disorders of brain: Secondary | ICD-10-CM | POA: Diagnosis not present

## 2023-05-05 DIAGNOSIS — M25572 Pain in left ankle and joints of left foot: Secondary | ICD-10-CM | POA: Diagnosis not present

## 2023-05-05 DIAGNOSIS — I10 Essential (primary) hypertension: Secondary | ICD-10-CM | POA: Diagnosis not present

## 2023-05-05 DIAGNOSIS — R600 Localized edema: Secondary | ICD-10-CM | POA: Diagnosis not present

## 2023-05-16 DIAGNOSIS — I351 Nonrheumatic aortic (valve) insufficiency: Secondary | ICD-10-CM | POA: Diagnosis not present

## 2023-05-16 DIAGNOSIS — Z7902 Long term (current) use of antithrombotics/antiplatelets: Secondary | ICD-10-CM | POA: Diagnosis not present

## 2023-05-16 DIAGNOSIS — Z9181 History of falling: Secondary | ICD-10-CM | POA: Diagnosis not present

## 2023-05-16 DIAGNOSIS — Z95828 Presence of other vascular implants and grafts: Secondary | ICD-10-CM | POA: Diagnosis not present

## 2023-05-16 DIAGNOSIS — I491 Atrial premature depolarization: Secondary | ICD-10-CM | POA: Diagnosis not present

## 2023-05-16 DIAGNOSIS — S0003XD Contusion of scalp, subsequent encounter: Secondary | ICD-10-CM | POA: Diagnosis not present

## 2023-05-16 DIAGNOSIS — I10 Essential (primary) hypertension: Secondary | ICD-10-CM | POA: Diagnosis not present

## 2023-05-16 DIAGNOSIS — I951 Orthostatic hypotension: Secondary | ICD-10-CM | POA: Diagnosis not present

## 2023-05-16 DIAGNOSIS — Z96653 Presence of artificial knee joint, bilateral: Secondary | ICD-10-CM | POA: Diagnosis not present

## 2023-05-16 DIAGNOSIS — E785 Hyperlipidemia, unspecified: Secondary | ICD-10-CM | POA: Diagnosis not present

## 2023-05-16 DIAGNOSIS — Z8673 Personal history of transient ischemic attack (TIA), and cerebral infarction without residual deficits: Secondary | ICD-10-CM | POA: Diagnosis not present

## 2023-05-20 DIAGNOSIS — S0003XD Contusion of scalp, subsequent encounter: Secondary | ICD-10-CM | POA: Diagnosis not present

## 2023-05-20 DIAGNOSIS — I10 Essential (primary) hypertension: Secondary | ICD-10-CM | POA: Diagnosis not present

## 2023-05-20 DIAGNOSIS — I951 Orthostatic hypotension: Secondary | ICD-10-CM | POA: Diagnosis not present

## 2023-05-20 DIAGNOSIS — E785 Hyperlipidemia, unspecified: Secondary | ICD-10-CM | POA: Diagnosis not present

## 2023-05-20 DIAGNOSIS — I491 Atrial premature depolarization: Secondary | ICD-10-CM | POA: Diagnosis not present

## 2023-05-20 DIAGNOSIS — I351 Nonrheumatic aortic (valve) insufficiency: Secondary | ICD-10-CM | POA: Diagnosis not present

## 2023-05-22 DIAGNOSIS — I491 Atrial premature depolarization: Secondary | ICD-10-CM | POA: Diagnosis not present

## 2023-05-22 DIAGNOSIS — S0003XD Contusion of scalp, subsequent encounter: Secondary | ICD-10-CM | POA: Diagnosis not present

## 2023-05-22 DIAGNOSIS — I951 Orthostatic hypotension: Secondary | ICD-10-CM | POA: Diagnosis not present

## 2023-05-22 DIAGNOSIS — I351 Nonrheumatic aortic (valve) insufficiency: Secondary | ICD-10-CM | POA: Diagnosis not present

## 2023-05-22 DIAGNOSIS — E785 Hyperlipidemia, unspecified: Secondary | ICD-10-CM | POA: Diagnosis not present

## 2023-05-22 DIAGNOSIS — I10 Essential (primary) hypertension: Secondary | ICD-10-CM | POA: Diagnosis not present

## 2023-05-23 DIAGNOSIS — E785 Hyperlipidemia, unspecified: Secondary | ICD-10-CM | POA: Diagnosis not present

## 2023-05-23 DIAGNOSIS — I10 Essential (primary) hypertension: Secondary | ICD-10-CM | POA: Diagnosis not present

## 2023-05-23 DIAGNOSIS — I351 Nonrheumatic aortic (valve) insufficiency: Secondary | ICD-10-CM | POA: Diagnosis not present

## 2023-05-23 DIAGNOSIS — S0003XD Contusion of scalp, subsequent encounter: Secondary | ICD-10-CM | POA: Diagnosis not present

## 2023-05-23 DIAGNOSIS — I491 Atrial premature depolarization: Secondary | ICD-10-CM | POA: Diagnosis not present

## 2023-05-23 DIAGNOSIS — I951 Orthostatic hypotension: Secondary | ICD-10-CM | POA: Diagnosis not present

## 2023-05-27 DIAGNOSIS — I351 Nonrheumatic aortic (valve) insufficiency: Secondary | ICD-10-CM | POA: Diagnosis not present

## 2023-05-27 DIAGNOSIS — I10 Essential (primary) hypertension: Secondary | ICD-10-CM | POA: Diagnosis not present

## 2023-05-27 DIAGNOSIS — I491 Atrial premature depolarization: Secondary | ICD-10-CM | POA: Diagnosis not present

## 2023-05-27 DIAGNOSIS — S0003XD Contusion of scalp, subsequent encounter: Secondary | ICD-10-CM | POA: Diagnosis not present

## 2023-05-27 DIAGNOSIS — I951 Orthostatic hypotension: Secondary | ICD-10-CM | POA: Diagnosis not present

## 2023-05-27 DIAGNOSIS — E785 Hyperlipidemia, unspecified: Secondary | ICD-10-CM | POA: Diagnosis not present

## 2023-05-30 DIAGNOSIS — I351 Nonrheumatic aortic (valve) insufficiency: Secondary | ICD-10-CM | POA: Diagnosis not present

## 2023-05-30 DIAGNOSIS — E785 Hyperlipidemia, unspecified: Secondary | ICD-10-CM | POA: Diagnosis not present

## 2023-05-30 DIAGNOSIS — I10 Essential (primary) hypertension: Secondary | ICD-10-CM | POA: Diagnosis not present

## 2023-05-30 DIAGNOSIS — I491 Atrial premature depolarization: Secondary | ICD-10-CM | POA: Diagnosis not present

## 2023-05-30 DIAGNOSIS — S0003XD Contusion of scalp, subsequent encounter: Secondary | ICD-10-CM | POA: Diagnosis not present

## 2023-05-30 DIAGNOSIS — I951 Orthostatic hypotension: Secondary | ICD-10-CM | POA: Diagnosis not present

## 2023-06-02 DIAGNOSIS — I491 Atrial premature depolarization: Secondary | ICD-10-CM | POA: Diagnosis not present

## 2023-06-02 DIAGNOSIS — I351 Nonrheumatic aortic (valve) insufficiency: Secondary | ICD-10-CM | POA: Diagnosis not present

## 2023-06-02 DIAGNOSIS — I10 Essential (primary) hypertension: Secondary | ICD-10-CM | POA: Diagnosis not present

## 2023-06-02 DIAGNOSIS — I951 Orthostatic hypotension: Secondary | ICD-10-CM | POA: Diagnosis not present

## 2023-06-02 DIAGNOSIS — E785 Hyperlipidemia, unspecified: Secondary | ICD-10-CM | POA: Diagnosis not present

## 2023-06-02 DIAGNOSIS — S0003XD Contusion of scalp, subsequent encounter: Secondary | ICD-10-CM | POA: Diagnosis not present

## 2023-06-08 DIAGNOSIS — S0003XD Contusion of scalp, subsequent encounter: Secondary | ICD-10-CM | POA: Diagnosis not present

## 2023-06-08 DIAGNOSIS — E785 Hyperlipidemia, unspecified: Secondary | ICD-10-CM | POA: Diagnosis not present

## 2023-06-08 DIAGNOSIS — I491 Atrial premature depolarization: Secondary | ICD-10-CM | POA: Diagnosis not present

## 2023-06-08 DIAGNOSIS — I351 Nonrheumatic aortic (valve) insufficiency: Secondary | ICD-10-CM | POA: Diagnosis not present

## 2023-06-08 DIAGNOSIS — I10 Essential (primary) hypertension: Secondary | ICD-10-CM | POA: Diagnosis not present

## 2023-06-08 DIAGNOSIS — I951 Orthostatic hypotension: Secondary | ICD-10-CM | POA: Diagnosis not present

## 2023-06-12 DIAGNOSIS — I951 Orthostatic hypotension: Secondary | ICD-10-CM | POA: Diagnosis not present

## 2023-06-12 DIAGNOSIS — S0003XD Contusion of scalp, subsequent encounter: Secondary | ICD-10-CM | POA: Diagnosis not present

## 2023-06-12 DIAGNOSIS — I10 Essential (primary) hypertension: Secondary | ICD-10-CM | POA: Diagnosis not present

## 2023-06-12 DIAGNOSIS — E785 Hyperlipidemia, unspecified: Secondary | ICD-10-CM | POA: Diagnosis not present

## 2023-06-12 DIAGNOSIS — I351 Nonrheumatic aortic (valve) insufficiency: Secondary | ICD-10-CM | POA: Diagnosis not present

## 2023-06-12 DIAGNOSIS — I491 Atrial premature depolarization: Secondary | ICD-10-CM | POA: Diagnosis not present

## 2023-06-15 DIAGNOSIS — Z7902 Long term (current) use of antithrombotics/antiplatelets: Secondary | ICD-10-CM | POA: Diagnosis not present

## 2023-06-15 DIAGNOSIS — Z96653 Presence of artificial knee joint, bilateral: Secondary | ICD-10-CM | POA: Diagnosis not present

## 2023-06-15 DIAGNOSIS — I951 Orthostatic hypotension: Secondary | ICD-10-CM | POA: Diagnosis not present

## 2023-06-15 DIAGNOSIS — E785 Hyperlipidemia, unspecified: Secondary | ICD-10-CM | POA: Diagnosis not present

## 2023-06-15 DIAGNOSIS — S0003XD Contusion of scalp, subsequent encounter: Secondary | ICD-10-CM | POA: Diagnosis not present

## 2023-06-15 DIAGNOSIS — I10 Essential (primary) hypertension: Secondary | ICD-10-CM | POA: Diagnosis not present

## 2023-06-15 DIAGNOSIS — Z95828 Presence of other vascular implants and grafts: Secondary | ICD-10-CM | POA: Diagnosis not present

## 2023-06-15 DIAGNOSIS — Z8673 Personal history of transient ischemic attack (TIA), and cerebral infarction without residual deficits: Secondary | ICD-10-CM | POA: Diagnosis not present

## 2023-06-15 DIAGNOSIS — I351 Nonrheumatic aortic (valve) insufficiency: Secondary | ICD-10-CM | POA: Diagnosis not present

## 2023-06-15 DIAGNOSIS — I491 Atrial premature depolarization: Secondary | ICD-10-CM | POA: Diagnosis not present

## 2023-06-15 DIAGNOSIS — Z9181 History of falling: Secondary | ICD-10-CM | POA: Diagnosis not present

## 2023-06-16 DIAGNOSIS — I10 Essential (primary) hypertension: Secondary | ICD-10-CM | POA: Diagnosis not present

## 2023-06-16 DIAGNOSIS — E785 Hyperlipidemia, unspecified: Secondary | ICD-10-CM | POA: Diagnosis not present

## 2023-06-16 DIAGNOSIS — S0003XD Contusion of scalp, subsequent encounter: Secondary | ICD-10-CM | POA: Diagnosis not present

## 2023-06-16 DIAGNOSIS — I951 Orthostatic hypotension: Secondary | ICD-10-CM | POA: Diagnosis not present

## 2023-06-16 DIAGNOSIS — I351 Nonrheumatic aortic (valve) insufficiency: Secondary | ICD-10-CM | POA: Diagnosis not present

## 2023-06-16 DIAGNOSIS — I491 Atrial premature depolarization: Secondary | ICD-10-CM | POA: Diagnosis not present

## 2023-06-17 DIAGNOSIS — S0003XD Contusion of scalp, subsequent encounter: Secondary | ICD-10-CM | POA: Diagnosis not present

## 2023-06-17 DIAGNOSIS — I351 Nonrheumatic aortic (valve) insufficiency: Secondary | ICD-10-CM | POA: Diagnosis not present

## 2023-06-17 DIAGNOSIS — I491 Atrial premature depolarization: Secondary | ICD-10-CM | POA: Diagnosis not present

## 2023-06-17 DIAGNOSIS — E785 Hyperlipidemia, unspecified: Secondary | ICD-10-CM | POA: Diagnosis not present

## 2023-06-17 DIAGNOSIS — I10 Essential (primary) hypertension: Secondary | ICD-10-CM | POA: Diagnosis not present

## 2023-06-17 DIAGNOSIS — I951 Orthostatic hypotension: Secondary | ICD-10-CM | POA: Diagnosis not present

## 2023-06-26 DIAGNOSIS — S0003XD Contusion of scalp, subsequent encounter: Secondary | ICD-10-CM | POA: Diagnosis not present

## 2023-06-26 DIAGNOSIS — I10 Essential (primary) hypertension: Secondary | ICD-10-CM | POA: Diagnosis not present

## 2023-06-26 DIAGNOSIS — I951 Orthostatic hypotension: Secondary | ICD-10-CM | POA: Diagnosis not present

## 2023-06-26 DIAGNOSIS — E785 Hyperlipidemia, unspecified: Secondary | ICD-10-CM | POA: Diagnosis not present

## 2023-06-26 DIAGNOSIS — I491 Atrial premature depolarization: Secondary | ICD-10-CM | POA: Diagnosis not present

## 2023-06-26 DIAGNOSIS — I351 Nonrheumatic aortic (valve) insufficiency: Secondary | ICD-10-CM | POA: Diagnosis not present

## 2023-07-02 DIAGNOSIS — I491 Atrial premature depolarization: Secondary | ICD-10-CM | POA: Diagnosis not present

## 2023-07-02 DIAGNOSIS — E785 Hyperlipidemia, unspecified: Secondary | ICD-10-CM | POA: Diagnosis not present

## 2023-07-02 DIAGNOSIS — I951 Orthostatic hypotension: Secondary | ICD-10-CM | POA: Diagnosis not present

## 2023-07-02 DIAGNOSIS — S0003XD Contusion of scalp, subsequent encounter: Secondary | ICD-10-CM | POA: Diagnosis not present

## 2023-07-02 DIAGNOSIS — I351 Nonrheumatic aortic (valve) insufficiency: Secondary | ICD-10-CM | POA: Diagnosis not present

## 2023-07-02 DIAGNOSIS — I10 Essential (primary) hypertension: Secondary | ICD-10-CM | POA: Diagnosis not present

## 2023-07-09 DIAGNOSIS — I10 Essential (primary) hypertension: Secondary | ICD-10-CM | POA: Diagnosis not present

## 2023-07-09 DIAGNOSIS — I491 Atrial premature depolarization: Secondary | ICD-10-CM | POA: Diagnosis not present

## 2023-07-09 DIAGNOSIS — E785 Hyperlipidemia, unspecified: Secondary | ICD-10-CM | POA: Diagnosis not present

## 2023-07-09 DIAGNOSIS — I951 Orthostatic hypotension: Secondary | ICD-10-CM | POA: Diagnosis not present

## 2023-07-09 DIAGNOSIS — S0003XD Contusion of scalp, subsequent encounter: Secondary | ICD-10-CM | POA: Diagnosis not present

## 2023-07-09 DIAGNOSIS — I351 Nonrheumatic aortic (valve) insufficiency: Secondary | ICD-10-CM | POA: Diagnosis not present

## 2023-07-14 DIAGNOSIS — I10 Essential (primary) hypertension: Secondary | ICD-10-CM | POA: Diagnosis not present

## 2023-07-14 DIAGNOSIS — I951 Orthostatic hypotension: Secondary | ICD-10-CM | POA: Diagnosis not present

## 2023-07-14 DIAGNOSIS — E785 Hyperlipidemia, unspecified: Secondary | ICD-10-CM | POA: Diagnosis not present

## 2023-07-14 DIAGNOSIS — I491 Atrial premature depolarization: Secondary | ICD-10-CM | POA: Diagnosis not present

## 2023-07-14 DIAGNOSIS — S0003XD Contusion of scalp, subsequent encounter: Secondary | ICD-10-CM | POA: Diagnosis not present

## 2023-07-14 DIAGNOSIS — I351 Nonrheumatic aortic (valve) insufficiency: Secondary | ICD-10-CM | POA: Diagnosis not present

## 2023-11-23 ENCOUNTER — Other Ambulatory Visit: Payer: Self-pay

## 2023-11-23 ENCOUNTER — Ambulatory Visit
Admission: EM | Admit: 2023-11-23 | Discharge: 2023-11-23 | Disposition: A | Discharge status: Home / Self Care | Attending: Emergency Medicine | Admitting: Emergency Medicine

## 2023-11-23 ENCOUNTER — Encounter: Payer: Self-pay | Admitting: Emergency Medicine

## 2023-11-23 DIAGNOSIS — B029 Zoster without complications: Secondary | ICD-10-CM | POA: Diagnosis not present

## 2023-11-23 MED ORDER — GABAPENTIN 300 MG PO CAPS
300.0000 mg | ORAL_CAPSULE | Freq: Every day | ORAL | 0 refills | Status: AC
Start: 2023-11-23 — End: ?

## 2023-11-23 MED ORDER — VALACYCLOVIR HCL 1 G PO TABS: 1000.0000 mg | ORAL_TABLET | Freq: Three times a day (TID) | ORAL | 0 refills | Status: AC

## 2023-11-23 NOTE — Discharge Instructions (Addendum)
 Valtrex  1000 mg 3 times a day for 7 days for treatment of shingles.  You may use cool compresses on the affected area to help provide comfort.  You may also apply aluminum acetate (found in most anti-itch cream ) lotion to the area to help ease the itching, or use topical Benadryl cream.  Take the gabapentin  300 mg at bedtime to help with pain.  You may also supplement with over-the-counter Tylenol  and/or ibuprofen.  If you continue to have pain after the rash resolves follow-up with your primary care provider for medication options.  Keep the area covered until after the blisters have ruptured and dried up.  Once they have dried up you no longer need to keep a dressing in place.  Avoid contact with unvaccinated children, pregnant women, or those who are immunocompromised or who are taking immune suppressive medication such as Humira, prednisone , methotrexate, or chemotherapy.

## 2023-11-23 NOTE — ED Provider Notes (Signed)
 [facility]    CSN: [id number] Arrival date & time: 11/23/23  0845      History   Chief Complaint Chief Complaint  Patient presents with   Rash    HPI Bonnie Jackson is a 83 y.o. female.   HPI  83 year old female with past medical history significant for CVA with no residual deficits, hypertension, right cataract, brain aneurysm, overactive bladder, GERD, neuropathy, essential hypertension, mixed hyperlipidemia, and idiopathic insomnia presents for evaluation of a red painful rash that started on her right chest and is now spread around to her back on the right side.  She describes the pain as burning.  She reports that the rash developed yesterday.  She denies fever or drainage.  Past Medical History:  Diagnosis Date   Brain aneurysm    Cataract    right   CVA (cerebral infarction)    no deficits   Hypertension     Patient Active Problem List   Diagnosis Date Noted   Syncope and collapse 12/08/2022   Prediabetes 06/01/2020   Carotid artery stenosis 10/16/2018   B12 nutritional deficiency 08/20/2018   Neuropathy 06/06/2017   Hearing loss of right ear 06/06/2017   Gait instability 03/27/2017   Muscle fasciculation 03/27/2017   Gastroesophageal reflux disease 03/27/2017   OAB (overactive bladder) 05/22/2016   Cataract of right eye 01/19/2016   Eversion of the eyelid 01/19/2016   Neoplasm of uncertain behavior of skin 01/19/2016   Thalamic infarction (HCC) 11/16/2015   Aneurysm, cerebral, nonruptured 01/04/2015   Essential (primary) hypertension 01/04/2015   Mixed hyperlipidemia 01/04/2015   Idiopathic insomnia 01/04/2015   Special screening for malignant neoplasms, colon 01/04/2015    Past Surgical History:  Procedure Laterality Date   ABDOMINAL HERNIA REPAIR     CATARACT EXTRACTION W/PHACO Right 06/24/2016   Procedure: CATARACT EXTRACTION PHACO AND INTRAOCULAR LENS PLACEMENT (IOC);  Surgeon: Billee Buddle, MD;  Location: [facility];  Service: Ophthalmology;  Laterality: Right;  RIGHT   CATARACT EXTRACTION W/PHACO Left 10/25/2019   Procedure: CATARACT EXTRACTION PHACO AND INTRAOCULAR LENS PLACEMENT (IOC) LEFT 4.00  00:41.9 ;  Surgeon: Rosa College, MD;  Location: [facility];  Service: Ophthalmology;  Laterality: Left;   CHOLECYSTECTOMY     IR GENERIC HISTORICAL  01/12/2016   IR ANGIO INTRA EXTRACRAN SEL INTERNAL CAROTID BILAT MOD SED 01/12/2016 Augusto Blonder, MD [facility]   IR GENERIC HISTORICAL  01/12/2016   IR ANGIO VERTEBRAL SEL VERTEBRAL BILAT MOD SED 01/12/2016 Augusto Blonder, MD [facility]   IR GENERIC HISTORICAL  01/12/2016   IR 3D INDEPENDENT WKST 01/12/2016 Augusto Blonder, MD [facility]   REPLACEMENT TOTAL KNEE Bilateral    TOTAL ABDOMINAL HYSTERECTOMY      OB History   No obstetric history on file.      Home Medications    Prior to Admission medications   Medication Sig Start Date End Date Taking? Authorizing Provider  gabapentin  (NEURONTIN ) 300 MG capsule Take 1 capsule (300 mg total) by mouth at bedtime. 11/23/23  Yes Kent Pear, NP  valACYclovir  (VALTREX ) 1000 MG tablet Take 1 tablet (1,000 mg total) by mouth 3 (three) times daily. 11/23/23  Yes Kent Pear, NP  aspirin  EC 325 MG tablet Take 325 mg by mouth daily.     [provider]  atorvastatin  (LIPITOR) 40 MG tablet Take 1 tablet by mouth daily. 11/29/22   [provider]  hydrochlorothiazide  (HYDRODIURIL ) 25 MG tablet TAKE 1/2 TABLET BY MOUTH EVERY  DAY 11/28/21   Sheron Dixons, MD  latanoprost  (XALATAN ) 0.005 % ophthalmic solution Place 1 drop into both eyes at bedtime.    [provider]  lisinopril -hydrochlorothiazide  (ZESTORETIC ) 10-12.5 MG tablet TAKE 1 TABLET BY MOUTH EVERY DAY 05/29/22   Berglund, Laura H, MD  metoprolol  succinate (TOPROL -XL) 25 MG 24 hr tablet Take 1 tablet by mouth daily. 10/29/22 10/29/23  [provider]    Family History Family History  Problem  Relation Age of Onset   Breast cancer Mother 70   Heart attack Father    Dementia Sister    Cancer Brother        lung   Cancer Sister        pancreatic   Cancer Brother        lung   Breast cancer Daughter 66    Social History Social History   Tobacco Use   Smoking status: Never   Smokeless tobacco: Never   Tobacco comments:    smoking cessation materials not required  Vaping Use   Vaping status: Never Used  Substance Use Topics   Alcohol use: Yes    Alcohol/week: 1.0 standard drink of alcohol    Types: 1 Cans of beer per week    Comment: occasionally   Drug use: No     Allergies   Patient has no known allergies.   Review of Systems Review of Systems  Constitutional:  Negative for fever.  Skin:  Positive for color change and rash.     Physical Exam Triage Vital Signs ED Triage Vitals  Encounter Vitals Group     BP 11/23/23 0908 133/66     Systolic BP Percentile --      Diastolic BP Percentile --      Pulse Rate 11/23/23 0908 72     Resp 11/23/23 0908 18     Temp 11/23/23 0908 99 F (37.2 C)     Temp Source 11/23/23 0908 Oral     SpO2 11/23/23 0908 97 %     Weight --      Height --      Head Circumference --      Peak Flow --      Pain Score 11/23/23 0909 5     Pain Loc --      Pain Education --      Exclude from Growth Chart --    No data found.  Updated Vital Signs BP 133/66 (BP Location: Left Arm)   Pulse 72   Temp 99 F (37.2 C) (Oral)   Resp 18   SpO2 97%   Visual Acuity Right Eye Distance:   Left Eye Distance:   Bilateral Distance:    Right Eye Near:   Left Eye Near:    Bilateral Near:     Physical Exam Vitals and nursing note reviewed.  Constitutional:      Appearance: Normal appearance.  HENT:     Head: Normocephalic and atraumatic.  Skin:    General: Skin is warm and dry.     Capillary Refill: Capillary refill takes less than 2 seconds.     Findings: Erythema and rash present.  Neurological:     General: No focal  deficit present.     Mental Status: She is alert and oriented to person, place, and time.      UC Treatments / Results  Labs (all labs ordered are listed, but only abnormal results are displayed) Labs Reviewed - No data to display  EKG   Radiology No results found.  Procedures Procedures (including critical care time)  Medications Ordered in UC Medications - No data to display  Initial Impression / Assessment and Plan / UC Course  I have reviewed the triage vital signs and the nursing notes.  Pertinent labs & imaging results that were available during my care of the patient were reviewed by me and considered in my medical decision making (see chart for details).   Patient is a pleasant, nontoxic-appearing 79-year-old female presenting for evaluation of a painful red rash on her right chest as outlined HPI above.     As you can see the images above, the rash is erythematous and maculopapular.  The rash on the chest is starting to become vesicular without drainage.  The patient has had chickenpox but she has not had the shingles vaccine.  This rash is consistent with shingles.  I will discharge her home on Valtrex  1000 mg 3 times daily along with gabapentin  300 mg at bedtime.  I advised her to avoid contact with unvaccinated children, pregnant women, and those were immunocompromised.  Return precautions reviewed.   Final Clinical Impressions(s) / UC Diagnoses   Final diagnoses:  Herpes zoster without complication     Discharge Instructions      Valtrex  1000 mg 3 times a day for 7 days for treatment of shingles.  You may use cool compresses on the affected area to help provide comfort.  You may also apply aluminum acetate (found in most anti-itch cream ) lotion to the area to help ease the itching, or use topical Benadryl cream.  Take the gabapentin  300 mg at bedtime to help with pain.  You may also supplement with over-the-counter Tylenol  and/or ibuprofen.  If you  continue to have pain after the rash resolves follow-up with your primary care provider for medication options.  Keep the area covered until after the blisters have ruptured and dried up.  Once they have dried up you no longer need to keep a dressing in place.  Avoid contact with unvaccinated children, pregnant women, or those who are immunocompromised or who are taking immune suppressive medication such as Humira, prednisone , methotrexate, or chemotherapy.      ED Prescriptions     Medication Sig Dispense Auth. Provider   valACYclovir  (VALTREX ) 1000 MG tablet Take 1 tablet (1,000 mg total) by mouth 3 (three) times daily. 21 tablet Kent Pear, NP   gabapentin  (NEURONTIN ) 300 MG capsule Take 1 capsule (300 mg total) by mouth at bedtime. 30 capsule Kent Pear, NP      PDMP not reviewed this encounter.   Kent Pear, NP 11/23/23 [phone]

## 2023-11-23 NOTE — ED Triage Notes (Signed)
 Pt with painful red rash around right chest that doesn't cross midline; pt sts thinks could be shingles x 3 days
